# Patient Record
Sex: Female | Born: 1935 | Race: White | Hispanic: No | Smoking: Never smoker
Health system: Southern US, Community
[De-identification: ages and names within clinical notes are randomized; demographics above are authoritative.]

## PROBLEM LIST (undated history)

## (undated) DIAGNOSIS — F419 Anxiety disorder, unspecified: Secondary | ICD-10-CM

## (undated) DIAGNOSIS — S060X9A Concussion with loss of consciousness of unspecified duration, initial encounter: Secondary | ICD-10-CM

## (undated) DIAGNOSIS — I319 Disease of pericardium, unspecified: Secondary | ICD-10-CM

## (undated) DIAGNOSIS — C801 Malignant (primary) neoplasm, unspecified: Secondary | ICD-10-CM

## (undated) DIAGNOSIS — R112 Nausea with vomiting, unspecified: Secondary | ICD-10-CM

## (undated) DIAGNOSIS — E069 Thyroiditis, unspecified: Secondary | ICD-10-CM

## (undated) DIAGNOSIS — Z9889 Other specified postprocedural states: Secondary | ICD-10-CM

## (undated) DIAGNOSIS — M1990 Unspecified osteoarthritis, unspecified site: Secondary | ICD-10-CM

## (undated) DIAGNOSIS — C50919 Malignant neoplasm of unspecified site of unspecified female breast: Secondary | ICD-10-CM

## (undated) DIAGNOSIS — S060XAA Concussion with loss of consciousness status unknown, initial encounter: Secondary | ICD-10-CM

## (undated) DIAGNOSIS — E785 Hyperlipidemia, unspecified: Secondary | ICD-10-CM

## (undated) DIAGNOSIS — M810 Age-related osteoporosis without current pathological fracture: Secondary | ICD-10-CM

## (undated) HISTORY — PX: ABDOMINAL SURGERY: SHX537

## (undated) HISTORY — PX: TUBAL LIGATION: SHX77

## (undated) HISTORY — PX: BREAST LUMPECTOMY: SHX2

## (undated) HISTORY — PX: APPENDECTOMY: SHX54

## (undated) HISTORY — PX: NASAL SINUS SURGERY: SHX719

## (undated) HISTORY — PX: ABDOMINAL HYSTERECTOMY: SHX81

## (undated) HISTORY — PX: FISSURECTOMY: SHX5244

---

## 1959-03-29 DIAGNOSIS — I319 Disease of pericardium, unspecified: Secondary | ICD-10-CM

## 1959-03-29 HISTORY — DX: Disease of pericardium, unspecified: I31.9

## 1969-03-28 DIAGNOSIS — E069 Thyroiditis, unspecified: Secondary | ICD-10-CM

## 1969-03-28 HISTORY — DX: Thyroiditis, unspecified: E06.9

## 1988-03-28 HISTORY — PX: NASAL SEPTUM SURGERY: SHX37

## 2000-11-03 ENCOUNTER — Ambulatory Visit (HOSPITAL_COMMUNITY): Admission: RE | Admit: 2000-11-03 | Discharge: 2000-11-03 | Payer: Self-pay | Admitting: Internal Medicine

## 2001-01-09 ENCOUNTER — Ambulatory Visit (HOSPITAL_COMMUNITY): Admission: RE | Admit: 2001-01-09 | Discharge: 2001-01-09 | Payer: Self-pay | Admitting: Internal Medicine

## 2001-01-09 ENCOUNTER — Encounter: Payer: Self-pay | Admitting: Internal Medicine

## 2002-01-23 ENCOUNTER — Ambulatory Visit (HOSPITAL_COMMUNITY): Admission: RE | Admit: 2002-01-23 | Discharge: 2002-01-23 | Payer: Self-pay | Admitting: Internal Medicine

## 2002-01-23 ENCOUNTER — Encounter: Payer: Self-pay | Admitting: Internal Medicine

## 2002-07-24 ENCOUNTER — Ambulatory Visit (HOSPITAL_COMMUNITY): Admission: RE | Admit: 2002-07-24 | Discharge: 2002-07-24 | Payer: Self-pay | Admitting: Internal Medicine

## 2002-07-24 ENCOUNTER — Encounter: Payer: Self-pay | Admitting: Internal Medicine

## 2002-08-08 ENCOUNTER — Other Ambulatory Visit: Admission: RE | Admit: 2002-08-08 | Discharge: 2002-08-08 | Payer: Self-pay | Admitting: Dermatology

## 2003-03-12 ENCOUNTER — Ambulatory Visit (HOSPITAL_COMMUNITY): Admission: RE | Admit: 2003-03-12 | Discharge: 2003-03-12 | Payer: Self-pay | Admitting: Internal Medicine

## 2003-11-01 ENCOUNTER — Inpatient Hospital Stay (HOSPITAL_COMMUNITY): Admission: EM | Admit: 2003-11-01 | Discharge: 2003-11-04 | Payer: Self-pay | Admitting: Emergency Medicine

## 2003-11-13 ENCOUNTER — Ambulatory Visit (HOSPITAL_COMMUNITY): Admission: RE | Admit: 2003-11-13 | Discharge: 2003-11-13 | Payer: Self-pay | Admitting: *Deleted

## 2004-03-17 ENCOUNTER — Ambulatory Visit (HOSPITAL_COMMUNITY): Admission: RE | Admit: 2004-03-17 | Discharge: 2004-03-17 | Payer: Self-pay | Admitting: Internal Medicine

## 2004-07-29 ENCOUNTER — Ambulatory Visit (HOSPITAL_COMMUNITY): Admission: RE | Admit: 2004-07-29 | Discharge: 2004-07-29 | Payer: Self-pay | Admitting: Internal Medicine

## 2005-03-29 ENCOUNTER — Ambulatory Visit (HOSPITAL_COMMUNITY): Admission: RE | Admit: 2005-03-29 | Discharge: 2005-03-29 | Payer: Self-pay | Admitting: Internal Medicine

## 2006-03-30 ENCOUNTER — Ambulatory Visit (HOSPITAL_COMMUNITY): Admission: RE | Admit: 2006-03-30 | Discharge: 2006-03-30 | Payer: Self-pay | Admitting: Internal Medicine

## 2006-04-07 ENCOUNTER — Encounter: Admission: RE | Admit: 2006-04-07 | Discharge: 2006-04-07 | Payer: Self-pay | Admitting: Internal Medicine

## 2006-04-27 ENCOUNTER — Ambulatory Visit (HOSPITAL_COMMUNITY): Admission: RE | Admit: 2006-04-27 | Discharge: 2006-04-27 | Payer: Self-pay | Admitting: Internal Medicine

## 2007-03-29 HISTORY — PX: LAPAROSCOPIC DISTAL PANCREATECTOMY: SHX1922

## 2007-05-10 ENCOUNTER — Ambulatory Visit (HOSPITAL_COMMUNITY): Admission: RE | Admit: 2007-05-10 | Discharge: 2007-05-10 | Payer: Self-pay | Admitting: Internal Medicine

## 2008-05-14 ENCOUNTER — Ambulatory Visit (HOSPITAL_COMMUNITY): Admission: RE | Admit: 2008-05-14 | Discharge: 2008-05-14 | Payer: Self-pay | Admitting: Internal Medicine

## 2009-05-18 ENCOUNTER — Ambulatory Visit (HOSPITAL_COMMUNITY): Admission: RE | Admit: 2009-05-18 | Discharge: 2009-05-18 | Payer: Self-pay | Admitting: Internal Medicine

## 2009-08-05 ENCOUNTER — Ambulatory Visit (HOSPITAL_COMMUNITY): Admission: RE | Admit: 2009-08-05 | Discharge: 2009-08-05 | Payer: Self-pay | Admitting: Internal Medicine

## 2009-09-14 ENCOUNTER — Encounter: Admission: RE | Admit: 2009-09-14 | Discharge: 2009-09-14 | Payer: Self-pay | Admitting: General Surgery

## 2010-04-18 ENCOUNTER — Encounter: Payer: Self-pay | Admitting: Internal Medicine

## 2010-05-31 ENCOUNTER — Other Ambulatory Visit (HOSPITAL_COMMUNITY): Payer: Self-pay | Admitting: Internal Medicine

## 2010-05-31 DIAGNOSIS — Z139 Encounter for screening, unspecified: Secondary | ICD-10-CM

## 2010-06-03 ENCOUNTER — Ambulatory Visit (HOSPITAL_COMMUNITY)
Admission: RE | Admit: 2010-06-03 | Discharge: 2010-06-03 | Disposition: A | Discharge status: Home / Self Care | Payer: Medicare Other | Source: Ambulatory Visit | Attending: Internal Medicine | Admitting: Internal Medicine

## 2010-06-03 DIAGNOSIS — Z139 Encounter for screening, unspecified: Secondary | ICD-10-CM

## 2010-06-03 DIAGNOSIS — Z1231 Encounter for screening mammogram for malignant neoplasm of breast: Secondary | ICD-10-CM | POA: Insufficient documentation

## 2010-08-13 NOTE — Discharge Summary (Signed)
NAME:  Caitlin Alexander, Caitlin Alexander                          ACCOUNT NO.:  [id number]   MEDICAL RECORD NO.:  [id number]                   PATIENT TYPE:  INP   LOCATION:  A211                                 FACILITY:  [facility]   PHYSICIAN:  Kingsley Callander. Ouida Sills, M.D.                  DATE OF BIRTH:  [birth year]   DATE OF ADMISSION:  10/31/2003  DATE OF DISCHARGE:  11/04/2003                                 DISCHARGE SUMMARY   DISCHARGE DIAGNOSES:  1. Palpitations.  2. Abnormal electrocardiogram.  3. Hyperlipidemia.  4. Anxiety.  5. Gastroesophageal reflux disease.  6. Osteoporosis.   PROCEDURE:  Echocardiogram.   HOSPITAL COURSE:  This patient is a 75 year old white female who presented  to the emergency room with heart palpitations. She was found to have an  abnormal EKG. She was in normal sinus rhythm, but had marked anteroseptal T-  wave inversions. She was hospitalized in a monitored setting. Cardiac  enzymes were negative. She was treated with metoprolol. She was seen in  cardiology consultation by Dr. Wendee Copp.  She had previously had a history  of pericarditis. She underwent an echocardiogram which revealed normal left  ventricular function and no pericardial effusion. She was set up for an  outpatient and was felt to be stable for discharge on November 04, 2003.   It is felt that her EKG changes are her baseline.   She has a significant degree of anxiety. She has been treated with lorazepam  chronically. She was euthyroid with a TSH of 3.3.   DISCHARGE MEDICATIONS:  1. Lorazepam 2 mg t.i.d.  2. Mevacor 20 mg q.h.s.  3. Prevacid and Reglan p.r.n.  4. Viactiv b.i.d.  5. Colace b.i.d.  6. Lopressor 50 mg one-half b.i.d. p.r.n.   FOLLOWUP:  The patient will be seen in my office in two weeks. She will be  set up for an outpatient stress test.     ___________________________________________                                         Kingsley Callander. Ouida Sills, M.D.   ROF/MEDQ  D:  11/06/2003  T:  11/06/2003   Job:  [id number]

## 2010-08-13 NOTE — Procedures (Signed)
NAME:  Caitlin Alexander, Caitlin Alexander                          ACCOUNT NO.:  [id number]   MEDICAL RECORD NO.:  [id number]                   PATIENT TYPE:  REC   LOCATION:  RAD                                  FACILITY:  [facility]   PHYSICIAN:  Dani Gobble, MD                    DATE OF BIRTH:  June 21, 1935   DATE OF PROCEDURE:  11/13/2003  DATE OF DISCHARGE:                                    STRESS TEST   PROCEDURE:  Stress portion of an adenosine Cardiolite.   REFERRING PHYSICIAN:  Kingsley Callander. Ouida Sills, M.D.   SURGEON:  Sherral Hammers, M.D.   INDICATIONS:  Ms. Olinde has had __________ palpitations with some chest  heaviness as well.  She is referred for ischemic workup today.   Baseline 12-lead EKG reveals normal sinus rhythm at approximately 80  beats/minute.  She has subtle mild T wave inversion in the precordial leads  particularly in V4-V6 which are unchanged from prior several years ago.  Baseline blood pressure 130/80 mmHg.   The patient was infused with adenosine per protocol.  She experienced mild  abdominal sensation.  This resolved by testing.  EKG revealed no further  changes during her procedure.   IMPRESSION:  1. Clinically negative for angina.  2. EKG negative for ischemia.  3. Scintigraphic images are pending.      ___________________________________________                                            Dani Gobble, MD   AB/MEDQ  D:  11/13/2003  T:  11/14/2003  Job:  [id number]   cc:   Kingsley Callander. Ouida Sills, M.D.  [address]  [place]  [place] [address]  Fax: [phone]   Dani Gobble, MD  Fax: [phone]

## 2010-08-13 NOTE — Procedures (Signed)
NAME:  Caitlin Alexander, Caitlin Alexander                          ACCOUNT NO.:  [id number]   MEDICAL RECORD NO.:  [id number]                   PATIENT TYPE:  INP   LOCATION:  A211                                 FACILITY:  [facility]   PHYSICIAN:  Dani Gobble, MD                    DATE OF BIRTH:  1936-01-06   DATE OF PROCEDURE:  11/04/2003  DATE OF DISCHARGE:                                  ECHOCARDIOGRAM   INDICATIONS:  Ms. Longmire is a 75 year old female with GERD and abnormal EKG  and a remote history of pericarditis who is admitted with palpitations.   Technical quality of the study is adequate.   The aorta is within normal limits at 2.7 cm.   The left atrium is within normal limits at 3.8 cm.   The intraventricular septum is mildly thickened at 1.3 cm. The posterior  wall is within normal limits at 0.9 cm.   The aortic valve appeared to be structurally normal with normal leaflet  excursion. No significant aortic insufficiency is noted.   The aortic valve leaflet excursion was normal.   The mitral valve also appeared structurally normal with no mitral valve  prolapse noted. No significant mitral regurgitation was noted. Doppler  interrogation of the mitral valve was within normal limits.   Pulmonic valve was incompletely visualized.   Tricuspid valve appeared grossly structurally normal with trivial tricuspid  regurgitation noted.   Left ventricle was normal in size with normal overall left ventricular  systolic function and no regional wall motion abnormalities noted. The right  atrium and right ventricle appeared normal in size with normal right  ventricular systolic function. No pericardial effusion was appreciated on  this study.   IMPRESSION:  1. Mild asymmetric septal hypertrophy.  2. No significant valvular abnormality appreciated.  3. Trivial tricuspid regurgitation.  4. Normal left ventricular size and systolic function without regional wall     motion abnormalities noted.  5.  No pericardial effusion.      ___________________________________________                                            Dani Gobble, MD   AB/MEDQ  D:  11/04/2003  T:  11/04/2003  Job:  [id number]

## 2010-08-13 NOTE — H&P (Signed)
NAME:  Caitlin Alexander, Caitlin Alexander                          ACCOUNT NO.:  [id number]   MEDICAL RECORD NO.:  [id number]                   PATIENT TYPE:  INP   LOCATION:  IC10                                 FACILITY:  [facility]   PHYSICIAN:  Melvyn Novas, MD        DATE OF BIRTH:  04-10-1935   DATE OF ADMISSION:  10/31/2003  DATE OF DISCHARGE:                                HISTORY & PHYSICAL   HISTORY OF PRESENT ILLNESS:  The patient is a 75 year old white female  patient of Dr. Alonza Smoker with severe hyperlipidemia over 300 currently on  Mevacor at 22 with a history of anxiety who apparently had flip-flopping of  her heart for the past 4-5 days, it became a little worse today.  She was  seen in the ER, was found to be in sinus rhythm and however, had profound ST  and T wave abnormalities of the anterior precordial leads, possibly  consistent with either anterior ischemia or posterior wall reciprocal  changes.  She specifically denied anginal chest pain, orthopnea, or PND.  She did notice some increased gas over the last few days and indigestion but  specifically denies angina or angina equivalence.  Her initial cardiac  enzymes were negative and she is admitted for the profound EKG changes in  the face of severe hyperlipidemia over time.   PAST MEDICAL HISTORY:  1. Hyperlipidemia.  2. GERD.  3. Anxiety.  4. Depression.  5. Diverticulosis.  6. Osteoporosis.   PAST SURGICAL HISTORY:  1. Appendectomy.  2. Vaginal hysterectomy.  3. Anal fissure.  4. Nasal septum repair.   ALLERGIES:  She has allergies to PENICILLIN, SULFUR, and MACRODANTIN.   CURRENT MEDICINES:  1. Mevacor 20.  2. Ativan 2 mg t.i.d.   SOCIAL HISTORY:  She is a nonsmoker.   FAMILY HISTORY:  She had six siblings; one brother died of an MI at 50, the  other six siblings are alive.  No early heart attacks.  Her father died at  26 and mother died at 21.   PHYSICAL EXAMINATION:  VITAL SIGNS:  Blood pressure 130/78,  pulse of 61,  respiratory rate is 18, O2 saturation is 99%.  EYES:  PERRLA.  Extraocular movements intact.  Sclerae clear.  Conjunctivae  pink.  NECK:  No jugular venous distention, no carotid bruits, no thyromegaly or  thyroid bruits.  LUNGS:  Clear to A&P.  No rales, wheezes, or rhonchi.  HEART:  Regular rhythm.  No murmurs, gallops, heaves, thrills, or rubs.  ABDOMEN:  Soft, nontender, bowel sounds normoactive.  No guarding or  rebound, no mass, no megaly.  EXTREMITIES:  No clubbing, cyanosis, or edema.  NEUROLOGIC:  Cranial nerves II-XII are grossly intact.  Patient moves all  four extremities.  Plantars are downgoing.   IMPRESSION:  1. Intermittent palpitations, possible supraventricular tachycardia versus     paroxysmal atrial fibrillation.  2. Profound ST and T wave abnormalities which are in the anterior  precordial     leads.  Rule out ischemia.  3. Severe hyperlipidemia.   PLAN:  Plan is to admit to ICU, place on Lovenox 1 mg/kg subcu q.12h.,  aspirin 325 once daily, IV nitroglycerin and Lopressor 25 b.i.d.  We will  obtain lipid profile in the morning and schedule a Cardiolite scan as her  last one was done 13 years ago.     ___________________________________________                                         Melvyn Novas, MD   RMD/MEDQ  D:  11/01/2003  T:  11/01/2003  Job:  [id number]

## 2011-07-18 ENCOUNTER — Other Ambulatory Visit (HOSPITAL_COMMUNITY): Payer: Self-pay | Admitting: Internal Medicine

## 2011-07-18 DIAGNOSIS — Z139 Encounter for screening, unspecified: Secondary | ICD-10-CM

## 2011-07-19 ENCOUNTER — Ambulatory Visit (HOSPITAL_COMMUNITY)
Admission: RE | Admit: 2011-07-19 | Discharge: 2011-07-19 | Disposition: A | Discharge status: Home / Self Care | Payer: Medicare Other | Source: Ambulatory Visit | Attending: Internal Medicine | Admitting: Internal Medicine

## 2011-07-19 DIAGNOSIS — Z139 Encounter for screening, unspecified: Secondary | ICD-10-CM

## 2011-07-19 DIAGNOSIS — Z1231 Encounter for screening mammogram for malignant neoplasm of breast: Secondary | ICD-10-CM | POA: Insufficient documentation

## 2011-10-20 ENCOUNTER — Other Ambulatory Visit (HOSPITAL_COMMUNITY): Payer: Self-pay | Admitting: Internal Medicine

## 2011-10-20 DIAGNOSIS — D490 Neoplasm of unspecified behavior of digestive system: Secondary | ICD-10-CM

## 2011-10-26 ENCOUNTER — Ambulatory Visit (HOSPITAL_COMMUNITY)
Admission: RE | Admit: 2011-10-26 | Discharge: 2011-10-26 | Disposition: A | Discharge status: Home / Self Care | Payer: Medicare Other | Source: Ambulatory Visit | Attending: Internal Medicine | Admitting: Internal Medicine

## 2011-10-26 ENCOUNTER — Encounter (HOSPITAL_COMMUNITY): Payer: Self-pay | Admitting: *Deleted

## 2011-10-26 ENCOUNTER — Inpatient Hospital Stay (HOSPITAL_COMMUNITY)
Admission: EM | Admit: 2011-10-26 | Discharge: 2011-11-09 | DRG: 329 | Disposition: A | Discharge status: Other Acute Inpatient Hospital | Payer: Medicare Other | Attending: General Surgery | Admitting: General Surgery

## 2011-10-26 DIAGNOSIS — E876 Hypokalemia: Secondary | ICD-10-CM | POA: Diagnosis not present

## 2011-10-26 DIAGNOSIS — E871 Hypo-osmolality and hyponatremia: Secondary | ICD-10-CM | POA: Diagnosis not present

## 2011-10-26 DIAGNOSIS — F411 Generalized anxiety disorder: Secondary | ICD-10-CM | POA: Diagnosis present

## 2011-10-26 DIAGNOSIS — Z90410 Acquired total absence of pancreas: Secondary | ICD-10-CM

## 2011-10-26 DIAGNOSIS — D490 Neoplasm of unspecified behavior of digestive system: Secondary | ICD-10-CM | POA: Insufficient documentation

## 2011-10-26 DIAGNOSIS — K5900 Constipation, unspecified: Secondary | ICD-10-CM | POA: Diagnosis present

## 2011-10-26 DIAGNOSIS — Y832 Surgical operation with anastomosis, bypass or graft as the cause of abnormal reaction of the patient, or of later complication, without mention of misadventure at the time of the procedure: Secondary | ICD-10-CM | POA: Diagnosis not present

## 2011-10-26 DIAGNOSIS — E785 Hyperlipidemia, unspecified: Secondary | ICD-10-CM | POA: Diagnosis present

## 2011-10-26 DIAGNOSIS — M810 Age-related osteoporosis without current pathological fracture: Secondary | ICD-10-CM | POA: Diagnosis present

## 2011-10-26 DIAGNOSIS — Z882 Allergy status to sulfonamides status: Secondary | ICD-10-CM

## 2011-10-26 DIAGNOSIS — K56609 Unspecified intestinal obstruction, unspecified as to partial versus complete obstruction: Principal | ICD-10-CM | POA: Diagnosis present

## 2011-10-26 DIAGNOSIS — D72829 Elevated white blood cell count, unspecified: Secondary | ICD-10-CM | POA: Diagnosis not present

## 2011-10-26 DIAGNOSIS — R933 Abnormal findings on diagnostic imaging of other parts of digestive tract: Secondary | ICD-10-CM | POA: Insufficient documentation

## 2011-10-26 DIAGNOSIS — I319 Disease of pericardium, unspecified: Secondary | ICD-10-CM | POA: Diagnosis present

## 2011-10-26 DIAGNOSIS — J189 Pneumonia, unspecified organism: Secondary | ICD-10-CM | POA: Diagnosis not present

## 2011-10-26 DIAGNOSIS — S98119A Complete traumatic amputation of unspecified great toe, initial encounter: Secondary | ICD-10-CM

## 2011-10-26 DIAGNOSIS — Z881 Allergy status to other antibiotic agents status: Secondary | ICD-10-CM

## 2011-10-26 DIAGNOSIS — Y9210 Unspecified residential institution as the place of occurrence of the external cause: Secondary | ICD-10-CM | POA: Diagnosis not present

## 2011-10-26 DIAGNOSIS — Z9089 Acquired absence of other organs: Secondary | ICD-10-CM | POA: Insufficient documentation

## 2011-10-26 DIAGNOSIS — Z888 Allergy status to other drugs, medicaments and biological substances status: Secondary | ICD-10-CM

## 2011-10-26 DIAGNOSIS — Z90411 Acquired partial absence of pancreas: Secondary | ICD-10-CM | POA: Insufficient documentation

## 2011-10-26 DIAGNOSIS — Z09 Encounter for follow-up examination after completed treatment for conditions other than malignant neoplasm: Secondary | ICD-10-CM | POA: Insufficient documentation

## 2011-10-26 DIAGNOSIS — K560 Paralytic ileus: Secondary | ICD-10-CM | POA: Diagnosis not present

## 2011-10-26 DIAGNOSIS — K929 Disease of digestive system, unspecified: Secondary | ICD-10-CM | POA: Diagnosis not present

## 2011-10-26 HISTORY — DX: Anxiety disorder, unspecified: F41.9

## 2011-10-26 HISTORY — DX: Age-related osteoporosis without current pathological fracture: M81.0

## 2011-10-26 HISTORY — DX: Hyperlipidemia, unspecified: E78.5

## 2011-10-26 HISTORY — DX: Unspecified osteoarthritis, unspecified site: M19.90

## 2011-10-26 LAB — COMPREHENSIVE METABOLIC PANEL
ALT: 143 U/L — ABNORMAL HIGH (ref 0–35)
Alkaline Phosphatase: 221 U/L — ABNORMAL HIGH (ref 39–117)
BUN: 10 mg/dL (ref 6–23)
CO2: 25 mEq/L (ref 19–32)
Calcium: 10 mg/dL (ref 8.4–10.5)
Chloride: 91 mEq/L — ABNORMAL LOW (ref 96–112)
Creatinine, Ser: 0.75 mg/dL (ref 0.50–1.10)
GFR calc non Af Amer: 81 mL/min — ABNORMAL LOW (ref >90)
Glucose, Bld: 135 mg/dL — ABNORMAL HIGH (ref 70–99)
Potassium: 3.9 mEq/L (ref 3.5–5.1)

## 2011-10-26 LAB — CBC WITH DIFFERENTIAL/PLATELET
Basophils Relative: 0 % (ref 0–1)
Eosinophils Relative: 0 % (ref 0–5)
Hemoglobin: 13.7 g/dL (ref 12.0–15.0)
Lymphocytes Relative: 13 % (ref 12–46)
Lymphs Abs: 2.2 10*3/uL (ref 0.7–4.0)
MCH: 31.2 pg (ref 26.0–34.0)
MCHC: 34.5 g/dL (ref 30.0–36.0)
Monocytes Relative: 8 % (ref 3–12)
RBC: 4.39 MIL/uL (ref 3.87–5.11)

## 2011-10-26 MED ORDER — SODIUM CHLORIDE 0.9 % IV BOLUS (SEPSIS)
1000.0000 mL | Freq: Once | INTRAVENOUS | Status: AC
  Administered 2011-10-26: 1000 mL via INTRAVENOUS

## 2011-10-26 MED ORDER — PANTOPRAZOLE SODIUM 40 MG IV SOLR
40.0000 mg | Freq: Every day | INTRAVENOUS | Status: DC
  Administered 2011-10-27 – 2011-11-04 (×9): 40 mg via INTRAVENOUS
  Filled 2011-10-26 (×9): qty 40

## 2011-10-26 MED ORDER — PANTOPRAZOLE SODIUM 40 MG IV SOLR
40.0000 mg | Freq: Once | INTRAVENOUS | Status: AC
  Administered 2011-10-26: 40 mg via INTRAVENOUS
  Filled 2011-10-26: qty 40

## 2011-10-26 MED ORDER — LACTATED RINGERS IV SOLN
INTRAVENOUS | Status: DC
  Administered 2011-10-26 – 2011-11-02 (×14): via INTRAVENOUS
  Administered 2011-11-02: 1000 mL via INTRAVENOUS
  Administered 2011-11-03 (×2): via INTRAVENOUS

## 2011-10-26 MED ORDER — ONDANSETRON HCL 4 MG/2ML IJ SOLN
4.0000 mg | Freq: Four times a day (QID) | INTRAMUSCULAR | Status: DC | PRN
  Administered 2011-10-26 – 2011-10-31 (×4): 4 mg via INTRAVENOUS
  Filled 2011-10-26 (×4): qty 2

## 2011-10-26 MED ORDER — ENOXAPARIN SODIUM 40 MG/0.4ML ~~LOC~~ SOLN
40.0000 mg | SUBCUTANEOUS | Status: DC
  Administered 2011-10-26 – 2011-11-07 (×13): 40 mg via SUBCUTANEOUS
  Filled 2011-10-26 (×13): qty 0.4

## 2011-10-26 MED ORDER — IOHEXOL 300 MG/ML  SOLN
100.0000 mL | Freq: Once | INTRAMUSCULAR | Status: AC | PRN
  Administered 2011-10-26: 100 mL via INTRAVENOUS

## 2011-10-26 MED ORDER — ONDANSETRON HCL 4 MG/2ML IJ SOLN
4.0000 mg | Freq: Once | INTRAMUSCULAR | Status: AC
  Administered 2011-10-26: 4 mg via INTRAVENOUS
  Filled 2011-10-26: qty 2

## 2011-10-26 MED ORDER — MORPHINE SULFATE 2 MG/ML IJ SOLN
1.0000 mg | INTRAMUSCULAR | Status: DC | PRN
  Administered 2011-10-26 – 2011-10-27 (×3): 4 mg via INTRAVENOUS
  Administered 2011-10-27: 2 mg via INTRAVENOUS
  Administered 2011-10-27 – 2011-10-28 (×3): 4 mg via INTRAVENOUS
  Administered 2011-10-29 – 2011-10-31 (×2): 2 mg via INTRAVENOUS
  Filled 2011-10-26 (×2): qty 2
  Filled 2011-10-26: qty 1
  Filled 2011-10-26 (×5): qty 2
  Filled 2011-10-26: qty 1
  Filled 2011-10-26: qty 2

## 2011-10-26 MED ORDER — HYDROMORPHONE HCL PF 1 MG/ML IJ SOLN
1.0000 mg | Freq: Once | INTRAMUSCULAR | Status: AC
  Administered 2011-10-26: 1 mg via INTRAVENOUS
  Filled 2011-10-26: qty 1

## 2011-10-26 NOTE — ED Provider Notes (Signed)
History     CSN: [id number]  Arrival date & time 10/26/11  1857   First MD Initiated Contact with Patient 10/26/11 1952      Chief Complaint  Patient presents with  . Abdominal Pain    (Consider location/radiation/quality/duration/timing/severity/associated sxs/prior treatment) Patient is a 76 y.o. female presenting with abdominal pain. The history is provided by the patient (pt complains of abd pain and vomiting). No language interpreter was used.  Abdominal Pain The primary symptoms of the illness include abdominal pain and vomiting. The primary symptoms of the illness do not include fatigue or diarrhea. The current episode started yesterday. The onset of the illness was gradual. The problem has not changed since onset. Associated with: nothing. The patient states that she believes she is currently not pregnant. The patient has not had a change in bowel habit. Additional symptoms associated with the illness include urgency. Symptoms associated with the illness do not include hematuria, frequency or back pain. Significant associated medical issues do not include sickle cell disease.    History reviewed. No pertinent past medical history.  Past Surgical History  Procedure Date  . Abdominal surgery     No family history on file.  History  Substance Use Topics  . Smoking status: Never Smoker   . Smokeless tobacco: Not on file  . Alcohol Use: No    OB History    Grav Para Term Preterm Abortions TAB SAB Ect Mult Living                  Review of Systems  Constitutional: Negative for fatigue.  HENT: Negative for congestion, sinus pressure and ear discharge.   Eyes: Negative for discharge.  Respiratory: Negative for cough.   Cardiovascular: Negative for chest pain.  Gastrointestinal: Positive for vomiting and abdominal pain. Negative for diarrhea.  Genitourinary: Positive for urgency. Negative for frequency and hematuria.  Musculoskeletal: Negative for back pain.  Skin:  Negative for rash.  Neurological: Negative for seizures and headaches.  Hematological: Negative.   Psychiatric/Behavioral: Negative for hallucinations.    Allergies  Macrodantin; Promethazine; Sulfa antibiotics; and Suprax  Home Medications   Current Outpatient Rx  Name Route Sig Dispense Refill  . ACETAMINOPHEN 500 MG PO TABS Oral Take 500 mg by mouth daily as needed. For pain    . VICODIN PO Oral Take 0.5-1 tablets by mouth once as needed. For pain    . LANSOPRAZOLE 30 MG PO CPDR Oral Take 30 mg by mouth once as needed.    Marland Kitchen LORAZEPAM 2 MG PO TABS Oral Take 2 mg by mouth 3 (three) times daily.     Marland Kitchen LOVASTATIN 20 MG PO TABS Oral Take 20 mg by mouth every evening.     Marland Kitchen POLYETHYLENE GLYCOL 3350 PO PACK Oral Take 17 g by mouth every morning.      BP 118/71  Pulse 65  Temp 98.7 F (37.1 C) (Oral)  Resp 18  Ht 5\' 2"  (1.575 m)  Wt 135 lb (61.236 kg)  BMI 24.69 kg/m2  SpO2 95%  Physical Exam  Constitutional: She is oriented to person, place, and time. She appears well-developed.  HENT:  Head: Normocephalic and atraumatic.  Eyes: Conjunctivae and EOM are normal. No scleral icterus.  Neck: Neck supple. No thyromegaly present.  Cardiovascular: Normal rate and regular rhythm.  Exam reveals no gallop and no friction rub.   No murmur heard. Pulmonary/Chest: No stridor. She has no wheezes. She has no rales. She exhibits no tenderness.  Abdominal: She exhibits no distension. There is tenderness. There is no rebound.  Musculoskeletal: Normal range of motion. She exhibits no edema.  Lymphadenopathy:    She has no cervical adenopathy.  Neurological: She is oriented to person, place, and time. Coordination normal.  Skin: No rash noted. No erythema.  Psychiatric: She has a normal mood and affect. Her behavior is normal.    ED Course  Procedures (including critical care time)  Labs Reviewed  CBC WITH DIFFERENTIAL - Abnormal; Notable for the following:    WBC 16.3 (*)      Neutrophils Relative 79 (*)     Neutro Abs 12.9 (*)     Monocytes Absolute 1.2 (*)     All other components within normal limits  COMPREHENSIVE METABOLIC PANEL - Abnormal; Notable for the following:    Sodium 129 (*)     Chloride 91 (*)     Glucose, Bld 135 (*)     AST 50 (*)     ALT 143 (*)     Alkaline Phosphatase 221 (*)     GFR calc non Af Amer 81 (*)     All other components within normal limits  LIPASE, BLOOD   Ct Abdomen W Contrast  10/26/2011  *RADIOLOGY REPORT*  Clinical Data: Follow-up distal pancreatectomy and splenectomy.  CT ABDOMEN WITH CONTRAST  Technique:  Multidetector CT imaging of the abdomen was performed following the standard protocol during bolus administration of intravenous contrast.  Contrast: OMNIPAQUE IOHEXOL 300 MG/ML  SOLN  Comparison: 08/05/2009.  Findings: The lung bases are clear.  No pleural effusion or pulmonary nodule.  There is a stable hepatic cyst.  No worrisome hepatic lesions or intrahepatic biliary dilatation.  The gallbladder is normal.  No common bile duct dilatation.  Stable surgical changes from a partial distal pancreatectomy.  No complicating features are demonstrated.  The spleen has also been surgically removed.  The adrenal glands and kidneys are unremarkable and stable.  The stomach as well distended.  No abnormalities are seen.  The third portion of the duodenum demonstrates moderate wall thickening and mild inflammatory change.  Findings suggest focal duodenitis.  The small bowel demonstrates moderate dilatation with normal/decompressed appearing distal small bowel loops.  Findings could be due to a mid distal small bowel obstruction.  The pelvis is not included.  The colon relatively decompressed.  The aorta is normal in caliber.  No dissection.  The bony structures unremarkable.  IMPRESSION:  1.  Stable surgical changes from a distal pancreatectomy and splenectomy. 2.  Wall thickening and inflammation of the third portion of the duodenum  suggesting duodenitis. 3.  Dilated small bowel loops with normal/decompressed distal small bowel loops suggesting a small bowel obstruction.  Transition point is not identified but the pelvis is not included.  Would recommend clinical correlation and follow-up radiographs is indicated.  Original Report Authenticated By: P. Loralie Champagne, M.D.     1. SBO (small bowel obstruction)       Date: 10/26/2011  Rate:99  Rhythm: normal sinus rhythm  QRS Axis: left  Intervals: normal  ST/T Wave abnormalities: nonspecific ST changes  Conduction Disutrbances:none  Narrative Interpretation:   Old EKG Reviewed: none available   MDM  The chart was scribed for me under my direct supervision.  I personally performed the history, physical, and medical decision making and all procedures in the evaluation of this patient.Benny Lennert, MD 10/26/11 2128

## 2011-10-26 NOTE — ED Notes (Addendum)
C/o diffuse abd pain, describes as pressure being sharp at times x 1 week.  Pt saw her PCP on Thursday for same and had CT abd/pelvis done today.  Reports onset of n/v last night.

## 2011-10-26 NOTE — ED Notes (Signed)
C/o nausea; actively vomiting; medicated per orders.

## 2011-10-26 NOTE — ED Notes (Signed)
States she has abdominal pain and vomiting for a week, states she had a ct scan of abdomen earlier today, states she is vomiting now and unable to keep fluids down

## 2011-10-27 ENCOUNTER — Encounter (HOSPITAL_COMMUNITY): Payer: Self-pay | Admitting: Unknown Physician Specialty

## 2011-10-27 LAB — CBC
MCH: 30.8 pg (ref 26.0–34.0)
MCHC: 33.8 g/dL (ref 30.0–36.0)
RDW: 13.3 % (ref 11.5–15.5)

## 2011-10-27 LAB — BASIC METABOLIC PANEL
BUN: 11 mg/dL (ref 6–23)
Creatinine, Ser: 0.78 mg/dL (ref 0.50–1.10)
GFR calc Af Amer: >90 mL/min (ref >90)
GFR calc non Af Amer: 80 mL/min — ABNORMAL LOW (ref >90)

## 2011-10-27 MED ORDER — METOCLOPRAMIDE HCL 5 MG/ML IJ SOLN
10.0000 mg | Freq: Once | INTRAMUSCULAR | Status: AC
  Administered 2011-10-27: 10 mg via INTRAVENOUS
  Filled 2011-10-27: qty 2

## 2011-10-27 MED ORDER — HYDROMORPHONE HCL PF 1 MG/ML IJ SOLN
1.0000 mg | INTRAMUSCULAR | Status: DC | PRN
  Administered 2011-10-27 – 2011-10-31 (×21): 1 mg via INTRAVENOUS
  Filled 2011-10-27 (×8): qty 1
  Filled 2011-10-27: qty 2
  Filled 2011-10-27 (×4): qty 1
  Filled 2011-10-27: qty 2
  Filled 2011-10-27 (×7): qty 1

## 2011-10-27 NOTE — Progress Notes (Signed)
UR Chart Review Completed  

## 2011-10-27 NOTE — H&P (Signed)
Caitlin Alexander is an 76 y.o. female.   Chief Complaint: Nausea vomiting and abdominal pain HPI: Patient states she started developing symptoms approximately one week ago with increasing nausea and episodes of nonbloody emesis. She has had some diffuse colicky abdominal pain as described. Occasionally this is been sharp stabbing. She has had intermittent bowel function over the last week although her last bowel movement was several days ago. She had been worked up as an outpatient by her PCP. A CT of her upper abdomen was obtained and demonstrated evidence of distended loops of small intestine suspicious for small bowel obstruction. She did presents the emergency department after this finding. Her last bowel movement was approximately 2 days ago. Again it was reported as normal with no melena or hematochezia. No history of diarrhea. She has had some nausea and chills associated with the nausea. Nausea is currently better with bowel rest. She is unsure whether she is passing flatus. No changes with urination. No similar symptomatology in the past. 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 She's also had a abdominal hysterectomy and appendectomy.  History reviewed. No pertinent past medical history.  Past Surgical History  Procedure Date  . Abdominal surgery   . Appendectomy   . Abdominal hysterectomy     History reviewed. No pertinent family history. Social History:  reports that she has never smoked. She does not have any smokeless tobacco history on file. She reports that she does not drink alcohol or use illicit drugs.  Allergies:  Allergies  Allergen Reactions  . Macrodantin (Nitrofurantoin Macrocrystal)   . Promethazine   . Sulfa Antibiotics   . Suprax (Cefixime)     Medications Prior to Admission  Medication Sig Dispense Refill  . acetaminophen (TYLENOL) 500 MG tablet Take 500 mg by mouth daily as  needed. For pain      . Hydrocodone-Acetaminophen (VICODIN PO) Take 0.5-1 tablets by mouth once as needed. For pain      . lansoprazole (PREVACID) 30 MG capsule Take 30 mg by mouth once as needed.      Marland Kitchen LORazepam (ATIVAN) 2 MG tablet Take 2 mg by mouth 3 (three) times daily.       Marland Kitchen lovastatin (MEVACOR) 20 MG tablet Take 20 mg by mouth every evening.       . polyethylene glycol (MIRALAX / GLYCOLAX) packet Take 17 g by mouth every morning.        Results for orders placed during the hospital encounter of 10/26/11 (from the past 48 hour(s))  CBC WITH DIFFERENTIAL     Status: Abnormal   Collection Time   10/26/11  7:58 PM      Component Value Range Comment   WBC 16.3 (*) 4.0 - 10.5 K/uL    RBC 4.39  3.87 - 5.11 MIL/uL    Hemoglobin 13.7  12.0 - 15.0 g/dL    HCT 78.2  95.6 - 21.3 %    MCV 90.4  78.0 - 100.0 fL    MCH 31.2  26.0 - 34.0 pg    MCHC 34.5  30.0 - 36.0 g/dL    RDW 08.6  57.8 - 46.9 %    Platelets 396  150 - 400 K/uL    Neutrophils Relative 79 (*) 43 - 77 %    Neutro Abs 12.9 (*) 1.7 - 7.7 K/uL    Lymphocytes Relative 13  12 - 46 %  Lymphs Abs 2.2  0.7 - 4.0 K/uL    Monocytes Relative 8  3 - 12 %    Monocytes Absolute 1.2 (*) 0.1 - 1.0 K/uL    Eosinophils Relative 0  0 - 5 %    Eosinophils Absolute 0.0  0.0 - 0.7 K/uL    Basophils Relative 0  0 - 1 %    Basophils Absolute 0.0  0.0 - 0.1 K/uL   COMPREHENSIVE METABOLIC PANEL     Status: Abnormal   Collection Time   10/26/11  7:58 PM      Component Value Range Comment   Sodium 129 (*) 135 - 145 mEq/L    Potassium 3.9  3.5 - 5.1 mEq/L    Chloride 91 (*) 96 - 112 mEq/L    CO2 25  19 - 32 mEq/L    Glucose, Bld 135 (*) 70 - 99 mg/dL    BUN 10  6 - 23 mg/dL    Creatinine, Ser 4.09  0.50 - 1.10 mg/dL    Calcium 81.1  8.4 - 10.5 mg/dL    Total Protein 8.0  6.0 - 8.3 g/dL    Albumin 3.7  3.5 - 5.2 g/dL    AST 50 (*) 0 - 37 U/L    ALT 143 (*) 0 - 35 U/L    Alkaline Phosphatase 221 (*) 39 - 117 U/L    Total Bilirubin 1.0   0.3 - 1.2 mg/dL    GFR calc non Af Amer 81 (*) >90 mL/min    GFR calc Af Amer >90  >90 mL/min   LIPASE, BLOOD     Status: Normal   Collection Time   10/26/11  7:58 PM      Component Value Range Comment   Lipase 28  11 - 59 U/L   BASIC METABOLIC PANEL     Status: Abnormal   Collection Time   10/27/11  4:52 AM      Component Value Range Comment   Sodium 131 (*) 135 - 145 mEq/L    Potassium 4.6  3.5 - 5.1 mEq/L    Chloride 92 (*) 96 - 112 mEq/L    CO2 28  19 - 32 mEq/L    Glucose, Bld 125 (*) 70 - 99 mg/dL    BUN 11  6 - 23 mg/dL    Creatinine, Ser 9.14  0.50 - 1.10 mg/dL    Calcium 9.6  8.4 - 78.2 mg/dL    GFR calc non Af Amer 80 (*) >90 mL/min    GFR calc Af Amer >90  >90 mL/min   CBC     Status: Abnormal   Collection Time   10/27/11  4:52 AM      Component Value Range Comment   WBC 18.0 (*) 4.0 - 10.5 K/uL    RBC 4.29  3.87 - 5.11 MIL/uL    Hemoglobin 13.2  12.0 - 15.0 g/dL    HCT 95.6  21.3 - 08.6 %    MCV 90.9  78.0 - 100.0 fL    MCH 30.8  26.0 - 34.0 pg    MCHC 33.8  30.0 - 36.0 g/dL    RDW 57.8  46.9 - 62.9 %    Platelets 388  150 - 400 K/uL    Ct Abdomen W Contrast  10/26/2011  *RADIOLOGY REPORT*  Clinical Data: Follow-up distal pancreatectomy and splenectomy.  CT ABDOMEN WITH CONTRAST  Technique:  Multidetector CT imaging of the abdomen was performed following the standard  protocol during bolus administration of intravenous contrast.  Contrast: OMNIPAQUE IOHEXOL 300 MG/ML  SOLN  Comparison: 08/05/2009.  Findings: The lung bases are clear.  No pleural effusion or pulmonary nodule.  There is a stable hepatic cyst.  No worrisome hepatic lesions or intrahepatic biliary dilatation.  The gallbladder is normal.  No common bile duct dilatation.  Stable surgical changes from a partial distal pancreatectomy.  No complicating features are demonstrated.  The spleen has also been surgically removed.  The adrenal glands and kidneys are unremarkable and stable.  The stomach as well  distended.  No abnormalities are seen.  The third portion of the duodenum demonstrates moderate wall thickening and mild inflammatory change.  Findings suggest focal duodenitis.  The small bowel demonstrates moderate dilatation with normal/decompressed appearing distal small bowel loops.  Findings could be due to a mid distal small bowel obstruction.  The pelvis is not included.  The colon relatively decompressed.  The aorta is normal in caliber.  No dissection.  The bony structures unremarkable.  IMPRESSION:  1.  Stable surgical changes from a distal pancreatectomy and splenectomy. 2.  Wall thickening and inflammation of the third portion of the duodenum suggesting duodenitis. 3.  Dilated small bowel loops with normal/decompressed distal small bowel loops suggesting a small bowel obstruction.  Transition point is not identified but the pelvis is not included.  Would recommend clinical correlation and follow-up radiographs is indicated.  Original Report Authenticated By: P. Loralie Champagne, M.D.    Review of Systems  Constitutional: Positive for chills.  HENT: Negative.   Eyes: Negative.   Respiratory: Negative.   Cardiovascular: Negative.   Gastrointestinal: Positive for heartburn, nausea, vomiting, abdominal pain (Diffuse) and constipation. Negative for diarrhea, blood in stool and melena.  Genitourinary: Negative.   Musculoskeletal: Negative.   Skin: Negative.   Neurological: Negative.   Endo/Heme/Allergies: Negative.   Psychiatric/Behavioral: Negative.     Blood pressure 138/69, pulse 65, temperature 98.2 F (36.8 C), temperature source Oral, resp. rate 20, height 5\' 2"  (1.575 m), weight 61.236 kg (135 lb), SpO2 95.00%. Physical Exam  Constitutional: She is oriented to person, place, and time. She appears well-developed and well-nourished. No distress.  HENT:  Head: Normocephalic and atraumatic.  Eyes: Conjunctivae and EOM are normal. Pupils are equal, round, and reactive to light. No  scleral icterus.  Neck: Normal range of motion. Neck supple. No tracheal deviation present. No thyromegaly present.  Cardiovascular: Normal rate and regular rhythm.   Respiratory: Effort normal and breath sounds normal. No respiratory distress. She has no wheezes.  GI: Soft. She exhibits distension (mild). She exhibits no mass. There is tenderness (mild diffuse. No peritoneal signs.). There is no rebound and no guarding.  Lymphadenopathy:    She has no cervical adenopathy.  Neurological: She is alert and oriented to person, place, and time.  Skin: Skin is warm and dry.     Assessment/Plan Small bowel obstruction. Unfortunately patient only had a abdominal CT with no inclusion of the pelvis. My suspicion is to transition point is within the pelvis and given her surgical history this is likely resultant from scar tissue and adhesions from her hysterectomy. I had an extremely long conversation with the patient regarding the pathophysiology of her small bowel obstruction. Patient had difficulty understanding why her MiraLAX would not help her symptomatology. I did discuss again at length indications for urgent surgical intervention. At this time patient does not have any indications to proceed to the operating room we will continue  conservative management with IV fluid hydration, bowel rest, possible nasogastric tube placement, symptomatic control of her nausea, and close observation. Indications to proceed to the operating room urgently were discussed as well as possible need to proceed to the operating room for nonresolution of her symptomatology. At this point I do not feel that a pelvic CT is warranted given her clinical presentation of a bowel obstruction. Should she continue to have nonresolution but no exacerbation of her symptoms or findings I do feel at that time either he small bowel follow-through or a completion of her CT to further evaluate the pelvis would be warranted. At this time again we'll  continue conservative management.  Orabelle Rylee C 10/27/2011, 2:06 PM

## 2011-10-27 NOTE — Progress Notes (Signed)
Call received from RN that patient had arrived to floor. This patient is unknown to the hospitalist service and has not been discussed with EDP. Per EDP notes/record accepting attending is Dr. Leticia Penna. Dr. Ouida Sills is her IM PCP, patient requesting he be notified of admission per RN. I gave a 1 time order for Reglan for nausea. I returned call to floor RN who will call Dr. Leticia Penna for additional admission orders.

## 2011-10-27 NOTE — Care Management Note (Unsigned)
    Page 1 of 1   10/27/2011     1:54:23 PM   CARE MANAGEMENT NOTE 10/27/2011  Patient:  Caitlin Alexander, Caitlin Alexander   Account Number:  [id number]  Date Initiated:  10/27/2011  Documentation initiated by:  Sharrie Rothman  Subjective/Objective Assessment:   Pt admitted from home with SBO. Pt lives alone and is independent with ADL's.     Action/Plan:   No CM/HH needs noted.   Anticipated DC Date:  10/29/2011   Anticipated DC Plan:  HOME/SELF CARE      DC Planning Services  CM consult      Choice offered to / List presented to:             Status of service:  In process, will continue to follow Medicare Important Message given?   (If response is "NO", the following Medicare IM given date fields will be blank) Date Medicare IM given:   Date Additional Medicare IM given:    Discharge Disposition:    Per UR Regulation:    If discussed at Long Length of Stay Meetings, dates discussed:    Comments:  10/27/11 1353 Arlyss Queen, RN BSN CM

## 2011-10-28 LAB — BASIC METABOLIC PANEL
BUN: 12 mg/dL (ref 6–23)
CO2: 30 mEq/L (ref 19–32)
Calcium: 9 mg/dL (ref 8.4–10.5)
Chloride: 96 mEq/L (ref 96–112)
Creatinine, Ser: 0.74 mg/dL (ref 0.50–1.10)
Glucose, Bld: 90 mg/dL (ref 70–99)

## 2011-10-28 LAB — CBC
HCT: 36.9 % (ref 36.0–46.0)
MCH: 31.2 pg (ref 26.0–34.0)
MCV: 91.3 fL (ref 78.0–100.0)
Platelets: 394 10*3/uL (ref 150–400)
RBC: 4.04 MIL/uL (ref 3.87–5.11)
WBC: 14.1 10*3/uL — ABNORMAL HIGH (ref 4.0–10.5)

## 2011-10-28 NOTE — Progress Notes (Addendum)
Subjective: No acute change. No flatus. No bowel movement. Nausea somewhat better but still persistent. No emesis. Abdominal pain is much improved. Denies any colicky abdominal pain today. No fevers or chills  Objective: Vital signs in last 24 hours: Temp:  [97.9 F (36.6 Alexander)-98.3 F (36.8 Alexander)] 97.9 F (36.6 Alexander) (08/02 0600) Pulse Rate:  [65-71] 71  (08/02 0600) Resp:  [20-21] 20  (08/02 0600) BP: (110-134)/(65-77) 134/77 mmHg (08/02 0600) SpO2:  [93 %-95 %] 95 % (08/01 2200) Last BM Date: 10/25/11  Intake/Output from previous day: 08/01 0701 - 08/02 0700 In: 1682 [I.V.:1682] Out: -  Intake/Output this shift:    General appearance: alert and no distress GI: Quiet, soft, mild distention, mild diffuse tenderness. No peritoneal signs. No hernias or masses.  Lab Results:   Basename 10/28/11 0516 10/27/11 0452  WBC 14.1* 18.0*  HGB 12.6 13.2  HCT 36.9 39.0  PLT 394 388   BMET  Basename 10/28/11 0516 10/27/11 0452  NA 133* 131*  K 4.7 4.6  CL 96 92*  CO2 30 28  GLUCOSE 90 125*  BUN 12 11  CREATININE 0.74 0.78  CALCIUM 9.0 9.6   PT/INR No results found for this basename: LABPROT:2,INR:2 in the last 72 hours ABG No results found for this basename: PHART:2,PCO2:2,PO2:2,HCO3:2 in the last 72 hours  Studies/Results: Ct Abdomen W Contrast  10/26/2011  *RADIOLOGY REPORT*  Clinical Data: Follow-up distal pancreatectomy and splenectomy.  CT ABDOMEN WITH CONTRAST  Technique:  Multidetector CT imaging of the abdomen was performed following the standard protocol during bolus administration of intravenous contrast.  Contrast: OMNIPAQUE IOHEXOL 300 MG/ML  SOLN  Comparison: 08/05/2009.  Findings: The lung bases are clear.  No pleural effusion or pulmonary nodule.  There is a stable hepatic cyst.  No worrisome hepatic lesions or intrahepatic biliary dilatation.  The gallbladder is normal.  No common bile duct dilatation.  Stable surgical changes from a partial distal  pancreatectomy.  No complicating features are demonstrated.  The spleen has also been surgically removed.  The adrenal glands and kidneys are unremarkable and stable.  The stomach as well distended.  No abnormalities are seen.  The third portion of the duodenum demonstrates moderate wall thickening and mild inflammatory change.  Findings suggest focal duodenitis.  The small bowel demonstrates moderate dilatation with normal/decompressed appearing distal small bowel loops.  Findings could be due to a mid distal small bowel obstruction.  The pelvis is not included.  The colon relatively decompressed.  The aorta is normal in caliber.  No dissection.  The bony structures unremarkable.  IMPRESSION:  1.  Stable surgical changes from a distal pancreatectomy and splenectomy. 2.  Wall thickening and inflammation of the third portion of the duodenum suggesting duodenitis. 3.  Dilated small bowel loops with normal/decompressed distal small bowel loops suggesting a small bowel obstruction.  Transition point is not identified but the pelvis is not included.  Would recommend clinical correlation and follow-up radiographs is indicated.  Original Report Authenticated By: P. Loralie Champagne, M.D.    Anti-infectives: Anti-infectives    None      Assessment/Plan: s/p * No surgery found * Small bowel obstruction. Continue conservative management at this time. Continue IV fluid hydration. Continue bowel rest. Again discussed with the patient placement of a nasogastric tube and we'll the decision up to the patient should she wish to proceed with placement of a nasogastric tube. Encouraged ambulation. Again discussed with patient surgical indications including both urgent and long-term indications. Her  questions and concerns were again discussed and addressed. Patient also expressed concerns regarding notification of Dr. Ouida Sills. I apologized to the patient as I had initially been informed by the emergency room that Dr. Ouida Sills was  the one who initiated the patient going to the emergency department. Apparently this was not expressed well enough to the patient yesterday and she was unaware of my understanding of the scenario. I explained to the patient that I was the impression that Dr. Ouida Sills was re: wear and apologize for not further contacting him. I reassured the patient that I have absolutely no problems with Dr. Ouida Sills also following the patient's course. I expressed as her primary care physician he has every right to continue to follow her course.  Again regarding her surgical issue I do feel at this time we will see how the weekend proceeds. Should she have limited progression to the weekend I do feel that she is likely to need surgical exploration early next week should her bowel structure not resolve. I did explain to the patient as we taken on the day by day basis and judged accordingly.  LOS: 2 days    Caitlin Alexander 10/28/2011  To note: Additionally patient's initial hyponatremia on presentation to the emergency department and likely due to her dehydration and small bowel obstruction does demonstrate improvement in response to IV fluid hydration.

## 2011-10-28 NOTE — Clinical Documentation Improvement (Signed)
Abnormal Labs Clarification  THIS DOCUMENT IS NOT A PERMANENT PART OF THE MEDICAL RECORD  TO RESPOND TO THE THIS QUERY, FOLLOW THE INSTRUCTIONS BELOW:  1. If needed, update documentation for the patient's encounter via the notes activity.  2. Access this query again and click edit on the [employer].  3. After updating, or not, click F2 to complete all highlighted (required) fields concerning your review. Select "additional documentation in the medical record" OR "no additional documentation provided".  4. Click Sign note button.  5. The deficiency will fall out of your InBasket *Please let us know if you are not able to complete this workflow by phone or e-mail (listed below).  Please update your documentation within the medical record to reflect your response to this query.                                                                                   10/28/11  Dear Dr. Leticia Penna Marton [place]  In a better effort to capture your patient's severity of illness, reflect appropriate length of stay and utilization of resources, a review of the medical record has revealed the following indicators. Based on your clinical judgment, please clarify and document in a progress note and/or discharge summary the clinical condition associated with the following supporting information.  In responding to this query please exercise your independent judgment.  The fact that a query is asked, does not imply that any particular answer is desired or expected.  Abnormal findings (laboratory, x-ray, pathologic, and other diagnostic results) are not coded and reported unless the physician indicates their clinical significance.   The medical record reflects the following clinical findings, please clarify the diagnostic and/or clinical significance:      Possible Clinical Conditions?                                 Supporting Information:  Hyponatremia                                    Lab Test:  Sodium  Hypernatremia               Risk Factors: Nausea/Vomiting/ Abdominal pain/NPO/NG tube Other Condition___________________                                                                                 Patient Values:  Cannot Clinically Determine_________  7/31=129                   8/1=131        8/2=133               Normal Values 135-145  Treatment: IV LR@110ml /h; Daily BMP    Reviewed: additional documentation in the medical record provided by Dr. Ouida Sills   Thank You,  Harless Litten  Clinical Documentation Specialist:  Pager  Health Information Management [place]

## 2011-10-29 LAB — CBC
HCT: 35.4 % — ABNORMAL LOW (ref 36.0–46.0)
MCH: 31.1 pg (ref 26.0–34.0)
MCHC: 33.6 g/dL (ref 30.0–36.0)
MCV: 92.4 fL (ref 78.0–100.0)
Platelets: 417 10*3/uL — ABNORMAL HIGH (ref 150–400)
RDW: 13.5 % (ref 11.5–15.5)
WBC: 13.3 10*3/uL — ABNORMAL HIGH (ref 4.0–10.5)

## 2011-10-29 LAB — BASIC METABOLIC PANEL
BUN: 12 mg/dL (ref 6–23)
Calcium: 9 mg/dL (ref 8.4–10.5)
Creatinine, Ser: 0.79 mg/dL (ref 0.50–1.10)
GFR calc Af Amer: >90 mL/min (ref >90)
GFR calc non Af Amer: 80 mL/min — ABNORMAL LOW (ref >90)

## 2011-10-29 NOTE — Progress Notes (Signed)
  Subjective: No nausea. Appetite is improving. Did have a sizable bowel movement yesterday. No complaints of abdominal pain today. No fevers or chills.  Objective: Vital signs in last 24 hours: Temp:  [98 F (36.7 C)-98.4 F (36.9 C)] 98 F (36.7 C) (08/03 0508) Pulse Rate:  [48-63] 63  (08/03 0508) Resp:  [20] 20  (08/03 0508) BP: (104-140)/(61-82) 104/61 mmHg (08/03 0508) SpO2:  [92 %-100 %] 95 % (08/03 0508) Last BM Date: 10/28/11  Intake/Output from previous day: 08/02 0701 - 08/03 0700 In: -  Out: 400 [Urine:400] Intake/Output this shift:    General appearance: alert and no distress GI: Positive bowel sounds, soft, no significant abdominal pain. No masses.  Lab Results:   [facility] 10/29/11 0605 10/28/11 0516  WBC 13.3* 14.1*  HGB 11.9* 12.6  HCT 35.4* 36.9  PLT 417* 394   BMET  Basename 10/29/11 0605 10/28/11 0516  NA 135 133*  K 4.7 4.7  CL 98 96  CO2 30 30  GLUCOSE 82 90  BUN 12 12  CREATININE 0.79 0.74  CALCIUM 9.0 9.0   PT/INR No results found for this basename: LABPROT:2,INR:2 in the last 72 hours ABG No results found for this basename: PHART:2,PCO2:2,PO2:2,HCO3:2 in the last 72 hours  Studies/Results: No results found.  Anti-infectives: Anti-infectives    None      Assessment/Plan: s/p * No surgery found * Small bowel obstruction. At this time it appears that the small bowel structure is resolving. I still feel this is likely a pelvic etiology. At this time we will start to advance the patient on her diet slowly as tolerated. If patient tolerates transferred to a regular diet this is Casimiro Needle for discharge. Continue to watch for continued bowel function. If patient tolerates clear liquids I will resume her home regiment later this afternoon if she tolerates. No acute surgical findings at this time.  LOS: 3 days    Dow Blahnik C 10/29/2011

## 2011-10-30 LAB — CBC
HCT: 35.1 % — ABNORMAL LOW (ref 36.0–46.0)
MCHC: 33.9 g/dL (ref 30.0–36.0)
Platelets: 470 10*3/uL — ABNORMAL HIGH (ref 150–400)
RDW: 13.3 % (ref 11.5–15.5)
WBC: 15.3 10*3/uL — ABNORMAL HIGH (ref 4.0–10.5)

## 2011-10-30 LAB — BASIC METABOLIC PANEL
BUN: 9 mg/dL (ref 6–23)
Chloride: 92 mEq/L — ABNORMAL LOW (ref 96–112)
GFR calc Af Amer: >90 mL/min (ref >90)
GFR calc non Af Amer: 87 mL/min — ABNORMAL LOW (ref >90)
Potassium: 3.7 mEq/L (ref 3.5–5.1)

## 2011-10-30 MED ORDER — CIPROFLOXACIN IN D5W 400 MG/200ML IV SOLN: 400.0000 mg | INTRAVENOUS | Status: DC

## 2011-10-30 MED ORDER — METOCLOPRAMIDE HCL 5 MG/ML IJ SOLN
10.0000 mg | Freq: Four times a day (QID) | INTRAMUSCULAR | Status: DC | PRN
  Administered 2011-10-30 (×2): 10 mg via INTRAVENOUS
  Filled 2011-10-30 (×2): qty 2

## 2011-10-30 MED ORDER — METRONIDAZOLE IN NACL 5-0.79 MG/ML-% IV SOLN: 500.0000 mg | Freq: Once | INTRAVENOUS | Status: DC

## 2011-10-30 NOTE — Progress Notes (Signed)
Patient had Jello and tea yesterday. Did not seem to tolerate as she had increased pain and nausea during the night. Unsuccessful attempt placing NG tube. Patient requested to rest before second attempt. Continues to have increased pain and nausea.

## 2011-10-30 NOTE — Progress Notes (Signed)
  Subjective: No significant change. Still with significant nausea. No emesis. No report of flatus in the last 24 hours.  Objective: Vital signs in last 24 hours: Temp:  [98.3 F (36.8 C)-98.8 F (37.1 C)] 98.8 F (37.1 C) (08/04 0541) Pulse Rate:  [60-68] 68  (08/04 0541) Resp:  [19-20] 20  (08/04 0541) BP: (129-151)/(73-77) 137/73 mmHg (08/04 0541) SpO2:  [90 %-94 %] 90 % (08/04 0541) Last BM Date: 10/28/11  Intake/Output from previous day: 08/03 0701 - 08/04 0700 In: 1460 [P.O.:240; I.V.:1220] Out: 400 [Urine:400] Intake/Output this shift:    General appearance: alert and no distress GI: Sparse but intermittent bowel sounds, soft, distended, mild diffuse tenderness. No peritoneal signs the  Lab Results:   [facility] 10/30/11 0553 10/29/11 0605  WBC 15.3* 13.3*  HGB 11.9* 11.9*  HCT 35.1* 35.4*  PLT 470* 417*   BMET  Basename 10/30/11 0553 10/29/11 0605  NA 131* 135  K 3.7 4.7  CL 92* 98  CO2 27 30  GLUCOSE 106* 82  BUN 9 12  CREATININE 0.62 0.79  CALCIUM 8.7 9.0   PT/INR No results found for this basename: LABPROT:2,INR:2 in the last 72 hours ABG No results found for this basename: PHART:2,PCO2:2,PO2:2,HCO3:2 in the last 72 hours  Studies/Results: No results found.  Anti-infectives: Anti-infectives    None      Assessment/Plan: s/p * No surgery found * Persistent small bowel obstruction. I did discuss with the patient continue conservative management however at this time I do feel surgical intervention is likely a unavoidable. She will tentatively be scheduled for the operating room tomorrow for exploratory laparotomy possible bowel resection and less significant change in her bowel function occurs. Risks benefits alternatives were discussed at length patient patient is agreeable to proceeding to the operating room should she continue on his current course  LOS: 4 days    Koltyn Kelsay C 10/30/2011

## 2011-10-30 NOTE — Progress Notes (Signed)
Pt had small bowel movement this afternoon. No flatus at this time. Will continue to monitor.

## 2011-10-31 ENCOUNTER — Encounter (HOSPITAL_COMMUNITY): Payer: Self-pay | Admitting: *Deleted

## 2011-10-31 ENCOUNTER — Encounter (HOSPITAL_COMMUNITY): Payer: Self-pay | Admitting: Anesthesiology

## 2011-10-31 ENCOUNTER — Inpatient Hospital Stay (HOSPITAL_COMMUNITY): Payer: Medicare Other | Admitting: Anesthesiology

## 2011-10-31 ENCOUNTER — Encounter (HOSPITAL_COMMUNITY)
Admission: EM | Disposition: A | Discharge status: Other Acute Inpatient Hospital | Payer: Self-pay | Source: Home / Self Care | Attending: General Surgery

## 2011-10-31 HISTORY — PX: BOWEL RESECTION: SHX1257

## 2011-10-31 HISTORY — PX: LAPAROTOMY: SHX154

## 2011-10-31 LAB — BASIC METABOLIC PANEL
Chloride: 93 mEq/L — ABNORMAL LOW (ref 96–112)
GFR calc Af Amer: >90 mL/min (ref >90)
GFR calc non Af Amer: 85 mL/min — ABNORMAL LOW (ref >90)
Glucose, Bld: 98 mg/dL (ref 70–99)
Potassium: 3.6 mEq/L (ref 3.5–5.1)
Sodium: 132 mEq/L — ABNORMAL LOW (ref 135–145)

## 2011-10-31 LAB — CBC
HCT: 36.6 % (ref 36.0–46.0)
Hemoglobin: 12.5 g/dL (ref 12.0–15.0)
MCHC: 34.2 g/dL (ref 30.0–36.0)
RDW: 13.3 % (ref 11.5–15.5)
WBC: 14.5 10*3/uL — ABNORMAL HIGH (ref 4.0–10.5)

## 2011-10-31 SURGERY — LAPAROTOMY, EXPLORATORY
Anesthesia: General | Site: Abdomen | Wound class: Contaminated

## 2011-10-31 MED ORDER — FENTANYL CITRATE 0.05 MG/ML IJ SOLN
INTRAMUSCULAR | Status: AC
  Filled 2011-10-31: qty 2

## 2011-10-31 MED ORDER — SODIUM CHLORIDE 0.9 % IJ SOLN
9.0000 mL | INTRAMUSCULAR | Status: DC | PRN
  Administered 2011-11-01: 9 mL via INTRAVENOUS

## 2011-10-31 MED ORDER — FENTANYL CITRATE 0.05 MG/ML IJ SOLN
INTRAMUSCULAR | Status: DC | PRN
  Administered 2011-10-31 (×7): 50 ug via INTRAVENOUS

## 2011-10-31 MED ORDER — SODIUM CHLORIDE 0.9 % IR SOLN
Status: DC | PRN
  Administered 2011-10-31: 1000 mL
  Administered 2011-10-31: 2000 mL

## 2011-10-31 MED ORDER — GLYCOPYRROLATE 0.2 MG/ML IJ SOLN
INTRAMUSCULAR | Status: AC
  Filled 2011-10-31: qty 3

## 2011-10-31 MED ORDER — HYDROMORPHONE 0.3 MG/ML IV SOLN
INTRAVENOUS | Status: DC
  Administered 2011-10-31: 0.6 mg via INTRAVENOUS
  Administered 2011-10-31: 0.5 mg via INTRAVENOUS
  Administered 2011-10-31 (×2): via INTRAVENOUS
  Filled 2011-10-31 (×2): qty 25

## 2011-10-31 MED ORDER — MIDAZOLAM HCL 2 MG/2ML IJ SOLN
1.0000 mg | INTRAMUSCULAR | Status: DC | PRN
  Administered 2011-10-31: 2 mg via INTRAVENOUS

## 2011-10-31 MED ORDER — MIDAZOLAM HCL 2 MG/2ML IJ SOLN
INTRAMUSCULAR | Status: AC
  Filled 2011-10-31: qty 2

## 2011-10-31 MED ORDER — EPHEDRINE SULFATE 50 MG/ML IJ SOLN
INTRAMUSCULAR | Status: AC
  Filled 2011-10-31: qty 1

## 2011-10-31 MED ORDER — PROPOFOL 10 MG/ML IV EMUL
INTRAVENOUS | Status: AC
  Filled 2011-10-31: qty 20

## 2011-10-31 MED ORDER — SUCCINYLCHOLINE CHLORIDE 20 MG/ML IJ SOLN
INTRAMUSCULAR | Status: DC | PRN
  Administered 2011-10-31: 120 mg via INTRAVENOUS

## 2011-10-31 MED ORDER — METRONIDAZOLE IN NACL 5-0.79 MG/ML-% IV SOLN
INTRAVENOUS | Status: DC | PRN
  Administered 2011-10-31: 500 mg via INTRAVENOUS

## 2011-10-31 MED ORDER — ONDANSETRON HCL 4 MG/2ML IJ SOLN: 4.0000 mg | Freq: Once | INTRAMUSCULAR | Status: DC | PRN

## 2011-10-31 MED ORDER — METRONIDAZOLE IN NACL 5-0.79 MG/ML-% IV SOLN: 500.0000 mg | Freq: Once | INTRAVENOUS | Status: DC

## 2011-10-31 MED ORDER — EPHEDRINE SULFATE 50 MG/ML IJ SOLN
INTRAMUSCULAR | Status: DC | PRN
  Administered 2011-10-31 (×2): 10 mg via INTRAVENOUS

## 2011-10-31 MED ORDER — SUCCINYLCHOLINE CHLORIDE 20 MG/ML IJ SOLN
INTRAMUSCULAR | Status: AC
  Filled 2011-10-31: qty 1

## 2011-10-31 MED ORDER — LIDOCAINE HCL (CARDIAC) 10 MG/ML IV SOLN
INTRAVENOUS | Status: DC | PRN
  Administered 2011-10-31: 50 mg via INTRAVENOUS

## 2011-10-31 MED ORDER — PROPOFOL 10 MG/ML IV EMUL
INTRAVENOUS | Status: DC | PRN
  Administered 2011-10-31: 150 mg via INTRAVENOUS

## 2011-10-31 MED ORDER — NALOXONE HCL 0.4 MG/ML IJ SOLN: 0.4000 mg | INTRAMUSCULAR | Status: DC | PRN

## 2011-10-31 MED ORDER — DIPHENHYDRAMINE HCL 12.5 MG/5ML PO ELIX: 12.5000 mg | ORAL_SOLUTION | Freq: Four times a day (QID) | ORAL | Status: DC | PRN

## 2011-10-31 MED ORDER — GLYCOPYRROLATE 0.2 MG/ML IJ SOLN
0.2000 mg | Freq: Once | INTRAMUSCULAR | Status: AC
  Administered 2011-10-31: 0.2 mg via INTRAVENOUS

## 2011-10-31 MED ORDER — GLYCOPYRROLATE 0.2 MG/ML IJ SOLN
INTRAMUSCULAR | Status: AC
  Filled 2011-10-31: qty 1

## 2011-10-31 MED ORDER — SODIUM CHLORIDE 0.9 % IJ SOLN
INTRAMUSCULAR | Status: AC
  Administered 2011-10-31: 10 mL
  Filled 2011-10-31: qty 3

## 2011-10-31 MED ORDER — CIPROFLOXACIN IN D5W 400 MG/200ML IV SOLN
400.0000 mg | INTRAVENOUS | Status: AC
  Administered 2011-10-31: 400 mg via INTRAVENOUS

## 2011-10-31 MED ORDER — ROCURONIUM BROMIDE 50 MG/5ML IV SOLN
INTRAVENOUS | Status: AC
  Filled 2011-10-31: qty 1

## 2011-10-31 MED ORDER — GLYCOPYRROLATE 0.2 MG/ML IJ SOLN
INTRAMUSCULAR | Status: DC | PRN
  Administered 2011-10-31: 0.6 mg via INTRAVENOUS

## 2011-10-31 MED ORDER — ONDANSETRON HCL 4 MG/2ML IJ SOLN
4.0000 mg | Freq: Once | INTRAMUSCULAR | Status: AC
  Administered 2011-10-31: 4 mg via INTRAVENOUS

## 2011-10-31 MED ORDER — FENTANYL CITRATE 0.05 MG/ML IJ SOLN
25.0000 ug | INTRAMUSCULAR | Status: DC | PRN
  Administered 2011-10-31 (×2): 50 ug via INTRAVENOUS
  Administered 2011-10-31 (×4): 25 ug via INTRAVENOUS

## 2011-10-31 MED ORDER — ONDANSETRON HCL 4 MG/2ML IJ SOLN
INTRAMUSCULAR | Status: AC
  Filled 2011-10-31: qty 2

## 2011-10-31 MED ORDER — LACTATED RINGERS IV SOLN
INTRAVENOUS | Status: DC | PRN
  Administered 2011-10-31 (×2): via INTRAVENOUS

## 2011-10-31 MED ORDER — METRONIDAZOLE IN NACL 5-0.79 MG/ML-% IV SOLN
INTRAVENOUS | Status: AC
  Filled 2011-10-31: qty 100

## 2011-10-31 MED ORDER — FENTANYL CITRATE 0.05 MG/ML IJ SOLN
INTRAMUSCULAR | Status: AC
  Filled 2011-10-31: qty 5

## 2011-10-31 MED ORDER — NEOSTIGMINE METHYLSULFATE 1 MG/ML IJ SOLN
INTRAMUSCULAR | Status: AC
  Filled 2011-10-31: qty 10

## 2011-10-31 MED ORDER — ROCURONIUM BROMIDE 100 MG/10ML IV SOLN
INTRAVENOUS | Status: DC | PRN
  Administered 2011-10-31: 20 mg via INTRAVENOUS
  Administered 2011-10-31: 10 mg via INTRAVENOUS

## 2011-10-31 MED ORDER — NEOSTIGMINE METHYLSULFATE 1 MG/ML IJ SOLN
INTRAMUSCULAR | Status: DC | PRN
  Administered 2011-10-31: 4 mg via INTRAVENOUS

## 2011-10-31 MED ORDER — CIPROFLOXACIN IN D5W 400 MG/200ML IV SOLN
INTRAVENOUS | Status: AC
  Filled 2011-10-31: qty 200

## 2011-10-31 MED ORDER — LIDOCAINE HCL (PF) 1 % IJ SOLN
INTRAMUSCULAR | Status: AC
  Filled 2011-10-31: qty 5

## 2011-10-31 MED ORDER — LACTATED RINGERS IV SOLN: INTRAVENOUS | Status: DC

## 2011-10-31 MED ORDER — DIPHENHYDRAMINE HCL 50 MG/ML IJ SOLN: 12.5000 mg | Freq: Four times a day (QID) | INTRAMUSCULAR | Status: DC | PRN

## 2011-10-31 MED ORDER — ONDANSETRON HCL 4 MG/2ML IJ SOLN: 4.0000 mg | Freq: Four times a day (QID) | INTRAMUSCULAR | Status: DC | PRN

## 2011-10-31 SURGICAL SUPPLY — 44 items
BAG HAMPER (MISCELLANEOUS) ×3 IMPLANT
CLOTH BEACON ORANGE TIMEOUT ST (SAFETY) ×3 IMPLANT
COVER LIGHT HANDLE STERIS (MISCELLANEOUS) ×6 IMPLANT
DRAPE WARM FLUID 44X44 (DRAPE) ×3 IMPLANT
DURAPREP 26ML APPLICATOR (WOUND CARE) ×3 IMPLANT
ELECT REM PT RETURN 9FT ADLT (ELECTROSURGICAL) ×3
ELECTRODE REM PT RTRN 9FT ADLT (ELECTROSURGICAL) ×2 IMPLANT
GLOVE BIO SURGEON STRL SZ7.5 (GLOVE) ×1 IMPLANT
GLOVE BIOGEL PI IND STRL 7.0 (GLOVE) IMPLANT
GLOVE BIOGEL PI IND STRL 7.5 (GLOVE) ×2 IMPLANT
GLOVE BIOGEL PI INDICATOR 7.0 (GLOVE) ×2
GLOVE BIOGEL PI INDICATOR 7.5 (GLOVE) ×2
GLOVE ECLIPSE 6.5 STRL STRAW (GLOVE) ×1 IMPLANT
GLOVE ECLIPSE 7.0 STRL STRAW (GLOVE) ×4 IMPLANT
GLOVE SS BIOGEL STRL SZ 6.5 (GLOVE) IMPLANT
GLOVE SUPERSENSE BIOGEL SZ 6.5 (GLOVE) ×1
GOWN STRL REIN XL XLG (GOWN DISPOSABLE) ×9 IMPLANT
INST SET MAJOR GENERAL (KITS) ×3 IMPLANT
KIT ROOM TURNOVER APOR (KITS) ×3 IMPLANT
MANIFOLD NEPTUNE II (INSTRUMENTS) ×3 IMPLANT
NS IRRIG 1000ML POUR BTL (IV SOLUTION) ×5 IMPLANT
PACK ABDOMINAL MAJOR (CUSTOM PROCEDURE TRAY) ×3 IMPLANT
PAD ARMBOARD 7.5X6 YLW CONV (MISCELLANEOUS) ×3 IMPLANT
RELOAD LINEAR CUT PROX 55 BLUE (ENDOMECHANICALS) ×12 IMPLANT
RELOAD STAPLE 55 3.8 BLU REG (ENDOMECHANICALS) IMPLANT
RETRACTOR WND ALEXIS 25 LRG (MISCELLANEOUS) IMPLANT
RTRCTR WOUND ALEXIS 25CM LRG (MISCELLANEOUS) ×3
SEALER TISSUE G2 CVD JAW 35 (ENDOMECHANICALS) IMPLANT
SEALER TISSUE G2 CVD JAW 45CM (ENDOMECHANICALS) ×1
SET BASIN LINEN APH (SET/KITS/TRAYS/PACK) ×3 IMPLANT
SPONGE GAUZE 4X4 12PLY (GAUZE/BANDAGES/DRESSINGS) ×3 IMPLANT
SPONGE LAP 18X18 X RAY DECT (DISPOSABLE) ×1 IMPLANT
STAPLER PROXIMATE 55 BLUE (STAPLE) ×1 IMPLANT
STAPLER VISISTAT 35W (STAPLE) ×3 IMPLANT
SUCTION POOLE TIP (SUCTIONS) ×4 IMPLANT
SUT CHROMIC 0 SH (SUTURE) IMPLANT
SUT CHROMIC 2 0 SH (SUTURE) IMPLANT
SUT NOVA NAB GS-26 0 60 (SUTURE) ×6 IMPLANT
SUT PDS AB 0 CTX 60 (SUTURE) ×2 IMPLANT
SUT SILK 2 0 (SUTURE)
SUT SILK 2-0 18XBRD TIE 12 (SUTURE) ×2 IMPLANT
SUT SILK 3 0 SH CR/8 (SUTURE) ×4 IMPLANT
TAPE CLOTH SURG 4X10 WHT LF (GAUZE/BANDAGES/DRESSINGS) ×1 IMPLANT
TRAY FOLEY CATH 14FR (SET/KITS/TRAYS/PACK) ×3 IMPLANT

## 2011-10-31 NOTE — Progress Notes (Signed)
  Subjective: No acute change. New bowel function. No flatus. Still some nausea.  Objective: Vital signs in last 24 hours: Temp:  [98.1 F (36.7 C)-98.2 F (36.8 C)] 98.1 F (36.7 C) (08/05 0524) Pulse Rate:  [62-71] 65  (08/05 0524) Resp:  [18-20] 20  (08/05 0524) BP: (115-150)/(67-81) 127/74 mmHg (08/05 0524) SpO2:  [91 %-95 %] 95 % (08/05 0524) Last BM Date: 10/30/11  Intake/Output from previous day: 08/04 0701 - 08/05 0700 In: 1562 [P.O.:240; I.V.:1320; IV Piggyback:2] Out: 1300 [Urine:1300] Intake/Output this shift:    General appearance: alert, cooperative and no distress GI: Quiet, soft, mild to moderate distention. Mild diffuse tenderness. No peritoneal signs.  Lab Results:   Basename 10/31/11 0520 10/30/11 0553  WBC 14.5* 15.3*  HGB 12.5 11.9*  HCT 36.6 35.1*  PLT 502* 470*   BMET  Basename 10/31/11 0520 10/30/11 0553  NA 132* 131*  K 3.6 3.7  CL 93* 92*  CO2 23 27  GLUCOSE 98 106*  BUN 10 9  CREATININE 0.66 0.62  CALCIUM 8.7 8.7   PT/INR No results found for this basename: LABPROT:2,INR:2 in the last 72 hours ABG No results found for this basename: PHART:2,PCO2:2,PO2:2,HCO3:2 in the last 72 hours  Studies/Results: No results found.  Anti-infectives: Anti-infectives     Start     Dose/Rate Route Frequency Ordered Stop   10/31/11 0000   metroNIDAZOLE (FLAGYL) IVPB 500 mg  Status:  Discontinued        500 mg 100 mL/hr over 60 Minutes Intravenous  Once 10/30/11 1419 10/30/11 1423   10/30/11 1419   ciprofloxacin (CIPRO) IVPB 400 mg  Status:  Discontinued        400 mg 200 mL/hr over 60 Minutes Intravenous 120 min pre-op 10/30/11 1419 10/30/11 1422          Assessment/Plan: s/p Procedure(s) (LRB): EXPLORATORY LAPAROTOMY (N/A) Persistent small bowel obstruction. Surgical options again discussed with patient. Will plan to proceed with an exploratory laparotomy possible bowel resection possible ostomy today in the operating room. Her  questions and concerns are addressed patient will be consented for the planned procedure.  LOS: 5 days    Elveria Lauderbaugh C 10/31/2011

## 2011-10-31 NOTE — Preoperative (Signed)
Beta Blockers   Reason not to administer Beta Blockers:Not Applicable 

## 2011-10-31 NOTE — Progress Notes (Signed)
Pt c/o pain 10/10 at surgical site after pain medication given in recovery room and dilaudid given once in pt's room. Dr. Leticia Penna notified. New orders for PCA pump

## 2011-10-31 NOTE — Addendum Note (Signed)
Addendum  created 10/31/11 1308 by Jaymen Fetch, MD   Modules edited:Anesthesia Attestations    

## 2011-10-31 NOTE — Anesthesia Postprocedure Evaluation (Signed)
  Anesthesia Post-op Note  Patient: Caitlin Alexander  Procedure(s) Performed: Procedure(s) (LRB): EXPLORATORY LAPAROTOMY (N/A) SMALL BOWEL RESECTION ()  Patient Location: PACU  Anesthesia Type: General  Level of Consciousness: awake, alert , oriented and patient cooperative  Airway and Oxygen Therapy: Patient Spontanous Breathing and Patient connected to face mask oxygen  Post-op Pain: mild  Post-op Assessment: Post-op Vital signs reviewed, Patient's Cardiovascular Status Stable, Respiratory Function Stable, Patent Airway and No signs of Nausea or vomiting  Post-op Vital Signs: Reviewed and stable  Complications: No apparent anesthesia complications

## 2011-10-31 NOTE — Op Note (Signed)
Patient:  Caitlin Alexander  DOB:  09-28-1935  MRN:  [id number]   Preop Diagnosis:  Small bowel obstruction  Postop Diagnosis:  The same  Procedure:  Exploratory laparotomy, small bowel resection with primary side-to-side anastomoses.  Surgeon:  Dr. Tilford Pillar  Anes:  General endotracheal  Indications:  Patient is a 76 year old female presented to [facility] with nausea vomiting and diffuse abdominal pain. Workup and evaluation was consistent for a small bowel obstruction. Conservative management was initially entertained however as patient continued to fail conservative management with persistent nausea surgical options were again discussed. Risks benefits alternatives of exploratory laparotomy possible bowel resection possible ostomy were discussed at length the patient including but not limited to risk of bleeding, infection, anastomotic leak, intraoperative cardiac and pulmonary events. Her questions and concerns were addressed the patient was consented for the planned procedure.  Procedure note:  Patient is taken to the operating room placed in supine position on the operating table time the general anesthetic is a Optician, dispensing. Once patient was asleep symmetrically intubated by the nurse anesthetist. At this point a Foley catheter is placed in standard sterile fashion by the operative staff. A nasogastric tube is also position by the nurse anesthetist at this time with a large amount of foul-smelling thick gastric secretions. Her abdomen is prepped with DuraPrep solution and draped in standard fashion. A infraumbilical midline incision was created with a slight heel defect around the left lateral aspect of the umbilicus. His initial incision was created with a 10 blade scalpel with additional dissection down to subcuticular tissue carried out using electrocautery. The anterior fascia is scored. Is grasped Coker clamps and lifted anteriorly. The peritoneum was grasped with a hemostat and  entered into sharply with Metzenbaum scissors. A large amount of ascitic fluid was encountered. This is clear with no odor. There is no evidence of any purulence. I was able to free some omental adhesions off the anterior abdominal wall allowing placement of a large protractor retractor. At this point I began inspecting the bowel and following this proximally. During this mobilization the bowel was inspected in a gentle fashion. I did note however there was a small perforation within the small intestine. This did not appear to be associated with any tear the serosa 42 obvious effusion. Palpation there is not demonstrate any evidence of a tumor mass. This did have a sharp angulation in this area suspicious for the point of obstruction as the bowel distal to this was decompressed and proximal illicit noted to be more distended. I quickly controlled drainage with 2 Kelley clamps. I inspected the bowel both proximally and distally. There is no other C. the adhesions other than at the terminal ileum. The terminal ileum did appear patent but these adhesions were freed. The cecum was noted to be a normal caliber. The colon was inspected and palpated there were no other masses or abnormalities noted. At this time I turned my attention back to the perforation and small bowel resection.  A window was created between the small bowel and the mesentery both proximal and distal to the perforation. This was created with electrocautery and hemostat. The small bowel was divided and both proximal and distally using 2 separate reload so a GIA 55 standard stapler. The mesentery of the specimen was divided using the Enseal ocular device. There was one vessel that was not controlled with the Enseal device and I opted to place a 2-0 silk suture ligature to obtain hemostasis. At this point the  small bowel was free it was placed in the back table sent as a permanent specimen to pathology. Again palpation this area did not initiate any  nodularity, lymphadenopathy, or suspicious masses. At this time I did termite attention to finishing the anastomosis. A 2-0 silk was utilized to pexed the small bowel into the side-to-side fashion. 8 enterotomy was created and both ends of the small intestine with electrocautery. I did milk the small bowel contents back to this area and used a. Should device to aspirate the luminal contents. With the small bowel decompressed on both sides a and a GIA 55 stapler reload was again utilized to create the stapled anastomosis in a side-to-side fashion. The remaining enterotomy was closed with 2-0 silk in simple or fashion. The apex of the anastomosis was also reinforced with a 2-0 silk. The small bowel mesentery defect was reapproximated using 2-0 silk.   At this time I did run the small intestine again from the ligament of Treitz to the terminal ileum. I did not identify any other abnormalities or area suspicious. At this time palpation of the small intestine anastomosis demonstrated a widely patent anastomosis. The liver was palpated this appeared normal on palpation with no nodularity. I was unable to palpate the left upper quadrant due to the presence of adhesions from the patient's prior distal pancreatectomy and splenectomy in the stomach was not easily palpated either. The colon was normal with no palpable abnormalities down to the rectum. The pelvis was noted to be normal. At this time I did copiously irrigate the surgical field with warm saline. The returning aspirate was clear. The small intestine was returned back into the abdominal cavity. The remnants of the omentum were pulled back over the small bowel and at this time I turned my attention to closure.  Using a oh looped PDS x2 the fascia was reapproximated starting the initial suture inferiorly to bring this to the midportion incision starting the second suture superiorly. The sutures were secured to the catheter at the midportion. At this point the  subcuticular tissue was irrigated. The skin edges reapproximated using skin staples. The skin was washed dried moist dry towel. Sterile 4 x 4 dressings were placed, the drapes removed, and the dressings were secured. At this point the patient was allowed to come out of general anesthetic. She stretched the postanesthetic area in stable condition. At the conclusion of procedure all instrument, sponge, needle counts are correct. Patient tolerated procedure she well.  Complications:  None apparent  EBL:  Less than 150 ML's  Specimen:  Small bowel

## 2011-10-31 NOTE — Anesthesia Procedure Notes (Signed)
Procedure Name: Intubation Date/Time: 10/31/2011 11:19 AM Performed by: Carolyne Littles, Dayanis Bergquist L Pre-anesthesia Checklist: Patient identified, Patient being monitored, Timeout performed, Emergency Drugs available and Suction available Patient Re-evaluated:Patient Re-evaluated prior to inductionOxygen Delivery Method: Circle System Utilized Preoxygenation: Pre-oxygenation with 100% oxygen Intubation Type: IV induction, Rapid sequence and Cricoid Pressure applied Ventilation: Mask ventilation without difficulty Grade View: Grade I Tube type: Oral Tube size: 7.0 mm Number of attempts: 1 Airway Equipment and Method: stylet and Video-laryngoscopy Placement Confirmation: ETT inserted through vocal cords under direct vision,  positive ETCO2 and breath sounds checked- equal and bilateral Secured at: 21 cm Tube secured with: Tape Dental Injury: Teeth and Oropharynx as per pre-operative assessment  Difficulty Due To: Difficulty was anticipated

## 2011-10-31 NOTE — Progress Notes (Signed)
NAMEWELMA, MCCOMBS                ACCOUNT NO.:  [id number]  MEDICAL RECORD NO.:  [id number]  LOCATION:                                 FACILITY:  PHYSICIAN:  Kingsley Callander. Ouida Sills, MD       DATE OF BIRTH:  Aug 16, 1935  DATE OF PROCEDURE:  10/30/2011 DATE OF DISCHARGE:                                PROGRESS NOTE   SUBJECTIVE:  Ms. Demos tried clear liquids yesterday, but developed significant abdominal pain, nausea, and heaving after trying Jell-O and tea.  She has not had another bowel movement.  She denies passing any gas.  She has been treated with Dilaudid.  White count remains mildly elevated.  OBJECTIVE:  VITAL SIGNS:  She is afebrile.  Vital signs are normal. LUNGS:  Clear. HEART:  Regular with no murmurs. ABDOMEN:  Mildly tender diffusely and mildly distended.  Bowel sounds are present.  IMPRESSION/PLAN:  Small bowel obstruction.  She has unfortunately failed a trial of clear liquids.  She is comfortable appearing at present, but I am afraid she is looking like she will require a laparotomy with possible lysis of adhesions.  She will be seeing my Surgery, who will continue IV fluids with p.r.n. antiemetics and analgesics.     Kingsley Callander. Ouida Sills, MD     ROF/MEDQ  D:  10/30/2011  T:  10/31/2011  Job:  [id number]

## 2011-10-31 NOTE — Progress Notes (Signed)
Caitlin Alexander, Caitlin Alexander                ACCOUNT NO.:  [id number]  MEDICAL RECORD NO.:  [id number]  LOCATION:                                 FACILITY:  PHYSICIAN:  Kingsley Callander. Ouida Sills, MD       DATE OF BIRTH:  Jul 30, 1935  DATE OF PROCEDURE:  10/31/2011 DATE OF DISCHARGE:                                PROGRESS NOTE   SUBJECTIVE:  Caitlin Alexander continues to feel abdominal discomfort with belching and nausea.  She passed 2 use very small pieces of stool last night she states.  OBJECTIVE:  VITAL SIGNS:  She has had no fever temperature is 98.1 with a pulse of 65 and blood pressure 127/74. LUNGS:  Clear. HEART:  Regular with no murmurs. ABDOMEN.  Still somewhat distended and is mildly diffusely tender.  IMPRESSION/PLAN:  Small bowel obstruction.  Symptoms persist despite conservative management.  White count today is 14.5 with a hemoglobin of 12.5 and platelet count 502,000.  Sodium is 132 and potassium is 3.6. Renal function is normal.  PLAN:  To proceed with exploratory laparotomy today.  She has clearly failed conservative management.  There are no medical contraindications to surgery at this point.     Kingsley Callander. Ouida Sills, MD     ROF/MEDQ  D:  10/31/2011  T:  10/31/2011  Job:  [id number]

## 2011-10-31 NOTE — Progress Notes (Signed)
Patient had another nurse pull up MED rec to see what type of pain medication was given last, and then proceeded to tell the nurse that the other staff was trying to get over on her by giving her a different pain medication.  Went in to talk to the patient about her pain medication and explain that no one was trying to get over on her that it wasn't time for the medication that she preferred and that she was given the alternate medication to give her relief until the other medication was due and  that this was explained to the patient prior to administration of the medication.  Patient stated that she must have been too sick to understand when this was being explained.  Patient was given her preferred pain medication and is currently resting comfortably in her room.

## 2011-10-31 NOTE — Transfer of Care (Signed)
Immediate Anesthesia Transfer of Care Note  Patient: Caitlin Alexander  Procedure(s) Performed: Procedure(s) (LRB): EXPLORATORY LAPAROTOMY (N/A) SMALL BOWEL RESECTION ()  Patient Location: PACU  Anesthesia Type: General  Level of Consciousness: awake, alert , oriented and patient cooperative  Airway & Oxygen Therapy: Patient Spontanous Breathing and Patient connected to face mask oxygen  Post-op Assessment: Report given to PACU RN and Post -op Vital signs reviewed and stable  Post vital signs: Reviewed and stable  Complications: No apparent anesthesia complications

## 2011-10-31 NOTE — Progress Notes (Signed)
NAMEFRANCIE, KEELING                ACCOUNT NO.:  [id number]  MEDICAL RECORD NO.:  [id number]  LOCATION:                                 FACILITY:  PHYSICIAN:  Kingsley Callander. Ouida Sills, MD       DATE OF BIRTH:  1936-03-20  DATE OF PROCEDURE:  10/29/2011 DATE OF DISCHARGE:                                PROGRESS NOTE   SUBJECTIVE:  Ms. Waguespack had some heaving earlier this morning.  She had a bowel movement yesterday.  She denies any pain or nausea now.  She has been up and walking about.  OBJECTIVE:  GENERAL:  She has had no fever. VITAL SIGNS:  Her temperature is 98 with a pulse of 63 and blood pressure of 104/61.  She is in no distress. LUNGS:  Clear. HEART:  Regular with no murmurs. ABDOMEN:  Minimally distended and minimally tender with no hepatosplenomegaly.  Bowel sounds are present.  IMPRESSION/PLAN:  Small bowel obstruction.  It is reassuring that she has been able to have a stool.  She is comfortable now.  She is not really passing any gas.  We will leave the decision to Dr. Leticia Penna regarding consideration of advancing her diet.  Continue IV fluids.  Her white count is down to 13.3 with a hemoglobin of 11.9.  Her renal function and electrolytes are normal.     Kingsley Callander. Ouida Sills, MD     ROF/MEDQ  D:  10/29/2011  T:  10/29/2011  Job:  [id number]

## 2011-10-31 NOTE — Addendum Note (Signed)
Addendum  created 10/31/11 1308 by Roselie Awkward, MD   Modules edited:Anesthesia Attestations

## 2011-10-31 NOTE — Consult Note (Signed)
Caitlin Alexander, Caitlin Alexander                ACCOUNT NO.:  [id number]  MEDICAL RECORD NO.:  [id number]  LOCATION:                                 FACILITY:  PHYSICIAN:  Kingsley Callander. Ouida Sills, MD       DATE OF BIRTH:  1935-05-11  DATE OF CONSULTATION:  10/28/2011 DATE OF DISCHARGE:                                CONSULTATION   CHIEF COMPLAINT:  Abdominal pain.  HISTORY OF PRESENT ILLNESS:  This patient is a 76 year old white female, who was admitted by Dr. Leticia Penna after she presented to the emergency room with abdominal pain and vomiting.  She was hospitalized and treated for a small bowel obstruction.  The patient incidentally had undergone a CT scan of the abdomen on the day of admission.  The CT scan had been performed for followup evaluation of a pancreatic mass.  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  This CT was for followup of that. She unfortunately had no evidence of recurrent mass, but did have evidence of dilated loops of small bowel consistent with small bowel obstruction.  She was hospitalized and has been on bowel rest, IV fluids, and p.r.n. analgesics.  She has not required nasogastric suction.  She has not vomited since admission.  Her symptoms had begun on Thursday prior to admission, when she began feeling upper abdominal discomfort.  PAST MEDICAL HISTORY: 1. 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 2. Hyperlipidemia. 3. Anxiety. 4. Constipation. 5. Hysterectomy. 6. Appendectomy. 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 10.Osteoporosis.  MEDICATIONS: 1. Lovastatin 20 mg daily. 2. Ativan 2 mg t.i.d. 3. MiraLAX 17 g daily.  ALLERGIES:  MACRODANTIN, PROMETHAZINE, SULFA, and SUPRAX.  FAMILY HISTORY:  Her father had a stroke.  Her mother had dementia.  A brother had diabetes  and an MI.  Another brother has had hypertension.  SOCIAL HISTORY:  She does not smoke, drink, or use recreational drugs. She lives at home alone.  Her husband is deceased.  REVIEW OF SYSTEMS:  Noncontributory.  PHYSICAL EXAMINATION:  VITAL SIGNS:  Temperature 98.4, pulse 63, respirations 20, blood pressure 140/82. GENERAL:  Alert and in no distress. HEENT:  No scleral icterus.  Nose and oropharynx are unremarkable. NECK:  Supple with no JVD, thyromegaly, or carotid bruit. LUNGS:  Clear. HEART:  Regular with no murmurs. ABDOMEN:  Minimally tender and minimally distended.  Bowel sounds are active.  No mass or hepatosplenomegaly. GU:  No CVA tenderness. EXTREMITIES:  No cyanosis, clubbing, or edema. NEURO:  Grossly normal. LYMPH NODES:  No cervical or supraclavicular enlargement.  LABORATORY DATA:  White count 14.1, down from 18.0, hemoglobin 12.6, platelets 394,000.  Sodium 133, potassium 4.7, bicarb 30, BUN 12, creatinine 0.74, calcium 9.0, glucose 90.  IMPRESSION/PLAN: 1. Small bowel obstruction.  She appears to be improving with     conservative management at this point.  We will hopefully be able     to avoid a laparotomy.  If she has recurrent vomiting, a  nasogastric tube will be placed.  She is receiving IV pantoprazole.     She has an order for morphine as needed and Zofran as needed.  She     is receiving DVT prophylaxis with enoxaparin.  Her case has been     discussed with Dr. Leticia Penna. 2. Status post distal pancreatectomy and splenectomy. 3. History of anxiety. 4. Hyperlipidemia. 5. History of constipation.  Treated with MiraLAX.     Kingsley Callander. Ouida Sills, MD     ROF/MEDQ  D:  10/29/2011  T:  10/29/2011  Job:  [id number]

## 2011-10-31 NOTE — Anesthesia Preprocedure Evaluation (Signed)
Anesthesia Evaluation  Patient identified by MRN, date of birth, ID band Patient awake    Reviewed: Allergy & Precautions, H&P , NPO status , Patient's Chart, lab work & pertinent test results  Airway Mallampati: III TM Distance: >3 FB Neck ROM: Full  Mouth opening: Limited Mouth Opening  Dental  (+) Caps   Pulmonary neg pulmonary ROS,    Pulmonary exam normal       Cardiovascular negative cardio ROS  Rhythm:Regular Rate:Normal     Neuro/Psych negative neurological ROS  negative psych ROS   GI/Hepatic negative GI ROS, Neg liver ROS, GERD-  ,  Endo/Other  negative endocrine ROS  Renal/GU negative Renal ROS     Musculoskeletal  (+) Arthritis -, Osteoarthritis,    Abdominal (+)  Abdomen: tender.    Peds  Hematology negative hematology ROS (+)   Anesthesia Other Findings   Reproductive/Obstetrics                           Anesthesia Physical Anesthesia Plan  ASA: II  Anesthesia Plan: General   Post-op Pain Management:    Induction: Intravenous, Rapid sequence and Cricoid pressure planned  Airway Management Planned: Oral ETT and Video Laryngoscope Planned  Additional Equipment:   Intra-op Plan:   Post-operative Plan: Extubation in OR  Informed Consent: I have reviewed the patients History and Physical, chart, labs and discussed the procedure including the risks, benefits and alternatives for the proposed anesthesia with the patient or authorized representative who has indicated his/her understanding and acceptance.   Dental advisory given  Plan Discussed with: CRNA  Anesthesia Plan Comments: (Use smaller endotracheal tube 7.0 mm or less. Will need Glidescope.)        Anesthesia Quick Evaluation

## 2011-11-01 LAB — CBC
Hemoglobin: 13.5 g/dL (ref 12.0–15.0)
MCH: 31.1 pg (ref 26.0–34.0)
MCHC: 34.3 g/dL (ref 30.0–36.0)
MCV: 90.8 fL (ref 78.0–100.0)
RBC: 4.34 MIL/uL (ref 3.87–5.11)

## 2011-11-01 LAB — BASIC METABOLIC PANEL
CO2: 28 mEq/L (ref 19–32)
Calcium: 8.4 mg/dL (ref 8.4–10.5)
Creatinine, Ser: 1.2 mg/dL — ABNORMAL HIGH (ref 0.50–1.10)
Glucose, Bld: 97 mg/dL (ref 70–99)

## 2011-11-01 MED ORDER — HYDROMORPHONE HCL PF 1 MG/ML IJ SOLN
1.0000 mg | INTRAMUSCULAR | Status: DC | PRN
  Administered 2011-11-01: 2 mg via INTRAVENOUS
  Administered 2011-11-01: 1 mg via INTRAVENOUS
  Administered 2011-11-01 – 2011-11-03 (×2): 2 mg via INTRAVENOUS
  Administered 2011-11-03: 1 mg via INTRAVENOUS
  Administered 2011-11-03 (×2): 2 mg via INTRAVENOUS
  Administered 2011-11-03 – 2011-11-04 (×4): 1 mg via INTRAVENOUS
  Administered 2011-11-05: 2 mg via INTRAVENOUS
  Administered 2011-11-05 (×2): 1 mg via INTRAVENOUS
  Administered 2011-11-05 (×2): 2 mg via INTRAVENOUS
  Administered 2011-11-06 – 2011-11-09 (×14): 1 mg via INTRAVENOUS
  Filled 2011-11-01: qty 2
  Filled 2011-11-01: qty 1
  Filled 2011-11-01: qty 3
  Filled 2011-11-01: qty 1
  Filled 2011-11-01: qty 2
  Filled 2011-11-01 (×2): qty 1
  Filled 2011-11-01 (×2): qty 2
  Filled 2011-11-01 (×9): qty 1
  Filled 2011-11-01: qty 2
  Filled 2011-11-01 (×3): qty 1
  Filled 2011-11-01: qty 2
  Filled 2011-11-01 (×3): qty 1
  Filled 2011-11-01 (×2): qty 2
  Filled 2011-11-01: qty 1

## 2011-11-01 MED ORDER — SODIUM CHLORIDE 0.9 % IJ SOLN
INTRAMUSCULAR | Status: AC
  Administered 2011-11-01: 9 mL via INTRAVENOUS
  Filled 2011-11-01: qty 3

## 2011-11-01 MED ORDER — SODIUM CHLORIDE 0.9 % IJ SOLN
INTRAMUSCULAR | Status: AC
  Filled 2011-11-01: qty 3

## 2011-11-01 MED ORDER — PHENOL 1.4 % MT LIQD
1.0000 | OROMUCOSAL | Status: DC | PRN
  Filled 2011-11-01: qty 177

## 2011-11-01 NOTE — Progress Notes (Signed)
1 Day Post-Op  Subjective: No flatus. No nausea.  Throat sore.  Pain controlled.  Objective: Vital signs in last 24 hours: Temp:  [97.4 F (36.3 C)-98.8 F (37.1 C)] 98.8 F (37.1 C) (08/06 0930) Pulse Rate:  [67-99] 99  (08/06 0930) Resp:  [13-24] 18  (08/06 0930) BP: (76-133)/(53-76) 96/62 mmHg (08/06 0930) SpO2:  [89 %-100 %] 95 % (08/06 0930) FiO2 (%):  [92 %] 92 % (08/05 1542) Last BM Date: 10/30/11  Intake/Output from previous day: 08/05 0701 - 08/06 0700 In: 4663.8 [I.V.:4663.8] Out: 2150 [Urine:200; Emesis/NG output:900; Blood:150] Intake/Output this shift: Total I/O In: 440 [I.V.:440] Out: -   General appearance: alert and no distress GI: quiet.  Expected tenderness.  Incision dressed.  Clean and intact.  No peritoneal signs.    Lab Results:   Basename 11/01/11 0504 10/31/11 0520  WBC 27.3* 14.5*  HGB 13.5 12.5  HCT 39.4 36.6  PLT 439* 502*   BMET  Basename 11/01/11 0504 10/31/11 0520  NA 136 132*  K 4.8 3.6  CL 94* 93*  CO2 28 23  GLUCOSE 97 98  BUN 14 10  CREATININE 1.20* 0.66  CALCIUM 8.4 8.7   PT/INR No results found for this basename: LABPROT:2,INR:2 in the last 72 hours ABG No results found for this basename: PHART:2,PCO2:2,PO2:2,HCO3:2 in the last 72 hours  Studies/Results: No results found.  Anti-infectives: Anti-infectives     Start     Dose/Rate Route Frequency Ordered Stop   10/31/11 1030   metroNIDAZOLE (FLAGYL) IVPB 500 mg  Status:  Discontinued        500 mg 100 mL/hr over 60 Minutes Intravenous  Once 10/31/11 1018 10/31/11 1419   10/31/11 1018   ciprofloxacin (CIPRO) IVPB 400 mg        400 mg 200 mL/hr over 60 Minutes Intravenous 120 min pre-op 10/31/11 1018 10/31/11 1138   10/31/11 0000   metroNIDAZOLE (FLAGYL) IVPB 500 mg  Status:  Discontinued        500 mg 100 mL/hr over 60 Minutes Intravenous  Once 10/30/11 1419 10/30/11 1423   10/30/11 1419   ciprofloxacin (CIPRO) IVPB 400 mg  Status:  Discontinued        400  mg 200 mL/hr over 60 Minutes Intravenous 120 min pre-op 10/30/11 1419 10/30/11 1422          Assessment/Plan: s/p Procedure(s) (LRB): EXPLORATORY LAPAROTOMY (N/A) SMALL BOWEL RESECTION () Await for return of bowel function.  Increase activity.  D/c Foley.  Continue NG.  LOS: 6 days    Caitlin Alexander C 11/01/2011

## 2011-11-01 NOTE — Addendum Note (Signed)
Addendum  created 11/01/11 0746 by Despina Hidden, CRNA   Modules edited:Notes Section

## 2011-11-01 NOTE — Progress Notes (Signed)
INITIAL ADULT NUTRITION ASSESSMENT Date: 11/01/2011   Time: 9:50 AM Reason for Assessment: LOS  ASSESSMENT: Female 76 y.o.  Dx: <principal problem not specified>  Hx:  Past Medical History  Diagnosis Date  . GERD (gastroesophageal reflux disease)   . Hyperlipidemia   . Anxiety   . Arthritis   . Osteoporosis    Past Surgical History  Procedure Date  . Abdominal surgery   . Appendectomy   . Abdominal hysterectomy   . Tubal ligation   . Fissurectomy   . Nasal sinus surgery    Related Meds:  Scheduled Meds:   . ciprofloxacin  400 mg Intravenous 120 min pre-op  . enoxaparin  40 mg Subcutaneous Q24H  . glycopyrrolate  0.2 mg Intravenous Once  . HYDROmorphone PCA 0.3 mg/mL   Intravenous Q4H  . ondansetron (ZOFRAN) IV  4 mg Intravenous Once  . pantoprazole (PROTONIX) IV  40 mg Intravenous QHS  . sodium chloride      . DISCONTD: metronidazole  500 mg Intravenous Once   Continuous Infusions:   . lactated ringers 110 mL/hr at 10/31/11 1434  . DISCONTD: lactated ringers     PRN Meds:.diphenhydrAMINE, diphenhydrAMINE, HYDROmorphone (DILAUDID) injection, naloxone, ondansetron, ondansetron (ZOFRAN) IV, sodium chloride, DISCONTD: fentaNYL, DISCONTD: metoCLOPramide (REGLAN) injection, DISCONTD: midazolam, DISCONTD:  morphine injection, DISCONTD: ondansetron (ZOFRAN) IV, DISCONTD: sodium chloride irrigation  Ht: 5\' 2"  (157.5 cm)  Wt: 135 lb (61.236 kg)  Ideal Wt: 50.1 kg  % Ideal Wt: 132%  Usual Wt: 135# % Usual Wt: 100%  Body mass index is 24.69 kg/(m^2). Classified as normal weight.   Food/Nutrition Related Hx: Pt admitted for SBO. Pt s/p ex lap, small bowel resection on 10/31/11. Pt with NGT for decompression. Pt has been on NPO/clears x 6 days. Noted pt was upgraded to clear liquids on 10/30/11, but unable to tolerate due to significant abdominal pain and was downgraded back to NPO.   Labs:  CMP     Component Value Date/Time   NA 136 11/01/2011 0504   K 4.8 11/01/2011 0504     CL 94* 11/01/2011 0504   CO2 28 11/01/2011 0504   GLUCOSE 97 11/01/2011 0504   BUN 14 11/01/2011 0504   CREATININE 1.20* 11/01/2011 0504   CALCIUM 8.4 11/01/2011 0504   PROT 8.0 10/26/2011 1958   ALBUMIN 3.7 10/26/2011 1958   AST 50* 10/26/2011 1958   ALT 143* 10/26/2011 1958   ALKPHOS 221* 10/26/2011 1958   BILITOT 1.0 10/26/2011 1958   GFRNONAA 43* 11/01/2011 0504   GFRAA 50* 11/01/2011 0504     Intake:   Intake/Output Summary (Last 24 hours) at 11/01/11 1008 Last data filed at 10/31/11 1847  Gross per 24 hour  Intake   3320 ml  Output   2150 ml  Net   1170 ml    Diet Order:  NPO  Supplements/Tube Feeding: none at this time  IVF:    lactated ringers Last Rate: 110 mL/hr at 10/31/11 1434  DISCONTD: lactated ringers     Estimated Nutritional Needs:   Kcal:1304-1423 kcals daily Protein:49-61 grams protein daily Fluid:1.3-1.4 L fluid daily  NUTRITION DIAGNOSIS: -Inadequate oral intake (NI-2.1).  Status: Ongoing  RELATED TO: altered GI function  AS EVIDENCE BY: SBO, NPO.   MONITORING/EVALUATION(Goals): 1) Pt will maintain current weight of 135# 2) Pt will meet >75% of estimated energy needs 3) Pt will be upgraded to PO diet, as medically appropriate  EDUCATION NEEDS: -Education not appropriate at this time  INTERVENTION: Follow for  diet advancement and diet tolerance. If PO diet continues to be contraindicated, may need to consider initiation of TNA.   Dietitian #: [phone]  DOCUMENTATION CODES Per approved criteria  -Not Applicable    Melody Haver, RD, LDN 11/01/2011, 9:50 AM

## 2011-11-01 NOTE — Progress Notes (Signed)
UR chart review completed.  

## 2011-11-01 NOTE — Progress Notes (Signed)
NAME:  ROCHELLE, LARUE                      ACCOUNT NO.:  MEDICAL RECORD NO.:  [id number]  LOCATION:                                 FACILITY:  PHYSICIAN:  Kingsley Callander. Ouida Sills, MD            DATE OF BIRTH:  DATE OF PROCEDURE:  11/01/2011 DATE OF DISCHARGE:                                PROGRESS NOTE   HISTORY OF PRESENT ILLNESS:  [name] has multiple complaints about her oxygen and nasogastric tube in her nose this morning.  She seems to have good control of her abdominal pain.  She was reasonably comfortable yesterday afternoon after her surgery.  She has had no fever.  PHYSICAL EXAMINATION:  VITAL SIGNS:  Temperature is 98.2 this morning, with a pulse of 94, and blood pressure of 121/74. LUNGS:  Clear. HEART:  Regular with no murmurs. ABDOMEN:  Nondistended.  Her dressing is dry and in place.  Bowel sounds are hypoactive.  IMPRESSION/PLAN:  Postop day #1, status post small bowel resection with primary side-to-side anastomosis.  She has no fever but has an elevated white count this morning of 27.3, hemoglobin is 13.5.  BUN and creatinine are 14 and 1.20 with a potassium of 4.8, and sodium of 136. She is on a Dilaudid PCA, which is working well.  She has had the NG output of approximately 900 mL.  I's and O's were positive 1170.  She is cooperating with incentive spirometry.  Her general condition appears stable this morning.     Kingsley Callander. Ouida Sills, MD     ROF/MEDQ  D:  11/01/2011  T:  11/01/2011  Job:  [id number]

## 2011-11-01 NOTE — Anesthesia Postprocedure Evaluation (Signed)
  Anesthesia Post-op Note  Patient: Caitlin Alexander  Procedure(s) Performed: Procedure(s) (LRB): EXPLORATORY LAPAROTOMY (N/A) SMALL BOWEL RESECTION ()  Patient Location: room 323  Anesthesia Type: General  Level of Consciousness: awake, alert , oriented and patient cooperative  Airway and Oxygen Therapy: Patient Spontanous Breathing and Patient connected to nasal cannula oxygen  Post-op Pain: 3 /10, mild  Post-op Assessment: Post-op Vital signs reviewed, Patient's Cardiovascular Status Stable, Respiratory Function Stable, Patent Airway, No signs of Nausea or vomiting and Pain level controlled  Post-op Vital Signs: Reviewed and stable  Complications: No apparent anesthesia complications

## 2011-11-02 LAB — BASIC METABOLIC PANEL
Calcium: 8.4 mg/dL (ref 8.4–10.5)
Creatinine, Ser: 0.74 mg/dL (ref 0.50–1.10)
GFR calc Af Amer: >90 mL/min (ref >90)
GFR calc non Af Amer: 82 mL/min — ABNORMAL LOW (ref >90)

## 2011-11-02 LAB — CBC
MCH: 30.9 pg (ref 26.0–34.0)
MCHC: 34.7 g/dL (ref 30.0–36.0)
MCV: 89.1 fL (ref 78.0–100.0)
Platelets: 457 10*3/uL — ABNORMAL HIGH (ref 150–400)
RDW: 13.5 % (ref 11.5–15.5)

## 2011-11-02 MED ORDER — LEVOFLOXACIN IN D5W 500 MG/100ML IV SOLN
500.0000 mg | INTRAVENOUS | Status: DC
  Administered 2011-11-02 – 2011-11-09 (×8): 500 mg via INTRAVENOUS
  Filled 2011-11-02 (×9): qty 100

## 2011-11-02 MED ORDER — POTASSIUM CHLORIDE 10 MEQ/100ML IV SOLN
10.0000 meq | INTRAVENOUS | Status: DC
  Administered 2011-11-02 (×5): 10 meq via INTRAVENOUS
  Filled 2011-11-02: qty 600

## 2011-11-02 MED ORDER — METRONIDAZOLE IN NACL 5-0.79 MG/ML-% IV SOLN
500.0000 mg | Freq: Four times a day (QID) | INTRAVENOUS | Status: DC
  Administered 2011-11-02 – 2011-11-09 (×29): 500 mg via INTRAVENOUS
  Filled 2011-11-02 (×33): qty 100

## 2011-11-02 NOTE — Progress Notes (Signed)
2 Days Post-Op  Subjective: No complaints of any nausea. Pain is controlled. No fevers or chills. No flatus. No abdominal" rumbling".  Objective: Vital signs in last 24 hours: Temp:  [97.5 F (36.4 C)-98.6 F (37 C)] 97.9 F (36.6 C) (08/07 0425) Pulse Rate:  [87-101] 101  (08/07 0425) Resp:  [18-24] 20  (08/07 0425) BP: (98-123)/(63-82) 123/82 mmHg (08/07 0425) SpO2:  [90 %-98 %] 94 % (08/07 0743) Last BM Date: 10/30/11  Intake/Output from previous day: 08/06 0701 - 08/07 0700 In: 1410 [P.O.:200; I.V.:1210] Out: 1000 [Urine:500; Emesis/NG output:500] Intake/Output this shift:    General appearance: alert and no distress GI: Quiet, flat, moderate expected postoperative tenderness. Incision is clean dry and intact. Staples are in place. No peritoneal signs.  Lab Results:   Basename 11/02/11 0504 11/01/11 0504  WBC 26.6* 27.3*  HGB 11.9* 13.5  HCT 34.3* 39.4  PLT 457* 439*   BMET  Basename 11/02/11 0504 11/01/11 0504  NA 133* 136  K 3.3* 4.8  CL 95* 94*  CO2 26 28  GLUCOSE 93 97  BUN 13 14  CREATININE 0.74 1.20*  CALCIUM 8.4 8.4   PT/INR No results found for this basename: LABPROT:2,INR:2 in the last 72 hours ABG No results found for this basename: PHART:2,PCO2:2,PO2:2,HCO3:2 in the last 72 hours  Studies/Results: No results found.  Anti-infectives: Anti-infectives     Start     Dose/Rate Route Frequency Ordered Stop   11/02/11 0900   levofloxacin (LEVAQUIN) IVPB 500 mg        500 mg 100 mL/hr over 60 Minutes Intravenous Every 24 hours 11/02/11 0750     11/02/11 0900   metroNIDAZOLE (FLAGYL) IVPB 500 mg        500 mg 100 mL/hr over 60 Minutes Intravenous Every 6 hours 11/02/11 0750     10/31/11 1030   metroNIDAZOLE (FLAGYL) IVPB 500 mg  Status:  Discontinued        500 mg 100 mL/hr over 60 Minutes Intravenous  Once 10/31/11 1018 10/31/11 1419   10/31/11 1018   ciprofloxacin (CIPRO) IVPB 400 mg        400 mg 200 mL/hr over 60 Minutes Intravenous  120 min pre-op 10/31/11 1018 10/31/11 1138   10/31/11 0000   metroNIDAZOLE (FLAGYL) IVPB 500 mg  Status:  Discontinued        500 mg 100 mL/hr over 60 Minutes Intravenous  Once 10/30/11 1419 10/30/11 1423   10/30/11 1419   ciprofloxacin (CIPRO) IVPB 400 mg  Status:  Discontinued        400 mg 200 mL/hr over 60 Minutes Intravenous 120 min pre-op 10/30/11 1419 10/30/11 1422          Assessment/Plan: s/p Procedure(s) (LRB): EXPLORATORY LAPAROTOMY (N/A) SMALL BOWEL RESECTION () Overall doing well. Suspect component of a postoperative ileus. Continue nasogastric tube decompression. At this point there is a significant amount of nasogastric return and I do suspect continued adynamic ileus contributing to her symptoms and progress. At this point we'll continue to await for bowel function improvement. Should she continue to have limited to improvement by Friday we'll plan to start TPN at that time however I am optimistic that she will turn the corner well before then. Continue ambulation. Overall patient is doing well. Empiric antibiotics were initiated G-tube persistent leukocytosis however this may also represent postoperative changes.  LOS: 7 days    Caitlin Alexander C 11/02/2011

## 2011-11-02 NOTE — Progress Notes (Signed)
[name], [name]                ACCOUNT NO.:  [id number]  MEDICAL RECORD NO.:  [id number]  LOCATION:  RAD                           FACILITY:  [facility]  PHYSICIAN:  Kingsley Callander. Ouida Sills, MD       DATE OF BIRTH:  06/12/1935  DATE OF PROCEDURE: DATE OF DISCHARGE:  10/26/2011                                PROGRESS NOTE   [name] biggest concern is that she was not allowed to get up and walk around last night.  She strongly wants to move around more.  Her pain has been well controlled.  She denies passage of any gas or stool. She does not have any nausea.  She has had no fever, temperature was 97.9 this morning, pulse of 101, blood pressure of 123/82.  Lungs clear.  Heart regular with no murmurs. Abdomen is soft and nondistended.  Her dressing is dry and intact.  No bowel sounds are audible.  IMPRESSIONS/PLAN: 1. Postop day 2 status post small bowel resection.  She appears     comfortable.  We will have her ambulate with assistance.  She has     persistent leukocytosis now at 26.6, differential is pending.  She     remains on nasogastric suction. 2. Hypokalemia.  Potassium is dropped to 3.3, we will supplement it     intravenously. 3. Creatinine has dropped back from 1.2 to 0.74, BUN of 13, sodium is     133.     Kingsley Callander. Ouida Sills, MD     ROF/MEDQ  D:  11/02/2011  T:  11/02/2011  Job:  [id number]

## 2011-11-02 NOTE — Progress Notes (Signed)
Pt ambulated in hallway from room to nurses station and back with nurse. Pt did not complain of any dizziness or feeling lightheaded. Pt slightly unstable on feet. Encouraged pt to ambulate again several more times in hallway with RN or NT. Pt stated she understood and agreed to walk again around noon.

## 2011-11-03 ENCOUNTER — Inpatient Hospital Stay (HOSPITAL_COMMUNITY): Payer: Medicare Other

## 2011-11-03 LAB — URINE MICROSCOPIC-ADD ON

## 2011-11-03 LAB — BASIC METABOLIC PANEL
GFR calc Af Amer: >90 mL/min (ref >90)
GFR calc non Af Amer: 87 mL/min — ABNORMAL LOW (ref >90)
Potassium: 3.5 mEq/L (ref 3.5–5.1)
Sodium: 134 mEq/L — ABNORMAL LOW (ref 135–145)

## 2011-11-03 LAB — CBC
Hemoglobin: 10.5 g/dL — ABNORMAL LOW (ref 12.0–15.0)
MCHC: 35.1 g/dL (ref 30.0–36.0)
RDW: 13.6 % (ref 11.5–15.5)
WBC: 28.7 10*3/uL — ABNORMAL HIGH (ref 4.0–10.5)

## 2011-11-03 LAB — URINALYSIS, ROUTINE W REFLEX MICROSCOPIC
Glucose, UA: NEGATIVE mg/dL
Specific Gravity, Urine: 1.015 (ref 1.005–1.030)
Urobilinogen, UA: 0.2 mg/dL (ref 0.0–1.0)
pH: 6 (ref 5.0–8.0)

## 2011-11-03 MED ORDER — POTASSIUM CHLORIDE 10 MEQ/100ML IV SOLN
10.0000 meq | INTRAVENOUS | Status: AC
  Administered 2011-11-03 (×4): 10 meq via INTRAVENOUS
  Filled 2011-11-03 (×2): qty 100
  Filled 2011-11-03: qty 200

## 2011-11-03 NOTE — Progress Notes (Signed)
Caitlin Alexander, Caitlin Alexander                ACCOUNT NO.:  [id number]  MEDICAL RECORD NO.:  [id number]  LOCATION:                                 FACILITY:  PHYSICIAN:  Kingsley Callander. Ouida Sills, MD       DATE OF BIRTH:  1935/04/18  DATE OF PROCEDURE:  11/03/2011 DATE OF DISCHARGE:                                PROGRESS NOTE   SUBJECTIVE:  Ms. Tooley is feeling stronger.  She required pain medication once last night.  She denies any pain now.  She has been able to get up and walk about without difficulty.  She states she felt some rumbling in her abdomen this morning, but has not yet passed any gas. She is not experiencing nausea.  She has not had any fever.  PHYSICAL EXAMINATION:  VITAL SIGNS:  Temperature is 98 with a pulse of 75 and blood pressure of 122/73. GENERAL:  She looks very comfortable. LUNGS:  Clear. HEART:  Regular with no murmurs. ABDOMEN:  Soft and nondistended.  Her wound is healing well without signs of infection.  IMPRESSION/PLAN:  Postoperative day 3, no appreciable bowel sounds are noted yet today.  She has a persistent leukocytosis now at 28.7.  She is on antibiotic therapy with Levaquin and metronidazole.  Hemoglobin is 10.5.  Hypokalemia has normalized to 3.5 with IV potassium supplementation.  Her renal function is normal with a BUN and creatinine of 9 and 0.62.  She remains on nasogastric suction and had an output yesterday of 1600.  We will continue additional potassium supplementation today.     Kingsley Callander. Ouida Sills, MD     ROF/MEDQ  D:  11/03/2011  T:  11/03/2011  Job:  [id number]

## 2011-11-03 NOTE — Progress Notes (Signed)
Dr. Mariel Craft with pt at bedside. Order received to clamp NGT at this time and discontinue low-intermittent suction.  Will continue to monitor.

## 2011-11-04 ENCOUNTER — Encounter (HOSPITAL_COMMUNITY): Payer: Medicare Other

## 2011-11-04 ENCOUNTER — Inpatient Hospital Stay (HOSPITAL_COMMUNITY): Payer: Medicare Other

## 2011-11-04 LAB — CBC
Hemoglobin: 10.9 g/dL — ABNORMAL LOW (ref 12.0–15.0)
MCHC: 35.3 g/dL (ref 30.0–36.0)
RBC: 3.52 MIL/uL — ABNORMAL LOW (ref 3.87–5.11)

## 2011-11-04 LAB — BASIC METABOLIC PANEL
GFR calc non Af Amer: 90 mL/min — ABNORMAL LOW (ref >90)
Glucose, Bld: 81 mg/dL (ref 70–99)
Potassium: 3.2 mEq/L — ABNORMAL LOW (ref 3.5–5.1)
Sodium: 134 mEq/L — ABNORMAL LOW (ref 135–145)

## 2011-11-04 LAB — GLUCOSE, CAPILLARY

## 2011-11-04 MED ORDER — POTASSIUM CHLORIDE IN NACL 40-0.9 MEQ/L-% IV SOLN
INTRAVENOUS | Status: DC
  Administered 2011-11-04 – 2011-11-05 (×2): via INTRAVENOUS
  Administered 2011-11-07: 20 mL/h via INTRAVENOUS
  Administered 2011-11-09: 11:00:00 via INTRAVENOUS
  Filled 2011-11-04 (×14): qty 1000

## 2011-11-04 MED ORDER — POTASSIUM CHLORIDE 10 MEQ/100ML IV SOLN
10.0000 meq | INTRAVENOUS | Status: AC
  Administered 2011-11-04 (×3): 10 meq via INTRAVENOUS
  Filled 2011-11-04: qty 100

## 2011-11-04 MED ORDER — POTASSIUM CHLORIDE 10 MEQ/100ML IV SOLN
10.0000 meq | Freq: Once | INTRAVENOUS | Status: AC
  Administered 2011-11-04: 10 meq via INTRAVENOUS
  Filled 2011-11-04: qty 100

## 2011-11-04 MED ORDER — FAT EMULSION 20 % IV EMUL
250.0000 mL | INTRAVENOUS | Status: AC
  Administered 2011-11-04: 250 mL via INTRAVENOUS
  Filled 2011-11-04: qty 250

## 2011-11-04 MED ORDER — INSULIN ASPART 100 UNIT/ML ~~LOC~~ SOLN
0.0000 [IU] | Freq: Four times a day (QID) | SUBCUTANEOUS | Status: DC
Start: 2011-11-05 — End: 2011-11-09
  Administered 2011-11-05 – 2011-11-07 (×7): 1 [IU] via SUBCUTANEOUS
  Administered 2011-11-08: 2 [IU] via SUBCUTANEOUS
  Administered 2011-11-08 (×2): 1 [IU] via SUBCUTANEOUS
  Administered 2011-11-08 – 2011-11-09 (×2): 2 [IU] via SUBCUTANEOUS

## 2011-11-04 MED ORDER — POTASSIUM CHLORIDE 10 MEQ/100ML IV SOLN
INTRAVENOUS | Status: AC
  Filled 2011-11-04: qty 100

## 2011-11-04 MED ORDER — M.V.I. ADULT IV INJ
INTRAVENOUS | Status: AC
  Administered 2011-11-04: 17:00:00 via INTRAVENOUS
  Filled 2011-11-04: qty 2000

## 2011-11-04 MED ORDER — SODIUM CHLORIDE 0.9 % IJ SOLN
10.0000 mL | INTRAMUSCULAR | Status: DC | PRN
  Filled 2011-11-04 (×2): qty 3
  Filled 2011-11-04: qty 6

## 2011-11-04 MED ORDER — SODIUM CHLORIDE 0.9 % IJ SOLN
10.0000 mL | Freq: Two times a day (BID) | INTRAMUSCULAR | Status: DC
  Administered 2011-11-04 – 2011-11-06 (×3): 10 mL
  Administered 2011-11-07: 20 mL
  Administered 2011-11-07 – 2011-11-09 (×4): 10 mL
  Filled 2011-11-04 (×3): qty 6
  Filled 2011-11-04: qty 3

## 2011-11-04 NOTE — Progress Notes (Signed)
4 Days Post-Op  Subjective: Still with nausea.  Failed clamping as I expected.  No flatus.  No pain.  No fever or chills.  Patient still adamant about starting MiraLax.    Objective: Vital signs in last 24 hours: Temp:  [97.8 F (36.6 Alexander)-98.1 F (36.7 Alexander)] 98 F (36.7 Alexander) (08/09 0618) Pulse Rate:  [75-82] 75  (08/09 0618) Resp:  [18-20] 20  (08/09 0618) BP: (119-138)/(71-79) 138/79 mmHg (08/09 0618) SpO2:  [90 %-95 %] 95 % (08/09 0618) Last BM Date: 10/30/11  Intake/Output from previous day: 08/08 0701 - 08/09 0700 In: 1906.8 [P.O.:120; I.V.:1486.8; IV Piggyback:300] Out: 3825 [Urine:3275; Emesis/NG output:550] Intake/Output this shift: Total I/O In: -  Out: 200 [Urine:200]  General appearance: alert and no distress Resp: full.  Bilateral basilar crackles. GI: Quiet.  Flat.  Moderated expected post-operative tenderness.  No peritoneal signs.  incision is clean dry and intact.  Staples in place.  Lab Results:   Basename 11/04/11 0513 11/03/11 0453  WBC 24.1* 28.7*  HGB 10.9* 10.5*  HCT 30.9* 29.9*  PLT 493* 459*   BMET  Basename 11/04/11 0513 11/03/11 0453  NA 134* 134*  K 3.2* 3.5  CL 97 96  CO2 26 26  GLUCOSE 81 74  BUN 7 9  CREATININE 0.56 0.62  CALCIUM 8.1* 8.3*   PT/INR No results found for this basename: LABPROT:2,INR:2 in the last 72 hours ABG No results found for this basename: PHART:2,PCO2:2,PO2:2,HCO3:2 in the last 72 hours  Studies/Results: Dg Chest 2 View  11/03/2011  *RADIOLOGY REPORT*  Clinical Data: Leukocytosis postoperatively  CHEST - 2 VIEW  Comparison: 11/08/2007  Findings: The cardiac shadow is stable.  Bibasilar atelectasis/infiltrate is identified. Mild pleural effusion is noted bilaterally.  A nasogastric catheter seen with proximal side port in the distal esophagus.  The tip lies within the stomach.  IMPRESSION: Bibasilar changes.  Given the patient's leukocytosis these are likely related to acute pneumonic infiltrate.  Original Report  Authenticated By: Phillips Odor, M.D.    Anti-infectives: Anti-infectives     Start     Dose/Rate Route Frequency Ordered Stop   11/02/11 0900   levofloxacin (LEVAQUIN) IVPB 500 mg        500 mg 100 mL/hr over 60 Minutes Intravenous Every 24 hours 11/02/11 0750     11/02/11 0900   metroNIDAZOLE (FLAGYL) IVPB 500 mg        500 mg 100 mL/hr over 60 Minutes Intravenous Every 6 hours 11/02/11 0750     10/31/11 1030   metroNIDAZOLE (FLAGYL) IVPB 500 mg  Status:  Discontinued        500 mg 100 mL/hr over 60 Minutes Intravenous  Once 10/31/11 1018 10/31/11 1419   10/31/11 1018   ciprofloxacin (CIPRO) IVPB 400 mg        400 mg 200 mL/hr over 60 Minutes Intravenous 120 min pre-op 10/31/11 1018 10/31/11 1138   10/31/11 0000   metroNIDAZOLE (FLAGYL) IVPB 500 mg  Status:  Discontinued        500 mg 100 mL/hr over 60 Minutes Intravenous  Once 10/30/11 1419 10/30/11 1423   10/30/11 1419   ciprofloxacin (CIPRO) IVPB 400 mg  Status:  Discontinued        400 mg 200 mL/hr over 60 Minutes Intravenous 120 min pre-op 10/30/11 1419 10/30/11 1422          Assessment/Plan: s/p Procedure(s) (LRB): EXPLORATORY LAPAROTOMY (N/A) SMALL BOWEL RESECTION () Post-operative ileus.  Hypokalemia.  Replace.  Continue NG  for now.  Will place PICC for initiation of TPN.  Continue ambulation.  Long discussion with patient why she would not have improvements at this time with Miralax.  Continue antibiotic for pneumonia coverage.  Some response with her WBC count.  Blood cultures are still pending.  LOS: 9 days    Caitlin Alexander 11/04/2011

## 2011-11-04 NOTE — Progress Notes (Signed)
Nutrition Follow-up  Intervention:    Agree with TPN  Obtain current wt   Assessment:   Pt has NGT ~546ml output per RN. Unable to advance diet at this time. Diet Order: NPO pending return of bowel function. TPN initiated. Will request current wt be obtained in order to assess pt for significant wt change and suspected  malnutrition given her being medically unable to meet nutritional needs orally. NPO/Clear Liquids>8d.  Meds: Scheduled Meds:   . enoxaparin  40 mg Subcutaneous Q24H  . levofloxacin (LEVAQUIN) IV  500 mg Intravenous Q24H  . metronidazole  500 mg Intravenous Q6H  . pantoprazole (PROTONIX) IV  40 mg Intravenous QHS  . potassium chloride  10 mEq Intravenous Q1 Hr x 4  . potassium chloride  10 mEq Intravenous Q1 Hr x 4   Continuous Infusions:   . 0.9 % NaCl with KCl 40 mEq / L    . DISCONTD: lactated ringers 110 mL/hr at 11/03/11 1649   PRN Meds:.diphenhydrAMINE, diphenhydrAMINE, HYDROmorphone (DILAUDID) injection, naloxone, ondansetron, ondansetron (ZOFRAN) IV, phenol, sodium chloride  Labs:  CMP     Component Value Date/Time   NA 134* 11/04/2011 0513   K 3.2* 11/04/2011 0513   CL 97 11/04/2011 0513   CO2 26 11/04/2011 0513   GLUCOSE 81 11/04/2011 0513   BUN 7 11/04/2011 0513   CREATININE 0.56 11/04/2011 0513   CALCIUM 8.1* 11/04/2011 0513   PROT 8.0 10/26/2011 1958   ALBUMIN 3.7 10/26/2011 1958   AST 50* 10/26/2011 1958   ALT 143* 10/26/2011 1958   ALKPHOS 221* 10/26/2011 1958   BILITOT 1.0 10/26/2011 1958   GFRNONAA 90* 11/04/2011 0513   GFRAA >90 11/04/2011 0513     Intake/Output Summary (Last 24 hours) at 11/04/11 1046 Last data filed at 11/04/11 0900  Gross per 24 hour  Intake 1686.83 ml  Output   3225 ml  Net -1538.17 ml    Weight Status: 135# (61 kg) 10/26/11  Re-estimated needs:   1500-1800 kcal 80-90 gr/day 1 ml/kcal  Nutrition Dx: Inadequate oral intake continues r/t altered GI function AEB NPO status (awaiting return of bowel function)  Goal: Pt to meet  >/= 90% of their estimated nutrition needs; not met  Monitor: pt progression/transition to oral nutrition and assess adequacy    Royann Shivers MS,RD,LDN,CSG Office: [phone] Pager: [phone]

## 2011-11-04 NOTE — Progress Notes (Addendum)
PARENTERAL NUTRITION CONSULT NOTE - INITIAL  Pharmacy Consult for TPN Indication: Prolonged Ileus - post operative Ileus  Allergies  Allergen Reactions  . Macrodantin (Nitrofurantoin Macrocrystal)   . Promethazine   . Sulfa Antibiotics   . Suprax (Cefixime)     Patient Measurements: Height: 5\' 2"  (157.5 cm) Weight: 135 lb (61.236 kg) IBW/kg (Calculated) : 50.1    Vital Signs: Temp: 98 F (36.7 C) (08/09 0618) Temp src: Oral (08/09 0618) BP: 138/79 mmHg (08/09 0618) Pulse Rate: 75  (08/09 0618) Intake/Output from previous day: 08/08 0701 - 08/09 0700 In: 1906.8 [P.O.:120; I.V.:1486.8; IV Piggyback:300] Out: 3825 [Urine:3275; Emesis/NG output:550] Intake/Output from this shift: Total I/O In: -  Out: 200 [Urine:200]  Labs:  [facility] 11/04/11 0513 11/03/11 0453 11/02/11 0504  WBC 24.1* 28.7* 26.6*  HGB 10.9* 10.5* 11.9*  HCT 30.9* 29.9* 34.3*  PLT 493* 459* 457*  APTT -- -- --  INR -- -- --     Basename 11/04/11 0513 11/03/11 0453 11/02/11 0504  NA 134* 134* 133*  K 3.2* 3.5 3.3*  CL 97 96 95*  CO2 26 26 26   GLUCOSE 81 74 93  BUN 7 9 13   CREATININE 0.56 0.62 0.74  LABCREA -- -- --  CREAT24HRUR -- -- --  CALCIUM 8.1* 8.3* 8.4  MG -- -- --  PHOS -- -- --  PROT -- -- --  ALBUMIN -- -- --  AST -- -- --  ALT -- -- --  ALKPHOS -- -- --  BILITOT -- -- --  BILIDIR -- -- --  IBILI -- -- --  PREALBUMIN -- -- --  TRIG -- -- --  CHOLHDL -- -- --  CHOL -- -- --   Estimated Creatinine Clearance: 53.1 ml/min (by C-G formula based on Cr of 0.56).   No results found for this basename: GLUCAP:3 in the last 72 hours  Medical History: Past Medical History  Diagnosis Date  . GERD (gastroesophageal reflux disease)   . Hyperlipidemia   . Anxiety   . Arthritis   . Osteoporosis     Medications:  Scheduled:    . enoxaparin  40 mg Subcutaneous Q24H  . levofloxacin (LEVAQUIN) IV  500 mg Intravenous Q24H  . metronidazole  500 mg Intravenous Q6H  .  pantoprazole (PROTONIX) IV  40 mg Intravenous QHS  . potassium chloride  10 mEq Intravenous Q1 Hr x 4  . potassium chloride  10 mEq Intravenous Q1 Hr x 4    Insulin Requirements in the past 24 hours:  n/a  Current Nutrition:  NPO  Assessment: Potassium 3.2, MD has ordered KCL IV runs  Nutritional Goals:  1500 - 1800 kCal per day  80 - 90 grams of protein per day 1 ml/kcal  Plan:  Start Clinimix 5/15 E at 30 ml/hr (goal rate 60 ml/hr) Lipids 20% 250 ml on Monday - Wednesday - Friday MVI IV today , change to po when possible due to national shortage Sliding scale insulin Labs per protocol  Raquel James, Rasheedah Reis Bennett 11/04/2011,11:18 AM

## 2011-11-04 NOTE — Progress Notes (Signed)
NAMEALIZZON, DIOGUARDI                ACCOUNT NO.:  [id number]  MEDICAL RECORD NO.:  [id number]  LOCATION:                                 FACILITY:  PHYSICIAN:  Kingsley Callander. Ouida Sills, MD       DATE OF BIRTH:  Nov 14, 1935  DATE OF PROCEDURE:  11/04/2011 DATE OF DISCHARGE:                                PROGRESS NOTE   Ms. Enwright had her nasogastric tube clamped yesterday evening, but had it reconnected about 10 last night after feeling some belching.  She required pain medication twice overnight. She has not vomited.  She has had an NG output of 950.  She denies passing any gas or stool.  She is hungry.  VITAL SIGNS:  She has had no fever.  His temperature is 98 with a pulse of 75, and blood pressure of 138/79. LUNGS:  Clear. HEART:  Regular with no murmurs. ABDOMEN:  Nontender.  Her wound appears to be healing well.  Minimal appropriate tenderness is present.  No appreciable bowel sounds.  IMPRESSION/PLAN:  Postop day 4, she again appears stable and comfortable.  Bowel function has not yet returned.  She has a persistent leukocytosis with a white count of 24.1, down from 28.7, hemoglobin is stable at 10.9.  She is mildly hypokalemic at 3.2, and will receive additional potassium supplementation today.  We will modify her IV fluids to normal saline with 40 mEq of potassium.  She had a urinalysis yesterday, which was negative.  Blood cultures were drawn.  Chest x-ray reveals lower lobe atelectasis versus infiltrates.  She is not coughing. She is walking well.     Kingsley Callander. Ouida Sills, MD     ROF/MEDQ  D:  11/04/2011  T:  11/04/2011  Job:  [id number]

## 2011-11-04 NOTE — Progress Notes (Signed)
UR chart review completed.  

## 2011-11-05 LAB — CBC
Hemoglobin: 10.7 g/dL — ABNORMAL LOW (ref 12.0–15.0)
Platelets: 494 10*3/uL — ABNORMAL HIGH (ref 150–400)
RBC: 3.43 MIL/uL — ABNORMAL LOW (ref 3.87–5.11)
WBC: 15.1 10*3/uL — ABNORMAL HIGH (ref 4.0–10.5)

## 2011-11-05 LAB — COMPREHENSIVE METABOLIC PANEL
ALT: 18 U/L (ref 0–35)
AST: 18 U/L (ref 0–37)
Alkaline Phosphatase: 78 U/L (ref 39–117)
CO2: 27 mEq/L (ref 19–32)
Chloride: 98 mEq/L (ref 96–112)
GFR calc non Af Amer: >90 mL/min (ref >90)
Potassium: 3.4 mEq/L — ABNORMAL LOW (ref 3.5–5.1)
Sodium: 134 mEq/L — ABNORMAL LOW (ref 135–145)
Total Bilirubin: 0.4 mg/dL (ref 0.3–1.2)

## 2011-11-05 LAB — DIFFERENTIAL
Basophils Relative: 0 % (ref 0–1)
Lymphocytes Relative: 12 % (ref 12–46)
Lymphs Abs: 1.8 10*3/uL (ref 0.7–4.0)
Monocytes Relative: 15 % — ABNORMAL HIGH (ref 3–12)
Neutro Abs: 10.8 10*3/uL — ABNORMAL HIGH (ref 1.7–7.7)
Neutrophils Relative %: 71 % (ref 43–77)

## 2011-11-05 LAB — GLUCOSE, CAPILLARY
Glucose-Capillary: 123 mg/dL — ABNORMAL HIGH (ref 70–99)
Glucose-Capillary: 132 mg/dL — ABNORMAL HIGH (ref 70–99)
Glucose-Capillary: 108 mg/dL — ABNORMAL HIGH (ref 70–99)

## 2011-11-05 LAB — PHOSPHORUS: Phosphorus: 2.2 mg/dL — ABNORMAL LOW (ref 2.3–4.6)

## 2011-11-05 MED ORDER — PANTOPRAZOLE SODIUM 40 MG PO TBEC
40.0000 mg | DELAYED_RELEASE_TABLET | Freq: Every day | ORAL | Status: DC
  Administered 2011-11-05 – 2011-11-08 (×4): 40 mg via ORAL
  Filled 2011-11-05 (×4): qty 1

## 2011-11-05 MED ORDER — POTASSIUM PHOSPHATE DIBASIC 3 MMOLE/ML IV SOLN
10.0000 mmol | Freq: Once | INTRAVENOUS | Status: AC
  Administered 2011-11-05: 10 mmol via INTRAVENOUS
  Filled 2011-11-05: qty 3.33

## 2011-11-05 MED ORDER — CLINIMIX E/DEXTROSE (5/15) 5 % IV SOLN
INTRAVENOUS | Status: AC
  Administered 2011-11-05: 18:00:00 via INTRAVENOUS
  Filled 2011-11-05: qty 2000

## 2011-11-05 MED ORDER — POTASSIUM CHLORIDE 10 MEQ/100ML IV SOLN
10.0000 meq | INTRAVENOUS | Status: AC
  Administered 2011-11-05 (×2): 10 meq via INTRAVENOUS
  Filled 2011-11-05 (×2): qty 100

## 2011-11-05 MED ORDER — MAGNESIUM HYDROXIDE 400 MG/5ML PO SUSP
15.0000 mL | Freq: Two times a day (BID) | ORAL | Status: DC
  Administered 2011-11-05 – 2011-11-06 (×3): 15 mL via ORAL
  Filled 2011-11-05 (×3): qty 30

## 2011-11-05 NOTE — Progress Notes (Signed)
5 Days Post-Op  Subjective: Has had multiple bowel movements this morning. Passing flatus. No complaints.  Objective: Vital signs in last 24 hours: Temp:  [98.5 F (36.9 C)-98.9 F (37.2 C)] 98.6 F (37 C) (08/10 0537) Pulse Rate:  [75-85] 75  (08/10 0826) Resp:  [18-21] 18  (08/10 0537) BP: (135-146)/(75-85) 142/85 mmHg (08/10 0537) SpO2:  [92 %-94 %] 93 % (08/10 0826) Last BM Date: 10/30/11  Intake/Output from previous day: 08/09 0701 - 08/10 0700 In: 2708 [I.V.:1611.7; IV Piggyback:610; TPN:486.3] Out: 3200 [Urine:2300; Emesis/NG output:900] Intake/Output this shift:    General appearance: alert, cooperative and no distress Resp: clear to auscultation bilaterally Cardio: regular rate and rhythm, S1, S2 normal, no murmur, click, rub or gallop GI: Soft. Incision is healing well. Good bowel sounds appreciated  Lab Results:   Basename 11/05/11 0459 11/04/11 0513  WBC 15.1* 24.1*  HGB 10.7* 10.9*  HCT 29.9* 30.9*  PLT 494* 493*   BMET  Basename 11/05/11 0459 11/04/11 0513  NA 134* 134*  K 3.4* 3.2*  CL 98 97  CO2 27 26  GLUCOSE 138* 81  BUN 7 7  CREATININE 0.48* 0.56  CALCIUM 8.3* 8.1*   PT/INR No results found for this basename: LABPROT:2,INR:2 in the last 72 hours  Studies/Results: Dg Chest 2 View  11/03/2011  *RADIOLOGY REPORT*  Clinical Data: Leukocytosis postoperatively  CHEST - 2 VIEW  Comparison: 11/08/2007  Findings: The cardiac shadow is stable.  Bibasilar atelectasis/infiltrate is identified. Mild pleural effusion is noted bilaterally.  A nasogastric catheter seen with proximal side port in the distal esophagus.  The tip lies within the stomach.  IMPRESSION: Bibasilar changes.  Given the patient's leukocytosis these are likely related to acute pneumonic infiltrate.  Original Report Authenticated By: Phillips Odor, M.D.   Dg Chest Port 1 View  11/04/2011  *RADIOLOGY REPORT*  Clinical Data: PICC line placement  PORTABLE CHEST - 1 VIEW  Comparison:  11/03/2011  Findings: Mild cardiomegaly.  Right PICC placed with its tip in the lower SVC.  NG tube unchanged.  Bibasilar atelectasis verses airspace disease worse.  Bilateral pleural effusions stable.  No pneumothorax.  Vascular congestion.  IMPRESSION: PICC placed with its tip in the lower SVC.  Original Report Authenticated By: Donavan Burnet, M.D.    Anti-infectives: Anti-infectives     Start     Dose/Rate Route Frequency Ordered Stop   11/02/11 0900   levofloxacin (LEVAQUIN) IVPB 500 mg        500 mg 100 mL/hr over 60 Minutes Intravenous Every 24 hours 11/02/11 0750     11/02/11 0900   metroNIDAZOLE (FLAGYL) IVPB 500 mg        500 mg 100 mL/hr over 60 Minutes Intravenous Every 6 hours 11/02/11 0750     10/31/11 1030   metroNIDAZOLE (FLAGYL) IVPB 500 mg  Status:  Discontinued        500 mg 100 mL/hr over 60 Minutes Intravenous  Once 10/31/11 1018 10/31/11 1419   10/31/11 1018   ciprofloxacin (CIPRO) IVPB 400 mg        400 mg 200 mL/hr over 60 Minutes Intravenous 120 min pre-op 10/31/11 1018 10/31/11 1138   10/31/11 0000   metroNIDAZOLE (FLAGYL) IVPB 500 mg  Status:  Discontinued        500 mg 100 mL/hr over 60 Minutes Intravenous  Once 10/30/11 1419 10/30/11 1423   10/30/11 1419   ciprofloxacin (CIPRO) IVPB 400 mg  Status:  Discontinued  400 mg 200 mL/hr over 60 Minutes Intravenous 120 min pre-op 10/30/11 1419 10/30/11 1422          Assessment/Plan: s/p Procedure(s): EXPLORATORY LAPAROTOMY SMALL BOWEL RESECTION Impression: Bowel function has returned, status post partial small bowel resection for bowel obstruction. Her leukocytosis has dropped significantly. Will DC NG tube and advance to full liquid diet. She is encouraged to continue ambulating in the hallway. Will decrease her IV fluids. Hypophosphatemia and hypokalemia are being addressed.  LOS: 10 days    Caidynce Muzyka A 11/05/2011

## 2011-11-05 NOTE — Progress Notes (Signed)
PARENTERAL NUTRITION CONSULT NOTE - INITIAL  Pharmacy Consult for TPN Indication: Prolonged Ileus - post operative Ileus  Allergies  Allergen Reactions  . Macrodantin (Nitrofurantoin Macrocrystal)   . Promethazine   . Sulfa Antibiotics   . Suprax (Cefixime)     Patient Measurements: Height: 5\' 2"  (157.5 cm) Weight: 135 lb (61.236 kg) IBW/kg (Calculated) : 50.1    Vital Signs: Temp: 98.6 F (37 C) (08/10 0537) Temp src: Oral (08/10 0537) BP: 142/85 mmHg (08/10 0537) Pulse Rate: 75  (08/10 0826) Intake/Output from previous day: 08/09 0701 - 08/10 0700 In: 2708 [I.V.:1611.7; IV Piggyback:610; TPN:486.3] Out: 3200 [Urine:2300; Emesis/NG output:900] Intake/Output from this shift:    Labs:  [facility] 11/05/11 0459 11/04/11 0513 11/03/11 0453  WBC 15.1* 24.1* 28.7*  HGB 10.7* 10.9* 10.5*  HCT 29.9* 30.9* 29.9*  PLT 494* 493* 459*  APTT -- -- --  INR -- -- --     Basename 11/05/11 0459 11/04/11 0513 11/03/11 0453  NA 134* 134* 134*  K 3.4* 3.2* 3.5  CL 98 97 96  CO2 27 26 26   GLUCOSE 138* 81 74  BUN 7 7 9   CREATININE 0.48* 0.56 0.62  LABCREA -- -- --  CREAT24HRUR -- -- --  CALCIUM 8.3* 8.1* 8.3*  MG 1.7 -- --  PHOS 2.2* -- --  PROT 5.2* -- --  ALBUMIN 2.0* -- --  AST 18 -- --  ALT 18 -- --  ALKPHOS 78 -- --  BILITOT 0.4 -- --  BILIDIR -- -- --  IBILI -- -- --  PREALBUMIN -- -- --  TRIG 139 -- --  CHOLHDL -- -- --  CHOL 114 -- --   Estimated Creatinine Clearance: 53.1 ml/min (by C-G formula based on Cr of 0.48).    Basename 11/04/11 2052  GLUCAP 96    Medical History: Past Medical History  Diagnosis Date  . GERD (gastroesophageal reflux disease)   . Hyperlipidemia   . Anxiety   . Arthritis   . Osteoporosis     Medications:  Scheduled:     . enoxaparin  40 mg Subcutaneous Q24H  . insulin aspart  0-9 Units Subcutaneous Q6H  . levofloxacin (LEVAQUIN) IV  500 mg Intravenous Q24H  . metronidazole  500 mg Intravenous Q6H  . pantoprazole  (PROTONIX) IV  40 mg Intravenous QHS  . potassium chloride  10 mEq Intravenous Q1 Hr x 4  . potassium chloride  10 mEq Intravenous Once  . potassium chloride      . potassium phosphate IVPB (mmol)  10 mmol Intravenous Once  . sodium chloride  10-40 mL Intracatheter Q12H    Insulin Requirements in the past 24 hours:  1 unit  Current Nutrition:  NPO  Assessment: Potassium 3.4 Phosphorus 2.2 Glucose 138 Sodium 134  Nutritional Goals:  1500 - 1800 kCal per day  80 - 90 grams of protein per day 1 ml/kcal  Plan:  Potassium Phosphate 10 mM today for low phos. (includes K 14.5 mEq) Potassium Chloride 10 mEq x 2 runs for addition K 20 mEq Increase Clinimix 5/15 E to 45 ml/hr (goal rate 60 ml/hr) Lipids 20% 250 ml on Monday - Wednesday - Friday MVI / Trace Elements to Monday TPN Sliding scale insulin, continue to monitor insulin usage Labs per protocol  Raquel James, Aydeen Blume Bennett 11/05/2011,8:34 AM

## 2011-11-06 LAB — CBC
Hemoglobin: 10.6 g/dL — ABNORMAL LOW (ref 12.0–15.0)
MCHC: 35 g/dL (ref 30.0–36.0)

## 2011-11-06 LAB — BASIC METABOLIC PANEL
GFR calc Af Amer: >90 mL/min (ref >90)
GFR calc non Af Amer: >90 mL/min (ref >90)
Glucose, Bld: 143 mg/dL — ABNORMAL HIGH (ref 70–99)
Potassium: 3.4 mEq/L — ABNORMAL LOW (ref 3.5–5.1)
Sodium: 133 mEq/L — ABNORMAL LOW (ref 135–145)

## 2011-11-06 LAB — GLUCOSE, CAPILLARY
Glucose-Capillary: 149 mg/dL — ABNORMAL HIGH (ref 70–99)
Glucose-Capillary: 117 mg/dL — ABNORMAL HIGH (ref 70–99)

## 2011-11-06 MED ORDER — CLINIMIX E/DEXTROSE (5/15) 5 % IV SOLN
INTRAVENOUS | Status: AC
  Administered 2011-11-06 – 2011-11-07 (×2): via INTRAVENOUS
  Filled 2011-11-06: qty 2000

## 2011-11-06 MED ORDER — POTASSIUM CHLORIDE 10 MEQ/100ML IV SOLN
10.0000 meq | INTRAVENOUS | Status: AC
  Administered 2011-11-06 (×3): 10 meq via INTRAVENOUS
  Filled 2011-11-06 (×3): qty 100

## 2011-11-06 MED ORDER — MAGNESIUM HYDROXIDE 400 MG/5ML PO SUSP
15.0000 mL | Freq: Two times a day (BID) | ORAL | Status: DC
  Administered 2011-11-06 – 2011-11-08 (×5): 15 mL via ORAL
  Filled 2011-11-06 (×5): qty 30

## 2011-11-06 MED ORDER — ENSURE PUDDING PO PUDG
1.0000 | Freq: Three times a day (TID) | ORAL | Status: DC
  Administered 2011-11-06 – 2011-11-07 (×3): 1 via ORAL

## 2011-11-06 MED ORDER — MAGNESIUM HYDROXIDE 400 MG/5ML PO SUSP: 30.0000 mL | Freq: Two times a day (BID) | ORAL | Status: DC

## 2011-11-06 NOTE — Progress Notes (Signed)
She is comfortable this morning. She has had another bowel movement. She denies any nausea or vomiting or abdominal pain now. She has been tolerating liquids well. She has been walking in the hall.  Vital signs are normal. Lungs are clear. Heart is regular with no murmurs. Abdomen is soft and appropriately tender. Her wound is healing well. No signs of infection are present.  Impression/plan 1. Small bowel obstruction. Postoperative day #6. She is improving nicely at this point since her bowel function is improving. Her case has been discussed with Dr. Lovell Sheehan. Her diet will be advanced to a soft diet today. Her nasogastric tube was removed yesterday. Her TPN will be tapered over time. She remains mildly hypokalemic at 3.4 and will receive additional potassium supplementation today.

## 2011-11-06 NOTE — Progress Notes (Signed)
6 Days Post-Op  Subjective: Is tolerating full liquid diet well. Has had bowel movements. His voiding without difficulty.  Objective: Vital signs in last 24 hours: Temp:  [98.2 F (36.8 C)-98.5 F (36.9 C)] 98.2 F (36.8 C) (08/11 0615) Pulse Rate:  [70-79] 75  (08/11 0615) Resp:  [16-18] 18  (08/11 0615) BP: (116-125)/(74) 116/74 mmHg (08/11 0615) SpO2:  [91 %-96 %] 96 % (08/11 0748) Last BM Date: 11/06/11  Intake/Output from previous day: 08/10 0701 - 08/11 0700 In: 2424.7 [I.V.:757.2; IV Piggyback:600; TPN:1067.5] Out: -  Intake/Output this shift:    General appearance: alert, cooperative and no distress GI: Soft, nontender, nondistended. Incision healing well. Bowel sounds appreciated.  Lab Results:   Basename 11/06/11 0459 11/05/11 0459  WBC 15.6* 15.1*  HGB 10.6* 10.7*  HCT 30.3* 29.9*  PLT 442* 494*   BMET  Basename 11/06/11 0459 11/05/11 0459  NA 133* 134*  K 3.4* 3.4*  CL 100 98  CO2 29 27  GLUCOSE 143* 138*  BUN 8 7  CREATININE 0.50 0.48*  CALCIUM 8.1* 8.3*   PT/INR No results found for this basename: LABPROT:2,INR:2 in the last 72 hours  Studies/Results: Dg Chest Port 1 View  11/04/2011  *RADIOLOGY REPORT*  Clinical Data: PICC line placement  PORTABLE CHEST - 1 VIEW  Comparison: 11/03/2011  Findings: Mild cardiomegaly.  Right PICC placed with its tip in the lower SVC.  NG tube unchanged.  Bibasilar atelectasis verses airspace disease worse.  Bilateral pleural effusions stable.  No pneumothorax.  Vascular congestion.  IMPRESSION: PICC placed with its tip in the lower SVC.  Original Report Authenticated By: Donavan Burnet, M.D.    Anti-infectives: Anti-infectives     Start     Dose/Rate Route Frequency Ordered Stop   11/02/11 0900   levofloxacin (LEVAQUIN) IVPB 500 mg        500 mg 100 mL/hr over 60 Minutes Intravenous Every 24 hours 11/02/11 0750     11/02/11 0900   metroNIDAZOLE (FLAGYL) IVPB 500 mg        500 mg 100 mL/hr over 60 Minutes  Intravenous Every 6 hours 11/02/11 0750     10/31/11 1030   metroNIDAZOLE (FLAGYL) IVPB 500 mg  Status:  Discontinued        500 mg 100 mL/hr over 60 Minutes Intravenous  Once 10/31/11 1018 10/31/11 1419   10/31/11 1018   ciprofloxacin (CIPRO) IVPB 400 mg        400 mg 200 mL/hr over 60 Minutes Intravenous 120 min pre-op 10/31/11 1018 10/31/11 1138   10/31/11 0000   metroNIDAZOLE (FLAGYL) IVPB 500 mg  Status:  Discontinued        500 mg 100 mL/hr over 60 Minutes Intravenous  Once 10/30/11 1419 10/30/11 1423   10/30/11 1419   ciprofloxacin (CIPRO) IVPB 400 mg  Status:  Discontinued        400 mg 200 mL/hr over 60 Minutes Intravenous 120 min pre-op 10/30/11 1419 10/30/11 1422          Assessment/Plan: s/p Procedure(s): EXPLORATORY LAPAROTOMY SMALL BOWEL RESECTION Impression: Status post partial small bowel resection, bowel function returning. Patient progressing well. We'll advance to soft diet. Will increase milk of magnesia per patient request as she has been on this chronically. Will supplement potassium.  LOS: 11 days    Vale Mousseau A 11/06/2011

## 2011-11-06 NOTE — Progress Notes (Signed)
PARENTERAL NUTRITION CONSULT NOTE   Pharmacy Consult for TPN Indication: Prolonged Ileus - post operative Ileus  Allergies  Allergen Reactions  . Macrodantin (Nitrofurantoin Macrocrystal)   . Promethazine   . Sulfa Antibiotics   . Suprax (Cefixime)     Patient Measurements: Height: 5\' 2"  (157.5 cm) Weight: 135 lb (61.236 kg) IBW/kg (Calculated) : 50.1    Vital Signs: Temp: 98.2 F (36.8 C) (08/11 0615) Temp src: Oral (08/11 0615) BP: 116/74 mmHg (08/11 0615) Pulse Rate: 75  (08/11 0615) Intake/Output from previous day: 08/10 0701 - 08/11 0700 In: 2424.7 [I.V.:757.2; IV Piggyback:600; TPN:1067.5] Out: -  Intake/Output from this shift:    Labs:  [facility] 11/06/11 0459 11/05/11 0459 11/04/11 0513  WBC 15.6* 15.1* 24.1*  HGB 10.6* 10.7* 10.9*  HCT 30.3* 29.9* 30.9*  PLT 442* 494* 493*  APTT -- -- --  INR -- -- --     Basename 11/06/11 0459 11/05/11 0459 11/04/11 0513  NA 133* 134* 134*  K 3.4* 3.4* 3.2*  CL 100 98 97  CO2 29 27 26   GLUCOSE 143* 138* 81  BUN 8 7 7   CREATININE 0.50 0.48* 0.56  LABCREA -- -- --  CREAT24HRUR -- -- --  CALCIUM 8.1* 8.3* 8.1*  MG -- 1.7 --  PHOS 2.9 2.2* --  PROT -- 5.2* --  ALBUMIN -- 2.0* --  AST -- 18 --  ALT -- 18 --  ALKPHOS -- 78 --  BILITOT -- 0.4 --  BILIDIR -- -- --  IBILI -- -- --  PREALBUMIN -- 7.0* --  TRIG -- 139 --  CHOLHDL -- -- --  CHOL -- 114 --   Estimated Creatinine Clearance: 53.1 ml/min (by C-G formula based on Cr of 0.5).    Basename 11/06/11 0614 11/05/11 2345 11/05/11 1701  GLUCAP 121* 149* 108*    Medical History: Past Medical History  Diagnosis Date  . GERD (gastroesophageal reflux disease)   . Hyperlipidemia   . Anxiety   . Arthritis   . Osteoporosis     Medications:  Scheduled:     . enoxaparin  40 mg Subcutaneous Q24H  . feeding supplement  1 Container Oral TID BM  . insulin aspart  0-9 Units Subcutaneous Q6H  . levofloxacin (LEVAQUIN) IV  500 mg Intravenous Q24H  .  magnesium hydroxide  30 mL Oral BID  . metronidazole  500 mg Intravenous Q6H  . pantoprazole  40 mg Oral Q1200  . potassium chloride  10 mEq Intravenous Q1 Hr x 2  . potassium chloride  10 mEq Intravenous Q1 Hr x 3  . potassium phosphate IVPB (mmol)  10 mmol Intravenous Once  . sodium chloride  10-40 mL Intracatheter Q12H  . DISCONTD: magnesium hydroxide  15 mL Oral BID    Insulin Requirements in the past 24 hours:  2 unit  Current Nutrition:  Advanced to soft diet  Assessment: Tolerated full liquid diet, advanced to soft diet Expect TPN discontinuation as diet tolerated Potassium 3.4 Low Glucose 143 slightly higher Sodium 133 slightly low  Nutritional Goals:  1500 - 1800 kCal per day  80 - 90 grams of protein per day 1 ml/kcal  Plan:  Increase Clinimix 5/15 E to 60 ml/hr (goal rate 60 ml/hr) Possible discontinuation as diet advances and tolerated Lipids 20% 250 ml on Monday - Wednesday - Friday MD ordered replacement K runs today MVI / Trace Elements to Monday TPN Sliding scale insulin, continue to monitor insulin usage, none added to TPN presently  Labs per protocol  Raquel James, Greg Eckrich Bennett 11/06/2011,10:18 AM

## 2011-11-06 NOTE — Progress Notes (Signed)
Patient had a large liquid BM around lunch time.  Patient requested MD be notified and asked if MOM could be decreased to 15cc BID.  MD notified.  New orders received and carried out.  Schonewitz, Candelaria Stagers 11/06/2011

## 2011-11-07 ENCOUNTER — Encounter (HOSPITAL_COMMUNITY): Payer: Self-pay | Admitting: General Surgery

## 2011-11-07 LAB — COMPREHENSIVE METABOLIC PANEL
Albumin: 2.2 g/dL — ABNORMAL LOW (ref 3.5–5.2)
Alkaline Phosphatase: 78 U/L (ref 39–117)
BUN: 9 mg/dL (ref 6–23)
Creatinine, Ser: 0.51 mg/dL (ref 0.50–1.10)
GFR calc Af Amer: >90 mL/min (ref >90)
Glucose, Bld: 154 mg/dL — ABNORMAL HIGH (ref 70–99)
Potassium: 4.3 mEq/L (ref 3.5–5.1)
Total Bilirubin: 0.4 mg/dL (ref 0.3–1.2)
Total Protein: 5.2 g/dL — ABNORMAL LOW (ref 6.0–8.3)

## 2011-11-07 LAB — GLUCOSE, CAPILLARY
Glucose-Capillary: 121 mg/dL — ABNORMAL HIGH (ref 70–99)
Glucose-Capillary: 136 mg/dL — ABNORMAL HIGH (ref 70–99)

## 2011-11-07 LAB — DIFFERENTIAL
Basophils Absolute: 0.1 10*3/uL (ref 0.0–0.1)
Basophils Relative: 0 % (ref 0–1)
Eosinophils Relative: 1 % (ref 0–5)
Lymphocytes Relative: 14 % (ref 12–46)
Monocytes Absolute: 2.1 10*3/uL — ABNORMAL HIGH (ref 0.1–1.0)
Neutro Abs: 12.5 10*3/uL — ABNORMAL HIGH (ref 1.7–7.7)

## 2011-11-07 LAB — CBC
HCT: 30.6 % — ABNORMAL LOW (ref 36.0–46.0)
Hemoglobin: 10.7 g/dL — ABNORMAL LOW (ref 12.0–15.0)
MCHC: 35 g/dL (ref 30.0–36.0)
MCV: 89 fL (ref 78.0–100.0)
RDW: 14 % (ref 11.5–15.5)

## 2011-11-07 LAB — MAGNESIUM: Magnesium: 2 mg/dL (ref 1.5–2.5)

## 2011-11-07 LAB — PHOSPHORUS: Phosphorus: 3 mg/dL (ref 2.3–4.6)

## 2011-11-07 LAB — CHOLESTEROL, TOTAL: Cholesterol: 77 mg/dL (ref 0–200)

## 2011-11-07 MED ORDER — ENSURE COMPLETE PO LIQD
237.0000 mL | Freq: Two times a day (BID) | ORAL | Status: DC
  Administered 2011-11-08: 237 mL via ORAL

## 2011-11-07 MED ORDER — CLINIMIX E/DEXTROSE (5/15) 5 % IV SOLN
INTRAVENOUS | Status: DC
  Filled 2011-11-07: qty 2000

## 2011-11-07 NOTE — Progress Notes (Signed)
Nutrition Follow-up  Intervention:  Recommend d/c Ensure Pudding, change to Ensure Complete BID provides 350 kcal, 13 gr protein each 237 ml.  Assessment:   Pt tolerating advancement of diet with po intake slowly increasing. TPN being weaned (currently Clinimix 5/15 @30ml /hr) with plans to d/c tomorrow. She complains today that she doesn't like the taste of Ensure Pudding.  Will monitor oral intake meals and supplement. May need to also add protein modular.  Diet Order: Dysphagia 3 with thin liquids po intakes 0-60%    Meds: Scheduled Meds:   . enoxaparin  40 mg Subcutaneous Q24H  . feeding supplement  237 mL Oral BID BM  . feeding supplement  1 Container Oral TID BM  . insulin aspart  0-9 Units Subcutaneous Q6H  . levofloxacin (LEVAQUIN) IV  500 mg Intravenous Q24H  . magnesium hydroxide  15 mL Oral BID  . metronidazole  500 mg Intravenous Q6H  . pantoprazole  40 mg Oral Q1200  . sodium chloride  10-40 mL Intracatheter Q12H   Continuous Infusions:   . 0.9 % NaCl with KCl 40 mEq / L 20 mL/hr (11/07/11 0240)  . TPN (CLINIMIX) +/- additives 45 mL/hr at 11/05/11 1758  . TPN (CLINIMIX) +/- additives 30 mL/hr at 11/07/11 1414  . TPN (CLINIMIX) +/- additives     PRN Meds:.diphenhydrAMINE, diphenhydrAMINE, HYDROmorphone (DILAUDID) injection, naloxone, ondansetron, ondansetron (ZOFRAN) IV, phenol, sodium chloride, sodium chloride  Labs:  CMP     Component Value Date/Time   NA 131* 11/07/2011 0454   K 4.3 11/07/2011 0454   CL 97 11/07/2011 0454   CO2 28 11/07/2011 0454   GLUCOSE 154* 11/07/2011 0454   BUN 9 11/07/2011 0454   CREATININE 0.51 11/07/2011 0454   CALCIUM 8.2* 11/07/2011 0454   PROT 5.2* 11/07/2011 0454   ALBUMIN 2.2* 11/07/2011 0454   AST 26 11/07/2011 0454   ALT 17 11/07/2011 0454   ALKPHOS 78 11/07/2011 0454   BILITOT 0.4 11/07/2011 0454   GFRNONAA >90 11/07/2011 0454   GFRAA >90 11/07/2011 0454     Intake/Output Summary (Last 24 hours) at 11/07/11 1625 Last data filed at  11/07/11 0800  Gross per 24 hour  Intake 2555.08 ml  Output   2450 ml  Net 105.08 ml    Weight Status: 135# 10/26/11  Re-estimated needs:  1500-1800 kcal  80-90 gr/day  1 ml/kcal  Nutrition Dx: Inadequate oral intake continues r/t altered GI function AEB  S/p small bowel resection  Goal: Pt to meet >/= 90% of their estimated nutrition needs; not met   Monitor: pt progression/transition from TPN to oral nutrition and assess adequacy   Royann Shivers MS,RD,LDN,CSG Office: #[phone] Pager: [phone]

## 2011-11-07 NOTE — Progress Notes (Signed)
7 Days Post-Op  Subjective: Tolerating soft diet.  + BM.  No pain reported.  No nausea.  Objective: Vital signs in last 24 hours: Temp:  [98.5 F (36.9 Alexander)-100 F (37.8 Alexander)] 98.6 F (37 Alexander) (08/12 0646) Pulse Rate:  [81-85] 81  (08/12 0646) Resp:  [17-18] 17  (08/12 0646) BP: (111-126)/(68-78) 126/68 mmHg (08/12 0646) SpO2:  [94 %-96 %] 96 % (08/12 0646) Last BM Date: 11/06/11  Intake/Output from previous day: 08/11 0701 - 08/12 0700 In: 2315.1 [P.O.:360; I.V.:280.3; IV Piggyback:400; TPN:1274.8] Out: 2550 [Urine:2550] Intake/Output this shift: Total I/O In: 360 [P.O.:360] Out: -   General appearance: alert and no distress GI: Positive bowel sounds, soft, mild expected postoperative tenderness. Incision is clean dry and intact. Staples present. No erythema. No peritoneal signs.  Lab Results:   Basename 11/07/11 0454 11/06/11 0459  WBC 17.2* 15.6*  HGB 10.7* 10.6*  HCT 30.6* 30.3*  PLT 451* 442*   BMET  Basename 11/07/11 0454 11/06/11 0459  NA 131* 133*  K 4.3 3.4*  CL 97 100  CO2 28 29  GLUCOSE 154* 143*  BUN 9 8  CREATININE 0.51 0.50  CALCIUM 8.2* 8.1*   PT/INR No results found for this basename: LABPROT:2,INR:2 in the last 72 hours ABG No results found for this basename: PHART:2,PCO2:2,PO2:2,HCO3:2 in the last 72 hours  Studies/Results: No results found.  Anti-infectives: Anti-infectives     Start     Dose/Rate Route Frequency Ordered Stop   11/02/11 0900   levofloxacin (LEVAQUIN) IVPB 500 mg        500 mg 100 mL/hr over 60 Minutes Intravenous Every 24 hours 11/02/11 0750     11/02/11 0900   metroNIDAZOLE (FLAGYL) IVPB 500 mg        500 mg 100 mL/hr over 60 Minutes Intravenous Every 6 hours 11/02/11 0750     10/31/11 1030   metroNIDAZOLE (FLAGYL) IVPB 500 mg  Status:  Discontinued        500 mg 100 mL/hr over 60 Minutes Intravenous  Once 10/31/11 1018 10/31/11 1419   10/31/11 1018   ciprofloxacin (CIPRO) IVPB 400 mg        400 mg 200 mL/hr over  60 Minutes Intravenous 120 min pre-op 10/31/11 1018 10/31/11 1138   10/31/11 0000   metroNIDAZOLE (FLAGYL) IVPB 500 mg  Status:  Discontinued        500 mg 100 mL/hr over 60 Minutes Intravenous  Once 10/30/11 1419 10/30/11 1423   10/30/11 1419   ciprofloxacin (CIPRO) IVPB 400 mg  Status:  Discontinued        400 mg 200 mL/hr over 60 Minutes Intravenous 120 min pre-op 10/30/11 1419 10/30/11 1422          Assessment/Plan: s/p Procedure(s) (LRB): EXPLORATORY LAPAROTOMY (N/A) SMALL BOWEL RESECTION () Continue to advance diet as tolerated. Continue to increase activity. Continues to have a low leukocytosis likely still related to her pneumonia. Continue antibiotics for coverage of her suspected pneumonia. At this point we'll begin to decrease TPN. We'll cut TPN rate to half with plans to likely discontinue TPN tomorrow. If continues to tolerated diet and continues to demonstrate response to antibiotics will likely switch patient to an oral antibiotic tomorrow for continued outpatient treatment for pneumonia with hopeful plans for discharge in the next 24-48 hours.  LOS: 12 days    Caitlin Alexander 11/07/2011

## 2011-11-07 NOTE — Progress Notes (Signed)
Caitlin Alexander, Caitlin Alexander                ACCOUNT NO.:  [id number]  MEDICAL RECORD NO.:  [id number]  LOCATION:                                 FACILITY:  PHYSICIAN:  Kingsley Callander. Ouida Sills, MD       DATE OF BIRTH:  31-Jan-1936  DATE OF PROCEDURE:  11/05/2011 DATE OF DISCHARGE:                                PROGRESS NOTE   Ms. Stowell has had a bowel movement.  She is hungry.  She denies any nausea or vomiting.  She has remained on nasogastric suction.  She has not experienced abdominal pain, now.  VITAL SIGNS:  She has had no fever.  Temperature is 98.6 with a pulse of 75, blood pressure of 142/85. LUNGS:  Clear. HEART:  Regular with no murmurs. ABDOMEN:  Appropriately tender with no distention or signs of wound infection.  Bowel sounds are present. LOWER EXTREMITIES:  Trace edema.  IMPRESSION/PLAN: 1. Postop day 5, it appears her ileus is improving.  Hopefully, her     nasogastric tube can be clamped and removed in the near future.     The decision regarding these modifications will be left to surgery.     Her white count has improved to 15.1 with a hemoglobin of 10.7,     which is stable. 2. Hypokalemia has modestly improved from 3.2 to 3.4.  We will     continue potassium supplementation. 3. Nutrition.  A PICC line has been placed in the right upper     extremity.  TPN has been started.  Her albumin is 2.0, phosphorus     is 2.2, magnesium is 1.7, and calcium is 8.3.  She remains on IV     antibiotics for possible pneumonia even though her symptoms of     pneumonia are really not prominent. Blood cultures are thus far     negative.     Kingsley Callander. Ouida Sills, MD     ROF/MEDQ  D:  11/05/2011  T:  11/05/2011  Job:  [id number]

## 2011-11-08 ENCOUNTER — Inpatient Hospital Stay (HOSPITAL_COMMUNITY): Payer: Medicare Other

## 2011-11-08 LAB — GLUCOSE, CAPILLARY
Glucose-Capillary: 136 mg/dL — ABNORMAL HIGH (ref 70–99)
Glucose-Capillary: 151 mg/dL — ABNORMAL HIGH (ref 70–99)
Glucose-Capillary: 157 mg/dL — ABNORMAL HIGH (ref 70–99)

## 2011-11-08 LAB — BASIC METABOLIC PANEL
BUN: 9 mg/dL (ref 6–23)
CO2: 24 mEq/L (ref 19–32)
Chloride: 91 mEq/L — ABNORMAL LOW (ref 96–112)
GFR calc non Af Amer: 90 mL/min — ABNORMAL LOW (ref >90)
Glucose, Bld: 501 mg/dL — ABNORMAL HIGH (ref 70–99)
Potassium: 5.1 mEq/L (ref 3.5–5.1)
Sodium: 123 mEq/L — ABNORMAL LOW (ref 135–145)

## 2011-11-08 LAB — CBC
HCT: 31.6 % — ABNORMAL LOW (ref 36.0–46.0)
Hemoglobin: 10.4 g/dL — ABNORMAL LOW (ref 12.0–15.0)
RBC: 3.35 MIL/uL — ABNORMAL LOW (ref 3.87–5.11)
WBC: 26.6 10*3/uL — ABNORMAL HIGH (ref 4.0–10.5)

## 2011-11-08 LAB — CULTURE, BLOOD (ROUTINE X 2): Culture: NO GROWTH

## 2011-11-08 MED ORDER — SODIUM CHLORIDE 0.9 % IJ SOLN
INTRAMUSCULAR | Status: AC
  Administered 2011-11-09: 10 mL
  Filled 2011-11-08: qty 3

## 2011-11-08 MED ORDER — IOHEXOL 300 MG/ML  SOLN
100.0000 mL | Freq: Once | INTRAMUSCULAR | Status: AC | PRN
  Administered 2011-11-08: 100 mL via INTRAVENOUS

## 2011-11-08 NOTE — Progress Notes (Signed)
NAMEARTISHA, Caitlin Alexander                ACCOUNT NO.:  [id number]  MEDICAL RECORD NO.:  [id number]  LOCATION:                                 FACILITY:  PHYSICIAN:  Kingsley Callander. Ouida Sills, MD       DATE OF BIRTH:  [birth year]  DATE OF PROCEDURE:  11/07/2011 DATE OF DISCHARGE:                                PROGRESS NOTE   GENERAL:  Ms. Sandell was alert comfortable and oriented.  Bowels have been moving. VITAL SIGNS:  She has had a slight temperature elevation to 100.  Vital signs have otherwise been normal.  She denies nausea. LUNGS:  Clear. HEART:  Regular with no murmurs. Abdomen:  Soft and nondistended.  Her wound is healing well.  IMPRESSION/PLAN:  Status post laparotomy for small bowel obstruction. White count is slightly up to 17.2 from 15.6, hemoglobin is stable at 10.7.  Sodium is slightly low at 131, but potassium has normalized from 3.4 to 4.3, calcium is 8.2.  She remains on TPN but after discussion with Dr. Lovell Sheehan, plan is to taper her TPN. She remains on Levaquin and Flagyl.     Kingsley Callander. Ouida Sills, MD     ROF/MEDQ  D:  11/08/2011  T:  11/08/2011  Job:  [id number]

## 2011-11-08 NOTE — Progress Notes (Signed)
NAME:  Caitlin Alexander, Caitlin Alexander                     ACCOUNT NO.:  MEDICAL RECORD NO.:  LOCATION:                                 FACILITY:  PHYSICIAN:  Kingsley Callander. Ouida Sills, MD            DATE OF BIRTH:  DATE OF PROCEDURE:  11/08/2011 DATE OF DISCHARGE:                                PROGRESS NOTE   [name] states that she feels lack of appetite now and feels some increased abdominal discomfort.  She had another bowel movement last night.  She denies any vomiting.  VITAL SIGNS:  She has been afebrile with a maximum temperature of 99.3 over the past 24 hours.  Temperature is 98.2 with a pulse of 85, and blood pressure of 102/65. LUNGS:  Clear. HEART:  Regular with no murmurs. ABDOMEN:  Soft with no significant tenderness.  Her wound appears to be healing well with no signs of infection.  IMPRESSION/PLAN:  Small bowel obstruction, status post laparotomy. White count remains elevated and is now up from 17.2 to 26.6.  She remains on Levaquin and Flagyl.  She again has no fever.  She has no symptoms of pneumonia.  TPN is being tapered.  Her electrolytes are significantly altered today, possibly related to her blood draw from her PICC line.  Sodium is dropped from 131 to 123 and her glucose was measured at 501, although her Accu-Chek is 151.  We will discuss further with Dr. Leticia Penna.  She remains anxious to go home.     Kingsley Callander. Ouida Sills, MD     ROF/MEDQ  D:  11/08/2011  T:  11/08/2011  Job:  [id number]

## 2011-11-09 LAB — BASIC METABOLIC PANEL
Chloride: 95 mEq/L — ABNORMAL LOW (ref 96–112)
Creatinine, Ser: 0.54 mg/dL (ref 0.50–1.10)
GFR calc Af Amer: >90 mL/min (ref >90)
GFR calc non Af Amer: >90 mL/min (ref >90)
Potassium: 4.1 mEq/L (ref 3.5–5.1)

## 2011-11-09 LAB — CBC
MCHC: 35 g/dL (ref 30.0–36.0)
MCV: 88.8 fL (ref 78.0–100.0)
Platelets: 472 10*3/uL — ABNORMAL HIGH (ref 150–400)
RDW: 14.2 % (ref 11.5–15.5)
WBC: 29.4 10*3/uL — ABNORMAL HIGH (ref 4.0–10.5)

## 2011-11-09 LAB — GLUCOSE, CAPILLARY: Glucose-Capillary: 153 mg/dL — ABNORMAL HIGH (ref 70–99)

## 2011-11-09 MED ORDER — INSULIN ASPART 100 UNIT/ML ~~LOC~~ SOLN: 0.0000 [IU] | Freq: Three times a day (TID) | SUBCUTANEOUS | Status: DC

## 2011-11-09 NOTE — Progress Notes (Signed)
Pt. Requesting to be transferred to [facility], Dr. Leticia Penna notified.

## 2011-11-09 NOTE — Progress Notes (Signed)
Pt. Transferred to [facility] via [facility].Picc Line intact, no problems voiced per pt.

## 2011-11-09 NOTE — Progress Notes (Signed)
Report called to Noreene Larsson RN at [facility] called to come and transfer pt.

## 2011-11-09 NOTE — Progress Notes (Signed)
POD 8  Subjective: Patient denies any nausea. Tolerating soft diet. Denies any fever or chills. She is having bowel function. She is passing flatus. She has mild to moderate abdominal tenderness which is well-controlled with pain medication at this time.  Objective: Vital signs in last 24 hours: Temp:  [98.6 F (37 C)-100.7 F (38.2 C)] 100.3 F (37.9 C) (08/14 0508) Pulse Rate:  [89-104] 104  (08/14 0810) Resp:  [16-18] 16  (08/14 0451) BP: (109-124)/(69-79) 120/69 mmHg (08/14 0451) SpO2:  [92 %-96 %] 93 % (08/14 0810) Last BM Date: 11/08/11  Intake/Output from previous day: 08/13 0701 - 08/14 0700 In: 870 [P.O.:60; I.V.:240; IV Piggyback:300; TPN:270] Out: -  Intake/Output this shift:    General appearance: alert and no distress Resp: clear to auscultation bilaterally Cardio: regular rate and rhythm GI: Positive bowel sounds, soft, mild to moderate midline tenderness. Incision is clean dry and intact. Staples are present. No diffuse peritoneal signs.  Lab Results:   [facility] 11/09/11 0452 11/08/11 0436  WBC 29.4* 26.6*  HGB 10.5* 10.4*  HCT 30.0* 31.6*  PLT 472* 452*   BMET  Basename 11/09/11 0452 11/08/11 0436  NA 128* 123*  K 4.1 5.1  CL 95* 91*  CO2 26 24  GLUCOSE 106* 501*  BUN 6 9  CREATININE 0.54 0.56  CALCIUM 8.2* 7.9*   PT/INR No results found for this basename: LABPROT:2,INR:2 in the last 72 hours ABG No results found for this basename: PHART:2,PCO2:2,PO2:2,HCO3:2 in the last 72 hours  Studies/Results: Ct Abdomen Pelvis W Contrast  11/08/2011  **ADDENDUM** CREATED: 11/08/2011 16:38:37  These results were called by telephone on 11/08/2011 at 1635 hours to Dr Leticia Penna, who verbally acknowledged these results.  **END ADDENDUM** SIGNED BY: Charline Bills, M.D.   11/08/2011  *RADIOLOGY REPORT*  Clinical Data: Persistent postoperative leukocytosis, status post bowel resection, history of appendectomy and hysterectomy  CT ABDOMEN AND PELVIS WITH CONTRAST   Technique:  Multidetector CT imaging of the abdomen and pelvis was performed following the standard protocol during bolus administration of intravenous contrast.  Contrast: OMNIPAQUE IOHEXOL 300 MG/ML  SOLN  Comparison: CT abdomen dated 10/26/2011  Findings: Patchy right lower lobe opacity, atelectasis versus pneumonia.  Suspected left basilar atelectasis.  10 mm probable cyst in the right hepatic dome (series 2/image 8).  Status post splenectomy and distal pancreatectomy.  Adrenal glands within normal limits.  Gallbladder is underdistended.  No intrahepatic or extrahepatic ductal dilatation.  Kidneys are within normal limits.  No hydronephrosis.  Status post small bowel resection with surgical anastomosis in the left mid abdomen (series 2/image 48).  Suspected anastomotic breakdown (series 2/image 43) with associated gas and fluid collection in the anterior abdomen measuring at least 3.3 x 11.7 cm. Additional 2.2 x 5.1 cm gas and fluid collection just posterior to the anastomoses (series 2/image 52).  Associated mesenteric fluid/stranding.  Multiple mildly prominent loops of small and large bowel, likely reflecting secondary adynamic ileus.  Atherosclerotic calcifications of the abdominal aorta and branch vessels.  No suspicious abdominopelvic lymphadenopathy.  Status post hysterectomy.  No adnexal masses.  Bladder is within normal limits.  Postsurgical changes in the anterior abdominal wall.  Body wall edema.  Mild degenerative changes at L4-5.  IMPRESSION:  Small bowel surgical anastomosis in the left mid abdomen.  Suspected anastomotic breakdown with 3.3 x 11.7 cm gas and fluid collection in the anterior abdomen.  Additional 2.2 x 5.1 cm gas and fluid collection in the left mid abdomen.  Associated mesenteric  fluid/stranding. Original Report Authenticated By: Charline Bills, M.D.    Anti-infectives: Anti-infectives     Start     Dose/Rate Route Frequency Ordered Stop   11/02/11 0900   levofloxacin  (LEVAQUIN) IVPB 500 mg        500 mg 100 mL/hr over 60 Minutes Intravenous Every 24 hours 11/02/11 0750     11/02/11 0900   metroNIDAZOLE (FLAGYL) IVPB 500 mg        500 mg 100 mL/hr over 60 Minutes Intravenous Every 6 hours 11/02/11 0750     10/31/11 1030   metroNIDAZOLE (FLAGYL) IVPB 500 mg  Status:  Discontinued        500 mg 100 mL/hr over 60 Minutes Intravenous  Once 10/31/11 1018 10/31/11 1419   10/31/11 1018   ciprofloxacin (CIPRO) IVPB 400 mg        400 mg 200 mL/hr over 60 Minutes Intravenous 120 min pre-op 10/31/11 1018 10/31/11 1138   10/31/11 0000   metroNIDAZOLE (FLAGYL) IVPB 500 mg  Status:  Discontinued        500 mg 100 mL/hr over 60 Minutes Intravenous  Once 10/30/11 1419 10/30/11 1423   10/30/11 1419   ciprofloxacin (CIPRO) IVPB 400 mg  Status:  Discontinued        400 mg 200 mL/hr over 60 Minutes Intravenous 120 min pre-op 10/30/11 1419 10/30/11 1422          Assessment/Plan: s/p Procedure(s) (LRB): EXPLORATORY LAPAROTOMY (N/A) SMALL BOWEL RESECTION () Bowel function improving. Patient's leukocytosis has increased since yesterday. She remains afebrile. Clinically she is asymptomatic and does not demonstrate any dysuria or chest symptomatology. Her abdominal exam is benign with expected postoperative tenderness. She's been weaned off the TPN has been advanced to a soft diet and is tolerating this well. As I had discussed with the patient previously, due to her increased leukocytosis I do feel patient is warranted a CT evaluation the abdomen and pelvis to ensure there is no persistent intra-abdominal abscess or etiology for her persistent leukocytosis. We'll continue antibiotics at this time. Continue activity as tolerated. Should her CT evaluation return normal, urinalysis will be repeated and stool for C. difficile will be collected although again from a clinical standpoint she does not demonstrate any symptomatology at this.  LOS: 14 days    Ondre Salvetti  C

## 2011-11-09 NOTE — Progress Notes (Signed)
Caitlin Alexander, HOHN                ACCOUNT NO.:  [id number]  MEDICAL RECORD NO.:  [id number]  LOCATION:  A323                          FACILITY:  [facility]  PHYSICIAN:  Kingsley Callander. Ouida Sills, MD       DATE OF BIRTH:  [birth year]  DATE OF PROCEDURE:  11/09/2011 DATE OF DISCHARGE:                                PROGRESS NOTE   SUBJECTIVE:  [name] had some left-sided abdominal discomfort and a low-grade fever of 100.7 overnight.  Her temperature now is 100.3.  She denies vomiting.  She has had 2 recorded stools over the past day.  Her heart rate is 91 with a blood pressure of 120/69.  OBJECTIVE:  LUNGS:  Clear. HEART:  Regular with no murmurs. ABDOMEN:  Tender and mildly distended.  Her incision appears to be healing well.  IMPRESSION/PLAN: 1. Small-bowel obstruction status post laparotomy.  White count is up     to 29.4.  She is now experiencing fever.  She had a CT scan     yesterday which revealed 2 areas of gas and fluid.  The radiologist     was suspicious for an anastomotic breakdown.  This has been     discussed with Dr. Leticia Penna.  The patient today requests transfer to     [facility]. 2. Hyponatremia.  Serum sodium has improved to 128, potassium is     normal at 4.1.  Renal function is stable with a BUN and creatinine     of 6 and 0.54.  Hemoglobin is stable at 10.5.     Kingsley Callander. Ouida Sills, MD     ROF/MEDQ  D:  11/09/2011  T:  11/09/2011  Job:  [id number]

## 2011-11-09 NOTE — Progress Notes (Signed)
Patient ID: Caitlin Alexander, female   DOB: 1936-02-23, 76 y.o.   MRN: [id number]  Return to see patient after CT results were obtained. Clinically patient remained stable and has not had any change in her symptomatology and still denies any significant abdominal pain her symptomatology. I did discuss with her the findings of the CT which demonstrated continued pneumoperitoneum mostly around the area adjacent to her anastomosis. There is no evidence of any large amount of fluid in this area and there is contrast passing distally to this area the small bowel with no evidence of any extravasation of the contrast. Based on this finding I did discuss with the patient possible etiologies. I agree that a anastomotic leak is clearly a possibility although clinically patient remained stable in by no means an extremist. I do feel at this time we'll continue conservative management of this with possible resumption of TPN and bowel rest. Indications for returning to the operating room were discussed with the patient. Patient states understanding of options at this point is in agreement with continued monitoring.  Surgical intervention pending patient's progress.

## 2011-11-09 NOTE — Discharge Summary (Signed)
Physician Discharge Summary  Patient ID: Caitlin Alexander MRN: [id number] DOB/AGE: 1935/06/30 76 y.o.  Admit date: 10/26/2011 Discharge date: 11/09/2011  Admission Diagnoses: Small bowel obstruction  Discharge Diagnoses: Status post ventilatory laparotomy with small bowel resection and suspected current small anastomotic leak. Active Problems:  * No active hospital problems. *    Discharged Condition: stable  Hospital Course: Patient was admitted initially for a clinical presentation of small bowel obstruction. She did have a CT evaluation of the abdomen which demonstrated some dilated loops of small intestine. Unfortunately the transition point was not visualized as it was suspected to be in the pelvis and the CT of the abdomen was actually a annual CT evaluation of the patient due to her history of a pancreatic mass resection. She was admitted initially for conservative management. While she never demonstrated any evidence for acute surgical intervention she continued to be nonprogressive. She continued to have this nausea despite bowel rest and nasogastric tube placement and surgical options were discussed with patient. She did undergo surgical exploratory laparotomy with a small bowel resection. The small bowel that was resected was the suspected transition point. No other abnormalities were noted from a mechanical standpoint. She stretcher back to the floor after a brief period in the PACU. Her immediate postoperative course was slow with the suspected adynamic ileus. She did have the initial postoperative leukocytosis. She was started on enteric antibiotic coverage with broad-spectrum coverage. She did continue to have slow progression of her symptomatology and leukocytosis. Based on the chest x-ray was suspected that the patient had a pneumonia causing her leukocytosis. She did respond to treatment and did have bowel function. With the progression of bowel function she was advanced on her diet.  She is actually tolerating a soft diet without any significant complaints. On postoperative day #8 her leukocytosis again was significantly elevated. There is no clinical change but based on discussions with patient a CT of the abdomen and pelvis was obtained to further evaluate a possible abdominal etiology. At that time there was noted to be a collection of peritoneal air along the anterior abdominal wall. This is in close approximation to the anastomosis. Contrast was seen passing through the anastomosis with no evidence of extravasation. Based on the findings it was suspected she had a small anastomotic leak. Initial discussion was to continue conservative management. Potentially resume total parenteral nutrition. Continue antibiotic coverage. Her clinical course did not. Over the next 24 hours however patient did wish to be transferred to a tertiary care center. Her primary care physician Dr. Ouida Sills recommended she be transferred to This. I entertained the option with the patient and following her wishes requested transfer to the general surgery service at [facility]. Patient is stable.  Consults: None  Significant Diagnostic Studies: labs: Daily labs, CT of the abdomen and pelvis as mentioned above.  Treatments: surgery: Exploratory laparotomy with small bowel resection and primary stapled anastomosis.  Discharge Exam: Blood pressure 120/69, pulse 104, temperature 100.3 F (37.9 C), temperature source Oral, resp. rate 16, height 5\' 2"  (1.575 m), weight 61.236 kg (135 lb), SpO2 93.00%. General appearance: alert and no distress Resp: clear to auscultation bilaterally Cardio: regular rate and rhythm GI: Positive bowel sounds, soft, mild distention, mild to moderate midline abdominal tenderness. No diffuse peritoneal signs. Incision is clean dry and intact. Staples are present. Skin: Skin color, texture, turgor normal. No rashes or lesions  Disposition:     Medication List  As of 11/09/2011  11:41 AM   ASK your doctor about these medications         acetaminophen 500 MG tablet   Commonly known as: TYLENOL   Take 500 mg by mouth daily as needed. For pain      lansoprazole 30 MG capsule   Commonly known as: PREVACID   Take 30 mg by mouth once as needed.      LORazepam 2 MG tablet   Commonly known as: ATIVAN   Take 2 mg by mouth 3 (three) times daily.      lovastatin 20 MG tablet   Commonly known as: MEVACOR   Take 20 mg by mouth every evening.      polyethylene glycol packet   Commonly known as: MIRALAX / GLYCOLAX   Take 17 g by mouth every morning.      VICODIN PO   Take 0.5-1 tablets by mouth once as needed. For pain             Signed: Kassiah Mccrory C 11/09/2011, 11:41 AM

## 2011-11-19 ENCOUNTER — Encounter (HOSPITAL_COMMUNITY): Payer: Self-pay

## 2011-11-19 ENCOUNTER — Emergency Department (HOSPITAL_COMMUNITY): Payer: Medicare Other

## 2011-11-19 ENCOUNTER — Emergency Department (HOSPITAL_COMMUNITY)
Admission: EM | Admit: 2011-11-19 | Discharge: 2011-11-19 | Disposition: A | Discharge status: Home / Self Care | Payer: Medicare Other | Attending: Emergency Medicine | Admitting: Emergency Medicine

## 2011-11-19 DIAGNOSIS — M810 Age-related osteoporosis without current pathological fracture: Secondary | ICD-10-CM | POA: Insufficient documentation

## 2011-11-19 DIAGNOSIS — K219 Gastro-esophageal reflux disease without esophagitis: Secondary | ICD-10-CM | POA: Insufficient documentation

## 2011-11-19 DIAGNOSIS — R079 Chest pain, unspecified: Secondary | ICD-10-CM | POA: Insufficient documentation

## 2011-11-19 DIAGNOSIS — E785 Hyperlipidemia, unspecified: Secondary | ICD-10-CM | POA: Insufficient documentation

## 2011-11-19 LAB — CBC
HCT: 35.2 % — ABNORMAL LOW (ref 36.0–46.0)
Platelets: 767 10*3/uL — ABNORMAL HIGH (ref 150–400)
RDW: 15.4 % (ref 11.5–15.5)
WBC: 12.2 10*3/uL — ABNORMAL HIGH (ref 4.0–10.5)

## 2011-11-19 LAB — TROPONIN I: Troponin I: <0.3 ng/mL (ref <0.30)

## 2011-11-19 LAB — BASIC METABOLIC PANEL
Chloride: 95 mEq/L — ABNORMAL LOW (ref 96–112)
GFR calc Af Amer: >90 mL/min (ref >90)
Potassium: 5.1 mEq/L (ref 3.5–5.1)
Sodium: 134 mEq/L — ABNORMAL LOW (ref 135–145)

## 2011-11-19 NOTE — ED Notes (Signed)
She ate some fish earlier tonight and started complaining of chest pain. History of acid reflux and was given tums. When EMS arrived, patient stated she was no longer having chest pain.

## 2011-11-19 NOTE — ED Provider Notes (Signed)
History     CSN: [id number]  Arrival date & time 11/19/11  0114   First MD Initiated Contact with Patient 11/19/11 0138      Chief Complaint  Patient presents with  . Chest Pain    (Consider location/radiation/quality/duration/timing/severity/associated sxs/prior treatment) HPI Caitlin Alexander is a 77 y.o. female brought in by ambulance, who presents to the Emergency Department complaining of chest pain that began tonight after eating fish. She stats that she developed acid indigestion and anterior chest discomfort. She was given 2 Tums with relief.Once she sat up and took the Tums, the indigestion and chest discomfort went away. She remains pain free.  PCP Dr. Ouida Sills  Past Medical History  Diagnosis Date  . GERD (gastroesophageal reflux disease)   . Hyperlipidemia   . Anxiety   . Arthritis   . Osteoporosis     Past Surgical History  Procedure Date  . Abdominal surgery   . Appendectomy   . Abdominal hysterectomy   . Tubal ligation   . Fissurectomy   . Nasal sinus surgery   . Laparotomy 10/31/2011    Procedure: EXPLORATORY LAPAROTOMY;  Surgeon: Fabio Bering, MD;  Location: [facility]S;  Service: General;  Laterality: N/A;  . Bowel resection 10/31/2011    Procedure: SMALL BOWEL RESECTION;  Surgeon: Fabio Bering, MD;  Location: [facility];  Service: General;;    History reviewed. No pertinent family history.  History  Substance Use Topics  . Smoking status: Never Smoker   . Smokeless tobacco: Not on file  . Alcohol Use: No    OB History    Grav Para Term Preterm Abortions TAB SAB Ect Mult Living                  Review of Systems  Constitutional: Negative for fever.       10 Systems reviewed and are negative for acute change except as noted in the HPI.  HENT: Negative for congestion.   Eyes: Negative for discharge and redness.  Respiratory: Negative for cough and shortness of breath.   Cardiovascular: Positive for chest pain.  Gastrointestinal: Negative for  vomiting and abdominal pain.       Indigestion  Musculoskeletal: Negative for back pain.  Skin: Negative for rash.  Neurological: Negative for syncope, numbness and headaches.  Psychiatric/Behavioral:       No behavior change.    Allergies  Macrodantin; Promethazine; Sulfa antibiotics; and Suprax  Home Medications   Current Outpatient Rx  Name Route Sig Dispense Refill  . ACETAMINOPHEN 500 MG PO TABS Oral Take 500 mg by mouth daily as needed. For pain    . VICODIN PO Oral Take 0.5-1 tablets by mouth once as needed. For pain    . LANSOPRAZOLE 30 MG PO CPDR Oral Take 30 mg by mouth once as needed.    Marland Kitchen LORAZEPAM 2 MG PO TABS Oral Take 2 mg by mouth 3 (three) times daily.     Marland Kitchen LOVASTATIN 20 MG PO TABS Oral Take 20 mg by mouth every evening.     Marland Kitchen POLYETHYLENE GLYCOL 3350 PO PACK Oral Take 17 g by mouth every morning.      BP 119/71  Pulse 93  Temp 99.4 F (37.4 C) (Oral)  Resp 16  Ht 5\' 1"  (1.549 m)  Wt 134 lb (60.782 kg)  BMI 25.32 kg/m2  SpO2 94%  Physical Exam  Nursing note and vitals reviewed. Constitutional: She is oriented to person, place, and time.  Awake, alert, nontoxic appearance.  HENT:  Head: Atraumatic.  Eyes: Right eye exhibits no discharge. Left eye exhibits no discharge.  Neck: Neck supple.  Cardiovascular: Normal heart sounds.   Pulmonary/Chest: Effort normal and breath sounds normal. She exhibits no tenderness.  Abdominal: Soft. There is no tenderness. There is no rebound.  Musculoskeletal: Normal range of motion. She exhibits no tenderness.       Baseline ROM, no obvious new focal weakness.  Neurological: She is alert and oriented to person, place, and time.       Mental status and motor strength appears baseline for patient and situation.  Skin: No rash noted.  Psychiatric: She has a normal mood and affect.    ED Course  Procedures (including critical care time)  Results for orders placed during the hospital encounter of 11/19/11  CBC        Component Value Range   WBC 12.2 (*) 4.0 - 10.5 K/uL   RBC 3.82 (*) 3.87 - 5.11 MIL/uL   Hemoglobin 11.7 (*) 12.0 - 15.0 g/dL   HCT 45.4 (*) 09.8 - 11.9 %   MCV 92.1  78.0 - 100.0 fL   MCH 30.6  26.0 - 34.0 pg   MCHC 33.2  30.0 - 36.0 g/dL   RDW 14.7  82.9 - 56.2 %   Platelets 767 (*) 150 - 400 K/uL  BASIC METABOLIC PANEL      Component Value Range   Sodium 134 (*) 135 - 145 mEq/L   Potassium 5.1  3.5 - 5.1 mEq/L   Chloride 95 (*) 96 - 112 mEq/L   CO2 30  19 - 32 mEq/L   Glucose, Bld 131 (*) 70 - 99 mg/dL   BUN 8  6 - 23 mg/dL   Creatinine, Ser 1.30  0.50 - 1.10 mg/dL   Calcium 9.9  8.4 - 86.5 mg/dL   GFR calc non Af Amer 85 (*) >90 mL/min   GFR calc Af Amer >90  >90 mL/min  TROPONIN I      Component Value Range   Troponin I <0.30  <0.30 ng/mL   Chest Portable 1 View  11/19/2011  *RADIOLOGY REPORT*  Clinical Data: Chest pain, gastroesophageal reflux disease.  PORTABLE CHEST - 1 VIEW  Comparison: 11/04/2011  Findings: Right PICC line has been removed in the interval. Elevated right hemidiaphragm.  Mild right lung base opacity. Mild interstitial prominence, similar to prior.  Aortic tortuosity and arch atherosclerotic calcification.  Heart size within normal limits to mildly enlarged.  No pleural effusion or pneumothorax identified.  Diffuse osteopenia.  No acute osseous finding.  IMPRESSION: Elevated right hemidiaphragm and mild associated opacity, likely scarring or atelectasis.   Original Report Authenticated By: Waneta Martins, M.D.     Date: 11/19/2011    7846  Rate: 92  Rhythm: normal sinus rhythm  QRS Axis: normal  Intervals: normal  ST/T Wave abnormalities: normal  Conduction Disutrbances: none  Narrative Interpretation: unremarkable       MDM  Patient with symptoms c/w reflux brought in for evaluation of possible chest pain. EKG normal, chest xray normal, troponin negative, labs unremarkable. Patient has remained symptom free since taking Tums prior to  arrival. Dx testing d/w pt and family.  Questions answered.  Verb understanding, agreeable to d/c home with outpt f/u.Pt stable in ED with no significant deterioration in condition.The patient appears reasonably screened and/or stabilized for discharge and I doubt any other medical condition or other [facility] requiring further screening, evaluation, or  treatment in the ED at this time prior to discharge.  MDM Reviewed: nursing note and vitals Interpretation: labs, ECG and x-ray           Nicoletta Dress. Colon Branch, MD 11/19/11 [phone]

## 2011-11-19 NOTE — ED Notes (Signed)
Pt discharged. Pt stable at time of discharge. Pt has no questions regarding discharge at this time. Pt voiced understanding of discharge instructions.  

## 2013-01-18 ENCOUNTER — Other Ambulatory Visit (HOSPITAL_COMMUNITY): Payer: Self-pay | Admitting: Internal Medicine

## 2013-01-18 DIAGNOSIS — Z139 Encounter for screening, unspecified: Secondary | ICD-10-CM

## 2013-01-29 ENCOUNTER — Ambulatory Visit (HOSPITAL_COMMUNITY)
Admission: RE | Admit: 2013-01-29 | Discharge: 2013-01-29 | Disposition: A | Discharge status: Home / Self Care | Payer: Medicare Other | Source: Ambulatory Visit | Attending: Internal Medicine | Admitting: Internal Medicine

## 2013-01-29 DIAGNOSIS — Z139 Encounter for screening, unspecified: Secondary | ICD-10-CM

## 2013-01-29 DIAGNOSIS — Z1231 Encounter for screening mammogram for malignant neoplasm of breast: Secondary | ICD-10-CM | POA: Insufficient documentation

## 2013-11-02 IMAGING — CR DG CHEST 2V
2 series · 2 of 2 positions shown · non-contrast
Comparison: 11/08/2007

CLINICAL DATA: Leukocytosis postoperatively

CHEST - 2 VIEW

[view not recorded (1 of 2)]
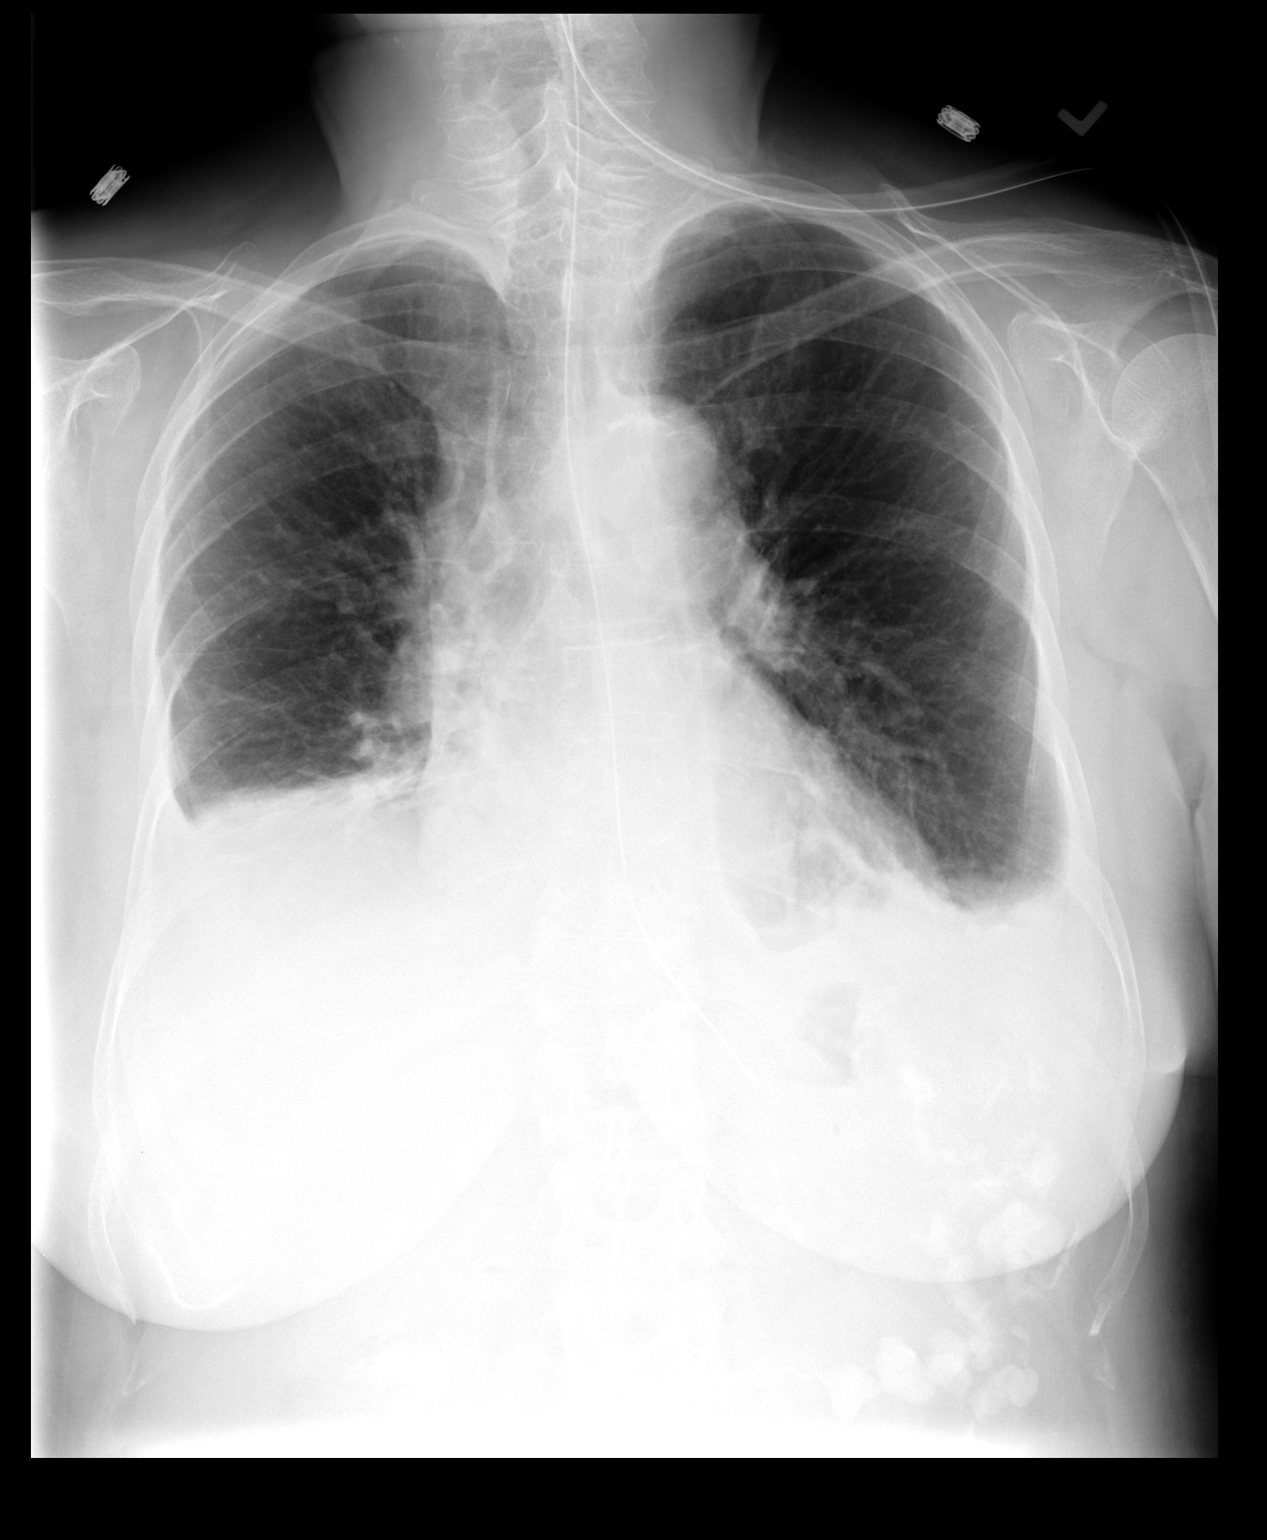

[view not recorded (2 of 2)]
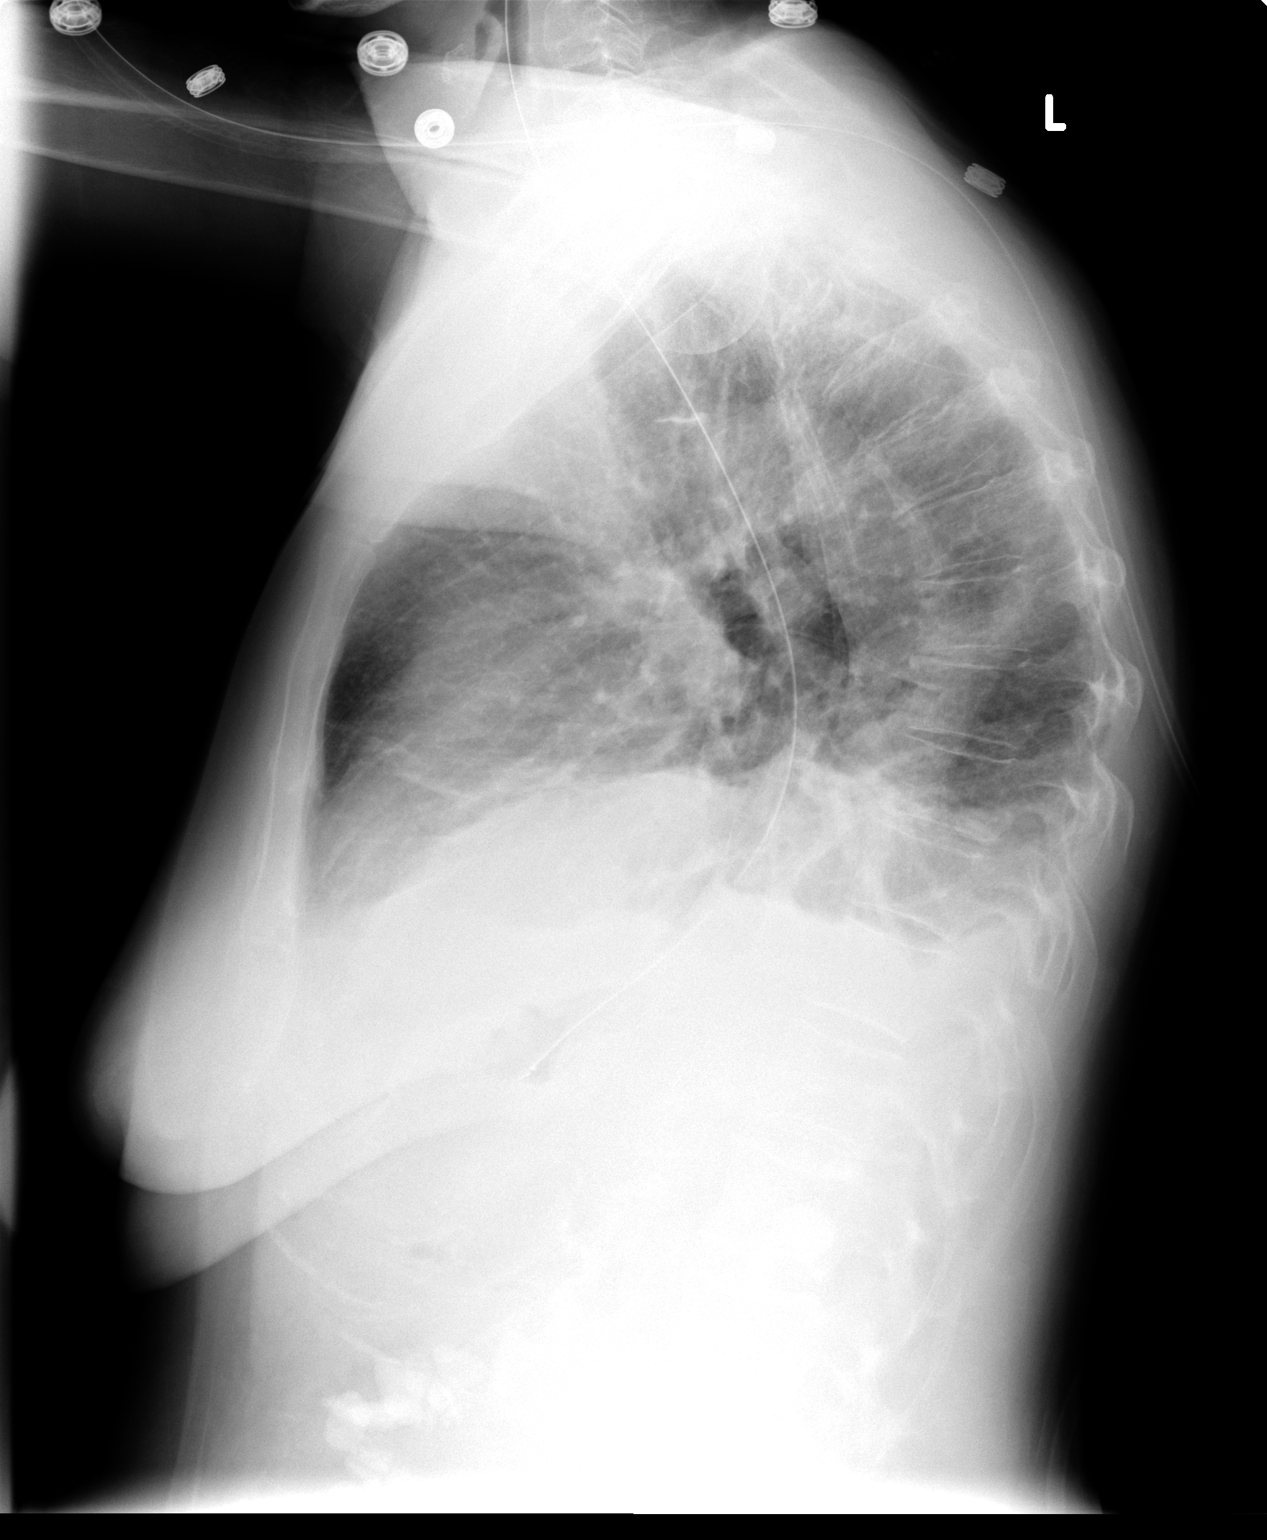

[2 of 2 positions shown; findings below may reference images not displayed]

FINDINGS: The cardiac shadow is stable.  Bibasilar
atelectasis/infiltrate is identified. Mild pleural effusion is
noted bilaterally.  A nasogastric catheter seen with proximal side
port in the distal esophagus.  The tip lies within the stomach.
IMPRESSION: Bibasilar changes.  Given the patient's leukocytosis these are
likely related to acute pneumonic infiltrate.

## 2015-05-04 ENCOUNTER — Other Ambulatory Visit (HOSPITAL_COMMUNITY): Payer: Self-pay | Admitting: Internal Medicine

## 2015-05-04 DIAGNOSIS — Z1231 Encounter for screening mammogram for malignant neoplasm of breast: Secondary | ICD-10-CM

## 2015-05-11 ENCOUNTER — Ambulatory Visit (HOSPITAL_COMMUNITY)
Admission: RE | Admit: 2015-05-11 | Discharge: 2015-05-11 | Disposition: A | Discharge status: Home / Self Care | Payer: Medicare Other | Source: Ambulatory Visit | Attending: Internal Medicine | Admitting: Internal Medicine

## 2015-05-11 DIAGNOSIS — Z1231 Encounter for screening mammogram for malignant neoplasm of breast: Secondary | ICD-10-CM | POA: Insufficient documentation

## 2015-06-30 ENCOUNTER — Emergency Department (HOSPITAL_COMMUNITY)
Admission: EM | Admit: 2015-06-30 | Discharge: 2015-06-30 | Disposition: A | Discharge status: Home / Self Care | Payer: Medicare Other | Attending: Emergency Medicine | Admitting: Emergency Medicine

## 2015-06-30 ENCOUNTER — Encounter (HOSPITAL_COMMUNITY): Payer: Self-pay | Admitting: Emergency Medicine

## 2015-06-30 ENCOUNTER — Emergency Department (HOSPITAL_COMMUNITY): Payer: Medicare Other

## 2015-06-30 DIAGNOSIS — M810 Age-related osteoporosis without current pathological fracture: Secondary | ICD-10-CM | POA: Insufficient documentation

## 2015-06-30 DIAGNOSIS — Z79899 Other long term (current) drug therapy: Secondary | ICD-10-CM | POA: Insufficient documentation

## 2015-06-30 DIAGNOSIS — R002 Palpitations: Secondary | ICD-10-CM | POA: Diagnosis present

## 2015-06-30 DIAGNOSIS — M1990 Unspecified osteoarthritis, unspecified site: Secondary | ICD-10-CM | POA: Diagnosis not present

## 2015-06-30 DIAGNOSIS — E785 Hyperlipidemia, unspecified: Secondary | ICD-10-CM | POA: Insufficient documentation

## 2015-06-30 LAB — CBC WITH DIFFERENTIAL/PLATELET
BASOS ABS: 0.1 10*3/uL (ref 0.0–0.1)
BASOS PCT: 1 %
EOS PCT: 2 %
Eosinophils Absolute: 0.2 10*3/uL (ref 0.0–0.7)
HEMATOCRIT: 40.9 % (ref 36.0–46.0)
Hemoglobin: 14 g/dL (ref 12.0–15.0)
LYMPHS PCT: 49 %
Lymphs Abs: 4 10*3/uL (ref 0.7–4.0)
MCH: 31.7 pg (ref 26.0–34.0)
MCHC: 34.2 g/dL (ref 30.0–36.0)
MCV: 92.7 fL (ref 78.0–100.0)
MONO ABS: 1 10*3/uL (ref 0.1–1.0)
Monocytes Relative: 13 %
NEUTROS ABS: 2.8 10*3/uL (ref 1.7–7.7)
Neutrophils Relative %: 35 %
PLATELETS: 394 10*3/uL (ref 150–400)
RBC: 4.41 MIL/uL (ref 3.87–5.11)
RDW: 13.2 % (ref 11.5–15.5)
WBC: 8 10*3/uL (ref 4.0–10.5)

## 2015-06-30 LAB — COMPREHENSIVE METABOLIC PANEL
ALBUMIN: 4.1 g/dL (ref 3.5–5.0)
ALT: 24 U/L (ref 14–54)
AST: 27 U/L (ref 15–41)
Alkaline Phosphatase: 65 U/L (ref 38–126)
Anion gap: 10 (ref 5–15)
BUN: 8 mg/dL (ref 6–20)
CHLORIDE: 103 mmol/L (ref 101–111)
CO2: 27 mmol/L (ref 22–32)
CREATININE: 0.72 mg/dL (ref 0.44–1.00)
Calcium: 9.2 mg/dL (ref 8.9–10.3)
GFR calc Af Amer: >60 mL/min (ref >60)
GFR calc non Af Amer: >60 mL/min (ref >60)
GLUCOSE: 102 mg/dL — AB (ref 65–99)
POTASSIUM: 4.6 mmol/L (ref 3.5–5.1)
Sodium: 140 mmol/L (ref 135–145)
Total Bilirubin: 1.1 mg/dL (ref 0.3–1.2)
Total Protein: 7.2 g/dL (ref 6.5–8.1)

## 2015-06-30 LAB — TROPONIN I
Troponin I: <0.03 ng/mL (ref <0.031)
Troponin I: <0.03 ng/mL (ref <0.031)

## 2015-06-30 LAB — TSH: TSH: 0.699 u[IU]/mL (ref 0.350–4.500)

## 2015-06-30 NOTE — Discharge Instructions (Signed)
Palpitations Follow up with Dr. Willey Blade and the cardiologist. Return to the ED if you develop chest pain, shortness of breath, or any other concerns. A palpitation is the feeling that your heartbeat is irregular or is faster than normal. It may feel like your heart is fluttering or skipping a beat. Palpitations are usually not a serious problem. However, in some cases, you may need further medical evaluation. CAUSES  Palpitations can be caused by:  Smoking.  Caffeine or other stimulants, such as diet pills or energy drinks.  Alcohol.  Stress and anxiety.  Strenuous physical activity.  Fatigue.  Certain medicines.  Heart disease, especially if you have a history of irregular heart rhythms (arrhythmias), such as atrial fibrillation, atrial flutter, or supraventricular tachycardia.  An improperly working pacemaker or defibrillator. DIAGNOSIS  To find the cause of your palpitations, your health care provider will take your medical history and perform a physical exam. Your health care provider may also have you take a test called an ambulatory electrocardiogram (ECG). An ECG records your heartbeat patterns over a 24-hour period. You may also have other tests, such as:  Transthoracic echocardiogram (TTE). During echocardiography, sound waves are used to evaluate how blood flows through your heart.  Transesophageal echocardiogram (TEE).  Cardiac monitoring. This allows your health care provider to monitor your heart rate and rhythm in real time.  Holter monitor. This is a portable device that records your heartbeat and can help diagnose heart arrhythmias. It allows your health care provider to track your heart activity for several days, if needed.  Stress tests by exercise or by giving medicine that makes the heart beat faster. TREATMENT  Treatment of palpitations depends on the cause of your symptoms and can vary greatly. Most cases of palpitations do not require any treatment other than  time, relaxation, and monitoring your symptoms. Other causes, such as atrial fibrillation, atrial flutter, or supraventricular tachycardia, usually require further treatment. HOME CARE INSTRUCTIONS   Avoid:  Caffeinated coffee, tea, soft drinks, diet pills, and energy drinks.  Chocolate.  Alcohol.  Stop smoking if you smoke.  Reduce your stress and anxiety. Things that can help you relax include:  A method of controlling things in your body, such as your heartbeats, with your mind (biofeedback).  Yoga.  Meditation.  Physical activity such as swimming, jogging, or walking.  Get plenty of rest and sleep. SEEK MEDICAL CARE IF:   You continue to have a fast or irregular heartbeat beyond 24 hours.  Your palpitations occur more often. SEEK IMMEDIATE MEDICAL CARE IF:  You have chest pain or shortness of breath.  You have a severe headache.  You feel dizzy or you faint. MAKE SURE YOU:  Understand these instructions.  Will watch your condition.  Will get help right away if you are not doing well or get worse.   This information is not intended to replace advice given to you by your health care provider. Make sure you discuss any questions you have with your health care provider.   Document Released: 03/11/2000 Document Revised: 03/19/2013 Document Reviewed: 05/13/2011 Elsevier Interactive Patient Education [employer].

## 2015-06-30 NOTE — ED Notes (Signed)
Pt received lunch tray 

## 2015-06-30 NOTE — ED Provider Notes (Signed)
CSN: [id number]     Arrival date & time 06/30/15  1126 History   None    Chief Complaint  Patient presents with  . Palpitations     (Consider location/radiation/quality/duration/timing/severity/associated sxs/prior Treatment) HPI Comments: Patient presents with intermittent palpitations, racing heart, and "heart skipping beats" for the past month after her Ativan was decreased in dose by her PCP. States she's been on Ativan 2 mg 3 times daily for the past 15 years. One month ago her doctor decreased her dose to 1 mg 3 times daily and this is when her palpitations and racing heart started. She states she feels pounding in her chest, racing heartbeat that lasts for several minutes to hours at a time. She's had several episodes of "heart skipping beat" for about 15 minutes at a time and most recently 1 week ago. She came in today because she had another episode of racing heartbeat this morning. She checked her pulse and it was 90. She thought this was racing.  She is upset with her primary doctor for decreasing her benzodiazepine dose. She denies any history of cardiac issues she has never had a heart attack. Her only other medications is for high cholesterol. She denies drinking any alcohol.  The history is provided by the patient.    Past Medical History  Diagnosis Date  . GERD (gastroesophageal reflux disease)   . Hyperlipidemia   . Anxiety   . Arthritis   . Osteoporosis    Past Surgical History  Procedure Laterality Date  . Abdominal surgery    . Appendectomy    . Abdominal hysterectomy    . Tubal ligation    . Fissurectomy    . Nasal sinus surgery    . Laparotomy  10/31/2011    Procedure: EXPLORATORY LAPAROTOMY;  Surgeon: Donato Heinz, MD;  Location: [facility];  Service: General;  Laterality: N/A;  . Bowel resection  10/31/2011    Procedure: SMALL BOWEL RESECTION;  Surgeon: Donato Heinz, MD;  Location: [facility];  Service: General;;   History reviewed. No pertinent family  history. Social History  Substance Use Topics  . Smoking status: Never Smoker   . Smokeless tobacco: None  . Alcohol Use: No   OB History    No data available     Review of Systems  Constitutional: Negative for fever, activity change and appetite change.  HENT: Negative for congestion and rhinorrhea.   Respiratory: Positive for chest tightness. Negative for cough and shortness of breath.   Cardiovascular: Positive for palpitations. Negative for chest pain.  Gastrointestinal: Negative for nausea, vomiting and abdominal pain.  Genitourinary: Negative for dysuria, hematuria, vaginal bleeding and vaginal discharge.  Musculoskeletal: Negative for myalgias and arthralgias.  Skin: Negative for rash.  Neurological: Negative for dizziness, weakness and headaches.  A complete 10 system review of systems was obtained and all systems are negative except as noted in the HPI and PMH.      Allergies  Macrodantin; Promethazine; Sulfa antibiotics; and Suprax  Home Medications   Prior to Admission medications   Medication Sig Start Date End Date Taking? Authorizing Provider  acetaminophen (TYLENOL) 500 MG tablet Take 500 mg by mouth daily as needed. For pain   Yes Historical Provider, MD  LORazepam (ATIVAN) 1 MG tablet Take 1 mg by mouth every 8 (eight) hours.   Yes Historical Provider, MD  lovastatin (MEVACOR) 20 MG tablet Take 20 mg by mouth every evening.  10/25/11  Yes Historical Provider, MD  polyethylene glycol (  MIRALAX / GLYCOLAX) packet Take 17 g by mouth every morning.   Yes Historical Provider, MD  Hydrocodone-Acetaminophen (VICODIN PO) Take 0.5-1 tablets by mouth once as needed. For pain    Historical Provider, MD   BP 112/70 mmHg  Pulse 68  Temp(Src) 98.2 F (36.8 C) (Oral)  Resp 16  Ht 5' 2.5" (1.588 m)  Wt 133 lb (60.328 kg)  BMI 23.92 kg/m2  SpO2 95% Physical Exam  Constitutional: She is oriented to person, place, and time. She appears well-developed and well-nourished.  No distress.  Anxious appearing  HENT:  Head: Normocephalic and atraumatic.  Mouth/Throat: Oropharynx is clear and moist. No oropharyngeal exudate.  Eyes: Conjunctivae and EOM are normal. Pupils are equal, round, and reactive to light.  Neck: Normal range of motion. Neck supple.  No meningismus.  Cardiovascular: Normal rate, regular rhythm, normal heart sounds and intact distal pulses.   No murmur heard. Pulmonary/Chest: Effort normal and breath sounds normal. No respiratory distress.  Abdominal: Soft. There is no tenderness. There is no rebound and no guarding.  Musculoskeletal: Normal range of motion. She exhibits no edema or tenderness.  Neurological: She is alert and oriented to person, place, and time. No cranial nerve deficit. She exhibits normal muscle tone. Coordination normal.  No ataxia on finger to nose bilaterally. No pronator drift. 5/5 strength throughout. CN 2-12 intact.Equal grip strength. Sensation intact.   Skin: Skin is warm.  Psychiatric: She has a normal mood and affect. Her behavior is normal.  Nursing note and vitals reviewed.   ED Course  Procedures (including critical care time) Labs Review Labs Reviewed  COMPREHENSIVE METABOLIC PANEL - Abnormal; Notable for the following:    Glucose, Bld 102 (*)    All other components within normal limits  CBC WITH DIFFERENTIAL/PLATELET  TSH  TROPONIN I  TROPONIN I    Imaging Review Dg Chest 2 View  06/30/2015  CLINICAL DATA:  Heart racing and skipping. EXAM: CHEST  2 VIEW COMPARISON:  03/25/2013 FINDINGS: Lungs are markedly hyperaerated with interstitial prominence. Increased AP diameter of the chest. Normal heart size. No pneumothorax or pleural effusion. No mass or consolidation. IMPRESSION: No active cardiopulmonary disease. Electronically Signed   By: Marybelle Killings M.D.   On: 06/30/2015 12:47   I have personally reviewed and evaluated these images and lab results as part of my medical decision-making.   EKG  Interpretation   Date/Time:  Tuesday June 30 2015 11:35:12 EDT Ventricular Rate:  77 PR Interval:  150 QRS Duration: 93 QT Interval:  381 QTC Calculation: 431 R Axis:   40 Text Interpretation:  Sinus rhythm Low voltage, precordial leads  Nonspecific T abnormalities, anterior leads Baseline wander in lead(s) V6  Confirmed by Winfred Leeds  MD, SAM [phone]) on 06/30/2015 11:44:13 AM      MDM   Final diagnoses:  Palpitations     intermittent palpitations and racing heart for the past 1 month after recent decrease in benzodiazepine dose. States no symptoms prior to this medication change.   EKG with nonspecific ST depression and T-wave inversion lead V3 and V4, III, similar to previous. Reviewed with Dr. Domenic Polite who agrees that changes are nonspecific.  CXR negative.  Labs unremarkable. Troponin negative.  Suspect symptoms may be due to recent Ativan decreased. Workup reassuring.  Patient would benefit from cardiology follow-up as an outpatient for possible Holter monitor. Troponins are negative 2. Her symptoms may be related to her recent decreased Ativan dose. Return to the ED with worsening symptoms  including chest pain, shortness of breath or any other concerns.    Ezequiel Essex, MD 06/30/15 [phone]

## 2015-06-30 NOTE — ED Notes (Signed)
MD at bedside. 

## 2015-06-30 NOTE — ED Notes (Signed)
Pt states that Dr. Willey Blade did not send her, she came on her own.  Denies pain or palpitations at present

## 2015-06-30 NOTE — ED Notes (Addendum)
Pt reports was sent over by Dr. Ria Comment office. Pt reports has had palpitations since doctor cut lorazepam dose in half one month ago. Pt denies cp,sob. nad noted.

## 2015-06-30 NOTE — ED Notes (Signed)
Pt tolerated ambulation well.

## 2015-07-01 ENCOUNTER — Ambulatory Visit: Payer: Medicare Other | Admitting: Cardiovascular Disease

## 2016-03-28 DIAGNOSIS — C50919 Malignant neoplasm of unspecified site of unspecified female breast: Secondary | ICD-10-CM

## 2016-03-28 HISTORY — DX: Malignant neoplasm of unspecified site of unspecified female breast: C50.919

## 2016-12-21 ENCOUNTER — Other Ambulatory Visit (HOSPITAL_COMMUNITY): Payer: Self-pay | Admitting: Internal Medicine

## 2016-12-21 DIAGNOSIS — Z1231 Encounter for screening mammogram for malignant neoplasm of breast: Secondary | ICD-10-CM

## 2016-12-22 ENCOUNTER — Ambulatory Visit (HOSPITAL_COMMUNITY)
Admission: RE | Admit: 2016-12-22 | Discharge: 2016-12-22 | Disposition: A | Discharge status: Home / Self Care | Payer: Medicare Other | Source: Ambulatory Visit | Attending: Internal Medicine | Admitting: Internal Medicine

## 2016-12-22 DIAGNOSIS — R928 Other abnormal and inconclusive findings on diagnostic imaging of breast: Secondary | ICD-10-CM | POA: Diagnosis not present

## 2016-12-22 DIAGNOSIS — R921 Mammographic calcification found on diagnostic imaging of breast: Secondary | ICD-10-CM | POA: Diagnosis not present

## 2016-12-22 DIAGNOSIS — Z1231 Encounter for screening mammogram for malignant neoplasm of breast: Secondary | ICD-10-CM

## 2016-12-23 ENCOUNTER — Other Ambulatory Visit (HOSPITAL_COMMUNITY): Payer: Self-pay | Admitting: Internal Medicine

## 2016-12-23 DIAGNOSIS — N6310 Unspecified lump in the right breast, unspecified quadrant: Secondary | ICD-10-CM

## 2016-12-27 ENCOUNTER — Ambulatory Visit (HOSPITAL_COMMUNITY)
Admission: RE | Admit: 2016-12-27 | Discharge: 2016-12-27 | Disposition: A | Discharge status: Home / Self Care | Payer: Medicare Other | Source: Ambulatory Visit | Attending: Internal Medicine | Admitting: Internal Medicine

## 2016-12-27 DIAGNOSIS — N6310 Unspecified lump in the right breast, unspecified quadrant: Secondary | ICD-10-CM

## 2016-12-29 ENCOUNTER — Other Ambulatory Visit: Payer: Self-pay | Admitting: Internal Medicine

## 2016-12-29 DIAGNOSIS — R921 Mammographic calcification found on diagnostic imaging of breast: Secondary | ICD-10-CM

## 2017-01-05 ENCOUNTER — Ambulatory Visit
Admission: RE | Admit: 2017-01-05 | Discharge: 2017-01-05 | Disposition: A | Discharge status: Home / Self Care | Payer: Medicare Other | Source: Ambulatory Visit | Attending: Internal Medicine | Admitting: Internal Medicine

## 2017-01-05 DIAGNOSIS — R921 Mammographic calcification found on diagnostic imaging of breast: Secondary | ICD-10-CM

## 2017-01-12 ENCOUNTER — Ambulatory Visit: Payer: Medicare Other | Admitting: General Surgery

## 2017-01-27 ENCOUNTER — Other Ambulatory Visit: Payer: Self-pay | Admitting: General Surgery

## 2017-01-27 DIAGNOSIS — D0511 Intraductal carcinoma in situ of right breast: Secondary | ICD-10-CM

## 2017-01-31 NOTE — Pre-Procedure Instructions (Addendum)
Caitlin Alexander  01/31/2017      [id number] [place], [place] [facility] [place] [address] [place] [address] Phone: [phone] Fax: [phone]    Your procedure is scheduled on Tuesday 02/07/2017.  Report to [facility] Ad[facility] at [place].  Call this number if you have problems the morning of surgery:  [phone]   Remember:   Drink 2 bottles of Ensure Pre-Surgery drink starting at 5:00pm the evening before your surgery.   Do not eat food or drink liquids after midnight the night before your surgery.   Please complete 1 bottle of Ensure Pre-Surgery drink by 3:30am the morning of your surgery.   Continue all other medications as directed by your physician except follow these medication instructions before surgery   Take these medicines the morning of surgery with A SIP OF WATER:  Acetaminophen (Tylenol) - if needed  Carboxymethylcellulose Sodium (Theratears OP) eye drops - if needed  Lorazepam (Ativan)   7 days prior to surgery STOP taking any Aspirin (unless otherwise instructed by your surgeon), Aleve, Naproxen, Ibuprofen, Motrin, Advil, Goody's, BC's, all herbal medications, fish oil, and all vitamins    Do not wear jewelry  Do not wear lotions, powders, or colognes, or deodorant.  Men may shave face and neck.  Do not bring valuables to the hospital.  [facility] is not responsible for any belongings or valuables.  Contacts, eyeglasses, dentures or bridgework may not be worn into surgery.  Leave your suitcase in the car.  After surgery it may be brought to your room.  For patients admitted to the hospital, discharge time will be determined by your treatment team.  Patients discharged the day of surgery will not be allowed to drive home.   Name and phone number of your driver:    Special instructions:   [place]- Preparing For Surgery  Before surgery, you can play an important role. Because skin is not  sterile, your skin needs to be as free of germs as possible. You can reduce the number of germs on your skin by washing with CHG (chlorahexidine gluconate) Soap before surgery.  CHG is an antiseptic cleaner which kills germs and bonds with the skin to continue killing germs even after washing.  Please do not use if you have an allergy to CHG or antibacterial soaps. If your skin becomes reddened/irritated stop using the CHG.  Do not shave (including legs and underarms) for at least 48 hours prior to first CHG shower. It is OK to shave your face.  Please follow these instructions carefully.   1. Shower the NIGHT BEFORE SURGERY and the MORNING OF SURGERY with CHG.   2. If you chose to wash your hair, wash your hair first as usual with your normal shampoo.  3. After you shampoo, rinse your hair and body thoroughly to remove the shampoo.  4. Use CHG as you would any other liquid soap. You can apply CHG directly to the skin and wash gently with a scrungie or a clean washcloth.   5. Apply the CHG Soap to your body ONLY FROM THE NECK DOWN.  Do not use on open wounds or open sores. Avoid contact with your eyes, ears, mouth and genitals (private parts). Wash Face and genitals (private parts)  with your normal soap.  6. Wash thoroughly, paying special attention to the area where your surgery will be performed.  7. Thoroughly rinse your body with warm water from  the neck down.  8. DO NOT shower/wash with your normal soap after using and rinsing off the CHG Soap.  9. Pat yourself dry with a CLEAN TOWEL.  10. Wear CLEAN PAJAMAS to bed the night before surgery, wear comfortable clothes the morning of surgery  11. Place CLEAN SHEETS on your bed the night of your first shower and DO NOT SLEEP WITH PETS.    Day of Surgery: Shower as stated above. Do not apply any deodorants/lotions.  Please wear clean clothes to the hospital/surgery center.      Please read over the following fact sheets that you  were given. Coughing and Deep Breathing, MRSA Information and Surgical Site Infection Prevention

## 2017-02-01 ENCOUNTER — Encounter (HOSPITAL_COMMUNITY)
Admission: RE | Admit: 2017-02-01 | Discharge: 2017-02-01 | Disposition: A | Discharge status: Home / Self Care | Payer: Medicare Other | Source: Ambulatory Visit | Attending: General Surgery | Admitting: General Surgery

## 2017-02-01 ENCOUNTER — Encounter (HOSPITAL_COMMUNITY): Payer: Self-pay

## 2017-02-01 DIAGNOSIS — C50911 Malignant neoplasm of unspecified site of right female breast: Secondary | ICD-10-CM | POA: Insufficient documentation

## 2017-02-01 DIAGNOSIS — Z01812 Encounter for preprocedural laboratory examination: Secondary | ICD-10-CM | POA: Diagnosis not present

## 2017-02-01 DIAGNOSIS — Z01818 Encounter for other preprocedural examination: Secondary | ICD-10-CM | POA: Insufficient documentation

## 2017-02-01 HISTORY — DX: Other specified postprocedural states: R11.2

## 2017-02-01 HISTORY — DX: Malignant (primary) neoplasm, unspecified: C80.1

## 2017-02-01 HISTORY — DX: Nausea with vomiting, unspecified: Z98.890

## 2017-02-01 LAB — BASIC METABOLIC PANEL
ANION GAP: 9 (ref 5–15)
BUN: 8 mg/dL (ref 6–20)
CALCIUM: 9.5 mg/dL (ref 8.9–10.3)
CHLORIDE: 103 mmol/L (ref 101–111)
CO2: 26 mmol/L (ref 22–32)
CREATININE: 0.79 mg/dL (ref 0.44–1.00)
GFR calc non Af Amer: >60 mL/min (ref >60)
Glucose, Bld: 89 mg/dL (ref 65–99)
Potassium: 4.7 mmol/L (ref 3.5–5.1)
SODIUM: 138 mmol/L (ref 135–145)

## 2017-02-01 LAB — CBC
HEMATOCRIT: 44.3 % (ref 36.0–46.0)
HEMOGLOBIN: 15.1 g/dL — AB (ref 12.0–15.0)
MCH: 31.7 pg (ref 26.0–34.0)
MCHC: 34.1 g/dL (ref 30.0–36.0)
MCV: 93.1 fL (ref 78.0–100.0)
Platelets: 341 10*3/uL (ref 150–400)
RBC: 4.76 MIL/uL (ref 3.87–5.11)
RDW: 13.3 % (ref 11.5–15.5)
WBC: 11 10*3/uL — AB (ref 4.0–10.5)

## 2017-02-06 ENCOUNTER — Ambulatory Visit
Admission: RE | Admit: 2017-02-06 | Discharge: 2017-02-06 | Disposition: A | Discharge status: Home / Self Care | Payer: Medicare Other | Source: Ambulatory Visit | Attending: General Surgery | Admitting: General Surgery

## 2017-02-06 DIAGNOSIS — D0511 Intraductal carcinoma in situ of right breast: Secondary | ICD-10-CM

## 2017-02-07 ENCOUNTER — Encounter (HOSPITAL_COMMUNITY): Payer: Self-pay | Admitting: *Deleted

## 2017-02-07 ENCOUNTER — Ambulatory Visit
Admission: RE | Admit: 2017-02-07 | Discharge: 2017-02-07 | Disposition: A | Discharge status: Home / Self Care | Payer: Medicare Other | Source: Ambulatory Visit | Attending: General Surgery | Admitting: General Surgery

## 2017-02-07 ENCOUNTER — Encounter (HOSPITAL_COMMUNITY)
Admission: RE | Disposition: A | Discharge status: Home / Self Care | Payer: Self-pay | Source: Ambulatory Visit | Attending: General Surgery

## 2017-02-07 ENCOUNTER — Ambulatory Visit (HOSPITAL_COMMUNITY)
Admission: RE | Admit: 2017-02-07 | Discharge: 2017-02-07 | Disposition: A | Discharge status: Home / Self Care | Payer: Medicare Other | Source: Ambulatory Visit | Attending: General Surgery | Admitting: General Surgery

## 2017-02-07 ENCOUNTER — Ambulatory Visit (HOSPITAL_COMMUNITY): Payer: Medicare Other | Admitting: Certified Registered Nurse Anesthetist

## 2017-02-07 DIAGNOSIS — D0511 Intraductal carcinoma in situ of right breast: Secondary | ICD-10-CM

## 2017-02-07 DIAGNOSIS — E7800 Pure hypercholesterolemia, unspecified: Secondary | ICD-10-CM | POA: Insufficient documentation

## 2017-02-07 DIAGNOSIS — Z79899 Other long term (current) drug therapy: Secondary | ICD-10-CM | POA: Diagnosis not present

## 2017-02-07 DIAGNOSIS — E039 Hypothyroidism, unspecified: Secondary | ICD-10-CM | POA: Insufficient documentation

## 2017-02-07 HISTORY — PX: BREAST LUMPECTOMY WITH RADIOACTIVE SEED LOCALIZATION: SHX6424

## 2017-02-07 SURGERY — BREAST LUMPECTOMY WITH RADIOACTIVE SEED LOCALIZATION
Anesthesia: General | Site: Breast | Laterality: Right

## 2017-02-07 MED ORDER — BUPIVACAINE-EPINEPHRINE (PF) 0.25% -1:200000 IJ SOLN
INTRAMUSCULAR | Status: AC
  Filled 2017-02-07: qty 30

## 2017-02-07 MED ORDER — SODIUM CHLORIDE 0.9 % IV SOLN: 250.0000 mL | INTRAVENOUS | Status: DC | PRN

## 2017-02-07 MED ORDER — SODIUM CHLORIDE 0.9 % IV SOLN: INTRAVENOUS | Status: DC

## 2017-02-07 MED ORDER — ACETAMINOPHEN 650 MG RE SUPP: 650.0000 mg | RECTAL | Status: DC | PRN

## 2017-02-07 MED ORDER — 0.9 % SODIUM CHLORIDE (POUR BTL) OPTIME
TOPICAL | Status: DC | PRN
  Administered 2017-02-07: 1000 mL

## 2017-02-07 MED ORDER — GABAPENTIN 300 MG PO CAPS
300.0000 mg | ORAL_CAPSULE | ORAL | Status: AC
  Administered 2017-02-07: 300 mg via ORAL
  Filled 2017-02-07: qty 1

## 2017-02-07 MED ORDER — BUPIVACAINE-EPINEPHRINE 0.25% -1:200000 IJ SOLN
INTRAMUSCULAR | Status: DC | PRN
  Administered 2017-02-07: 10 mL

## 2017-02-07 MED ORDER — OXYCODONE HCL 5 MG PO TABS: 5.0000 mg | ORAL_TABLET | ORAL | Status: DC | PRN

## 2017-02-07 MED ORDER — DEXAMETHASONE SODIUM PHOSPHATE 10 MG/ML IJ SOLN
INTRAMUSCULAR | Status: DC | PRN
  Administered 2017-02-07: 5 mg via INTRAVENOUS

## 2017-02-07 MED ORDER — EPHEDRINE SULFATE 50 MG/ML IJ SOLN
INTRAMUSCULAR | Status: DC | PRN
  Administered 2017-02-07 (×3): 10 mg via INTRAVENOUS

## 2017-02-07 MED ORDER — PROPOFOL 10 MG/ML IV BOLUS
INTRAVENOUS | Status: DC | PRN
  Administered 2017-02-07: 50 mg via INTRAVENOUS
  Administered 2017-02-07: 100 mg via INTRAVENOUS
  Administered 2017-02-07: 50 mg via INTRAVENOUS

## 2017-02-07 MED ORDER — ACETAMINOPHEN 325 MG PO TABS
650.0000 mg | ORAL_TABLET | ORAL | Status: DC | PRN
Start: 2017-02-07 — End: 2017-02-07

## 2017-02-07 MED ORDER — ACETAMINOPHEN 500 MG PO TABS
1000.0000 mg | ORAL_TABLET | ORAL | Status: AC
  Administered 2017-02-07: 1000 mg via ORAL
  Filled 2017-02-07: qty 2

## 2017-02-07 MED ORDER — FENTANYL CITRATE (PF) 250 MCG/5ML IJ SOLN
INTRAMUSCULAR | Status: AC
  Filled 2017-02-07: qty 5

## 2017-02-07 MED ORDER — LIDOCAINE 2% (20 MG/ML) 5 ML SYRINGE
INTRAMUSCULAR | Status: DC | PRN
  Administered 2017-02-07: 100 mg via INTRAVENOUS

## 2017-02-07 MED ORDER — ENSURE PRE-SURGERY PO LIQD: 592.0000 mL | Freq: Once | ORAL | Status: DC

## 2017-02-07 MED ORDER — MIDAZOLAM HCL 2 MG/2ML IJ SOLN
INTRAMUSCULAR | Status: AC
  Filled 2017-02-07: qty 2

## 2017-02-07 MED ORDER — PROPOFOL 10 MG/ML IV BOLUS
INTRAVENOUS | Status: AC
  Filled 2017-02-07: qty 40

## 2017-02-07 MED ORDER — ONDANSETRON HCL 4 MG/2ML IJ SOLN
INTRAMUSCULAR | Status: DC | PRN
  Administered 2017-02-07: 4 mg via INTRAVENOUS

## 2017-02-07 MED ORDER — HYDROMORPHONE HCL 1 MG/ML IJ SOLN
0.2500 mg | INTRAMUSCULAR | Status: DC | PRN
Start: 2017-02-07 — End: 2017-02-07

## 2017-02-07 MED ORDER — SODIUM CHLORIDE 0.9% FLUSH: 3.0000 mL | INTRAVENOUS | Status: DC | PRN

## 2017-02-07 MED ORDER — SODIUM CHLORIDE 0.9% FLUSH: 3.0000 mL | Freq: Two times a day (BID) | INTRAVENOUS | Status: DC

## 2017-02-07 MED ORDER — CIPROFLOXACIN IN D5W 400 MG/200ML IV SOLN
400.0000 mg | INTRAVENOUS | Status: AC
  Administered 2017-02-07: 400 mg via INTRAVENOUS
  Filled 2017-02-07: qty 200

## 2017-02-07 MED ORDER — MIDAZOLAM HCL 5 MG/5ML IJ SOLN
INTRAMUSCULAR | Status: DC | PRN
  Administered 2017-02-07: 2 mg via INTRAVENOUS

## 2017-02-07 MED ORDER — LACTATED RINGERS IV SOLN
INTRAVENOUS | Status: DC | PRN
  Administered 2017-02-07: 07:00:00 via INTRAVENOUS

## 2017-02-07 MED ORDER — FENTANYL CITRATE (PF) 100 MCG/2ML IJ SOLN
INTRAMUSCULAR | Status: DC | PRN
  Administered 2017-02-07 (×3): 25 ug via INTRAVENOUS

## 2017-02-07 SURGICAL SUPPLY — 50 items
ADH SKN CLS APL DERMABOND .7 (GAUZE/BANDAGES/DRESSINGS) ×1
APPLIER CLIP 9.375 MED OPEN (MISCELLANEOUS) ×3
APR CLP MED 9.3 20 MLT OPN (MISCELLANEOUS) ×1
BINDER BREAST LRG (GAUZE/BANDAGES/DRESSINGS) ×2 IMPLANT
BINDER BREAST XLRG (GAUZE/BANDAGES/DRESSINGS) IMPLANT
BLADE SURG 15 STRL LF DISP TIS (BLADE) ×1 IMPLANT
BLADE SURG 15 STRL SS (BLADE) ×3
CANISTER SUCT 3000ML PPV (MISCELLANEOUS) ×3 IMPLANT
CHLORAPREP W/TINT 26ML (MISCELLANEOUS) ×3 IMPLANT
CLIP APPLIE 9.375 MED OPEN (MISCELLANEOUS) IMPLANT
CLOSURE WOUND 1/2 X4 (GAUZE/BANDAGES/DRESSINGS) ×1
CONT SPEC 4OZ CLIKSEAL STRL BL (MISCELLANEOUS) ×2 IMPLANT
COVER PROBE W GEL 5X96 (DRAPES) ×3 IMPLANT
COVER SURGICAL LIGHT HANDLE (MISCELLANEOUS) ×3 IMPLANT
DERMABOND ADVANCED (GAUZE/BANDAGES/DRESSINGS) ×2
DERMABOND ADVANCED .7 DNX12 (GAUZE/BANDAGES/DRESSINGS) ×1 IMPLANT
DEVICE DUBIN SPECIMEN MAMMOGRA (MISCELLANEOUS) ×3 IMPLANT
DRAPE CHEST BREAST 15X10 FENES (DRAPES) ×3 IMPLANT
DRAPE UTILITY XL STRL (DRAPES) ×3 IMPLANT
ELECT COATED BLADE 2.86 ST (ELECTRODE) ×3 IMPLANT
ELECT REM PT RETURN 9FT ADLT (ELECTROSURGICAL) ×3
ELECTRODE REM PT RTRN 9FT ADLT (ELECTROSURGICAL) ×1 IMPLANT
GLOVE BIO SURGEON STRL SZ7 (GLOVE) ×6 IMPLANT
GLOVE BIOGEL PI IND STRL 7.5 (GLOVE) ×1 IMPLANT
GLOVE BIOGEL PI INDICATOR 7.5 (GLOVE) ×2
GOWN STRL REUS W/ TWL LRG LVL3 (GOWN DISPOSABLE) ×2 IMPLANT
GOWN STRL REUS W/TWL LRG LVL3 (GOWN DISPOSABLE) ×6
ILLUMINATOR WAVEGUIDE N/F (MISCELLANEOUS) ×3 IMPLANT
KIT BASIN OR (CUSTOM PROCEDURE TRAY) ×3 IMPLANT
KIT MARKER MARGIN INK (KITS) ×3 IMPLANT
NDL HYPO 25GX1X1/2 BEV (NEEDLE) ×1 IMPLANT
NEEDLE HYPO 25GX1X1/2 BEV (NEEDLE) ×3 IMPLANT
NS IRRIG 1000ML POUR BTL (IV SOLUTION) ×3 IMPLANT
PACK SURGICAL SETUP 50X90 (CUSTOM PROCEDURE TRAY) ×3 IMPLANT
PENCIL BUTTON HOLSTER BLD 10FT (ELECTRODE) ×3 IMPLANT
SPONGE LAP 18X18 X RAY DECT (DISPOSABLE) ×3 IMPLANT
STRIP CLOSURE SKIN 1/2X4 (GAUZE/BANDAGES/DRESSINGS) ×2 IMPLANT
SUT MNCRL AB 4-0 PS2 18 (SUTURE) ×3 IMPLANT
SUT SILK 2 0 SH (SUTURE) ×2 IMPLANT
SUT VIC AB 2-0 SH 27 (SUTURE) ×3
SUT VIC AB 2-0 SH 27XBRD (SUTURE) ×1 IMPLANT
SUT VIC AB 3-0 SH 27 (SUTURE) ×3
SUT VIC AB 3-0 SH 27X BRD (SUTURE) ×1 IMPLANT
SYR BULB 3OZ (MISCELLANEOUS) ×3 IMPLANT
SYR CONTROL 10ML LL (SYRINGE) ×3 IMPLANT
TOWEL OR 17X24 6PK STRL BLUE (TOWEL DISPOSABLE) ×3 IMPLANT
TOWEL OR 17X26 10 PK STRL BLUE (TOWEL DISPOSABLE) ×3 IMPLANT
TUBE CONNECTING 12'X1/4 (SUCTIONS) ×1
TUBE CONNECTING 12X1/4 (SUCTIONS) ×2 IMPLANT
YANKAUER SUCT BULB TIP NO VENT (SUCTIONS) ×3 IMPLANT

## 2017-02-07 NOTE — Transfer of Care (Signed)
Immediate Anesthesia Transfer of Care Note  Patient: Caitlin Alexander  Procedure(s) Performed: BREAST LUMPECTOMY WITH RADIOACTIVE SEED LOCALIZATION (Right Breast)  Patient Location: PACU  Anesthesia Type:General  Level of Consciousness: awake, patient cooperative and responds to stimulation  Airway & Oxygen Therapy: Patient Spontanous Breathing and Patient connected to nasal cannula oxygen  Post-op Assessment: Report given to RN and Post -op Vital signs reviewed and stable  Post vital signs: Reviewed and stable  Last Vitals:  Vitals:   02/07/17 0559  BP: 117/62  Pulse: 61  Resp: 18  Temp: 36.7 C  SpO2: 98%    Last Pain:  Vitals:   02/07/17 0559  TempSrc: Oral      Patients Stated Pain Goal: 3 (53/61/44 3154)  Complications: No apparent anesthesia complications

## 2017-02-07 NOTE — Anesthesia Preprocedure Evaluation (Addendum)
Anesthesia Evaluation  Patient identified by MRN, date of birth, ID band Patient awake    History of Anesthesia Complications (+) PONV  Airway Mallampati: II       Dental   Pulmonary    breath sounds clear to auscultation       Cardiovascular negative cardio ROS   Rhythm:Regular Rate:Normal     Neuro/Psych    GI/Hepatic negative GI ROS, Neg liver ROS,   Endo/Other    Renal/GU negative Renal ROS     Musculoskeletal   Abdominal   Peds  Hematology   Anesthesia Other Findings   Reproductive/Obstetrics                             Anesthesia Physical Anesthesia Plan  ASA: III  Anesthesia Plan: General   Post-op Pain Management:    Induction: Intravenous  PONV Risk Score and Plan: 4 or greater and Treatment may vary due to age or medical condition  Airway Management Planned: Oral ETT  Additional Equipment:   Intra-op Plan:   Post-operative Plan: Extubation in OR  Informed Consent: I have reviewed the patients History and Physical, chart, labs and discussed the procedure including the risks, benefits and alternatives for the proposed anesthesia with the patient or authorized representative who has indicated his/her understanding and acceptance.   Dental advisory given  Plan Discussed with:   Anesthesia Plan Comments:        Anesthesia Quick Evaluation

## 2017-02-07 NOTE — Op Note (Signed)
Preoperative diagnosis: Rightbreast cancer, clinical stage 0 Postoperative diagnosis: same as above Procedure:Rightbreast seed guided lumpectomy Surgeon: Dr Serita Grammes [id number] Anes: general  Specimens  1.Rightbreast tissue marked with paint 2.additional right breast inferior margin marked short superior, long lateral, double deep Complications none Drains none Sponge count correct Dispo to pacu stable  Indications: This is a3yof with a newly diagnosed clinical stage 0right breast cancer. We discussed options and have elected to proceed with seed guided lumpectomy.  Procedure: After informed consent was obtained the patient was taken to the operating room.She was given antibiotics. Sequential compression devices were on her legs. She was then placed under general anesthesia with an LMA. Then she was prepped and draped in the standard sterile surgical fashion. Surgical timeout was then performed. I then located the seed in theupper central right breastI infiltrated marcaine in the skin and then made and incision overlying the tumor as it appeared to be somewhat close to the skin. I then used the neoprobe to remove the seed and the surrounding tissue with attempt to get clear margins. I marked this with paint. MM confirmed removal of seed and theclip. The inferior margin was close with counts and I did excise more inferior margin which encompassed the medial and lateral margins as well. I placed clips in the cavity.I then obtained hemostasis. This was marked as above.I approximated the breast tissue with 2-0 vicryl. The skin was closed with 3-0 vicryl and 4-0 monocryl. Glue and steristrips were applied.

## 2017-02-07 NOTE — Discharge Instructions (Signed)
[facility] °Office Phone Number [phone] °POST OP INSTRUCTIONS ° °Always review your discharge instruction sheet given to you by the facility where your surgery was performed. ° °IF YOU HAVE DISABILITY OR FAMILY LEAVE FORMS, YOU MUST BRING THEM TO THE OFFICE FOR PROCESSING.  DO NOT GIVE THEM TO YOUR DOCTOR. ° °1. A prescription for pain medication may be given to you upon discharge.  Take your pain medication as prescribed, if needed.  If narcotic pain medicine is not needed, then you may take acetaminophen (Tylenol), naprosyn (Alleve) or ibuprofen (Advil) as needed. °2. Take your usually prescribed medications unless otherwise directed °3. If you need a refill on your pain medication, please contact your pharmacy.  They will contact our office to request authorization.  Prescriptions will not be filled after 5pm or on week-ends. °4. You should eat very light the first 24 hours after surgery, such as soup, crackers, pudding, etc.  Resume your normal diet the day after surgery. °5. Most patients will experience some swelling and bruising in the breast.  Ice packs and a good support bra will help.  Wear the breast binder provided or a sports bra for 72 hours day and night.  After that wear a sports bra during the day until you return to the office. Swelling and bruising can take several days to resolve.  °6. It is common to experience some constipation if taking pain medication after surgery.  Increasing fluid intake and taking a stool softener will usually help or prevent this problem from occurring.  A mild laxative (Milk of Magnesia or Miralax) should be taken according to package directions if there are no bowel movements after 48 hours. °7. Unless discharge instructions indicate otherwise, you may remove your bandages 48 hours after surgery and you may shower at that time.  You may have steri-strips (small skin tapes) in place directly over the incision.  These strips should be left on the  skin for 7-10 days and will come off on their own.  If your surgeon used skin glue on the incision, you may shower in 24 hours.  The glue will flake off over the next 2-3 weeks.  Any sutures or staples will be removed at the office during your follow-up visit. °8. ACTIVITIES:  You may resume regular daily activities (gradually increasing) beginning the next day.  Wearing a good support bra or sports bra minimizes pain and swelling.  You may have sexual intercourse when it is comfortable. °a. You may drive when you no longer are taking prescription pain medication, you can comfortably wear a seatbelt, and you can safely maneuver your car and apply brakes. °b. RETURN TO WORK:  ______________________________________________________________________________________ °9. You should see your doctor in the office for a follow-up appointment approximately two weeks after your surgery.  Your doctor’s nurse will typically make your follow-up appointment when she calls you with your pathology report.  Expect your pathology report 3-4 business days after your surgery.  You may call to check if you do not hear from us after three days. °10. OTHER INSTRUCTIONS: _______________________________________________________________________________________________ _____________________________________________________________________________________________________________________________________ °_____________________________________________________________________________________________________________________________________ °_____________________________________________________________________________________________________________________________________ ° °WHEN TO CALL DR Madsen Riddle: °1. Fever over 101.0 °2. Nausea and/or vomiting. °3. Extreme swelling or bruising. °4. Continued bleeding from incision. °5. Increased pain, redness, or drainage from the incision. ° °The clinic staff is available to answer your questions during regular  business hours.  Please don’t hesitate to call and ask to speak to one of the nurses for clinical concerns.  If you   have a medical emergency, go to the nearest emergency room or call 911.  A surgeon from [facility] is always on call at the hospital. ° °For further questions, please visit [email or web address] ° °

## 2017-02-07 NOTE — Interval H&P Note (Signed)
History and Physical Interval Note:  02/07/2017 7:19 AM  Caitlin Alexander  has presented today for surgery, with the diagnosis of RIGHT BREAST CANCER  The various methods of treatment have been discussed with the patient and family. After consideration of risks, benefits and other options for treatment, the patient has consented to  Procedure(s): BREAST LUMPECTOMY WITH RADIOACTIVE SEED LOCALIZATION (Right) as a surgical intervention .  The patient's history has been reviewed, patient examined, no change in status, stable for surgery.  I have reviewed the patient's chart and labs.  Questions were answered to the patient's satisfaction.     [name]

## 2017-02-07 NOTE — Anesthesia Postprocedure Evaluation (Signed)
Anesthesia Post Note  Patient: Caitlin Alexander  Procedure(s) Performed: BREAST LUMPECTOMY WITH RADIOACTIVE SEED LOCALIZATION (Right Breast)     Patient location during evaluation: PACU Anesthesia Type: General Level of consciousness: awake Pain management: pain level controlled Vital Signs Assessment: post-procedure vital signs reviewed and stable Respiratory status: spontaneous breathing Cardiovascular status: stable Anesthetic complications: no    Last Vitals:  Vitals:   02/07/17 0849 02/07/17 0855  BP: 121/66   Pulse: 65   Resp: 16   Temp:    SpO2: 96% 96%    Last Pain:  Vitals:   02/07/17 0559  TempSrc: Oral                 Yandell Mcjunkins

## 2017-02-07 NOTE — H&P (Signed)
77 yof referred by Dr Asencion Noble for new right breast dcis. she has no prior breast history and no family history of breast cancer. she had no mass or dc. underwent screening mm that shows d density breasts. she has right breast uoq calcifications. these measure about 2.5 cm on diagnostic views. core biopsy was done that shows hg dcis that is 100% er pos/70% positive. she is here with her daughter in law to discuss options  Past Surgical History (Tanisha A. Owens Shark, [place]; 01/27/2017 9:27 AM) Anal Fissure Repair  Appendectomy  Breast Biopsy  Right. Hysterectomy (not due to cancer) - Complete  Pancreas Surgery  Resection of Small Bowel   Diagnostic Studies History (Tanisha A. Owens Shark, [place]; 01/27/2017 9:27 AM) Colonoscopy  1-5 years ago Mammogram  within last year  Allergies (Tanisha A. Owens Shark, [place]; 01/27/2017 9:30 AM) Sulfa Antibiotics  Phenergan *ANTIHISTAMINES*  Allergies Reconciled   Medication History (Tanisha A. Owens Shark, [place]; 01/27/2017 9:30 AM) LORazepam (1MG  Tablet, Oral) Active. Lovastatin (20MG  Tablet, Oral) Active. Medications Reconciled  Social History (Tanisha A. Owens Shark, [place]; 01/27/2017 9:27 AM) Caffeine use  Carbonated beverages, Coffee, Tea. No alcohol use  No drug use  Tobacco use  Never smoker.  Family History (Tanisha A. Owens Shark, [place]; 01/27/2017 9:27 AM) Diabetes Mellitus  Brother. Hypertension  Mother, Sister, Pandora Leiter.  Pregnancy / Birth History (Tanisha A. Owens Shark, [place]; 01/27/2017 9:27 AM) Age at menarche  81 years. Age of menopause  <47 Gravida  1 Maternal age  52-20 Para  1  Other Problems (Tanisha A. Owens Shark, [place]; 01/27/2017 9:27 AM) Arthritis  Back Pain  Breast Cancer  Depression  Hemorrhoids  Hypercholesterolemia  Oophorectomy  Right. Thyroid Disease    Review of Systems (Tanisha A. Brown RMA; 01/27/2017 9:27 AM) General Present- Fatigue. Not Present- Appetite Loss, Chills, Fever, Night Sweats, Weight Gain and Weight Loss. Skin  Not Present- Change in Wart/Mole, Dryness, Hives, Jaundice, New Lesions, Non-Healing Wounds, Rash and Ulcer. HEENT Present- Ringing in the Ears and Wears glasses/contact lenses. Not Present- Earache, Hearing Loss, Hoarseness, Nose Bleed, Oral Ulcers, Seasonal Allergies, Sinus Pain, Sore Throat, Visual Disturbances and Yellow Eyes. Respiratory Present- Snoring. Not Present- Bloody sputum, Chronic Cough, Difficulty Breathing and Wheezing. Breast Not Present- Breast Mass, Breast Pain, Nipple Discharge and Skin Changes. Cardiovascular Present- Leg Cramps and Palpitations. Not Present- Chest Pain, Difficulty Breathing Lying Down, Rapid Heart Rate, Shortness of Breath and Swelling of Extremities. Gastrointestinal Present- Constipation, Hemorrhoids and Indigestion. Not Present- Abdominal Pain, Bloating, Bloody Stool, Change in Bowel Habits, Chronic diarrhea, Difficulty Swallowing, Excessive gas, Gets full quickly at meals, Nausea, Rectal Pain and Vomiting. Female Genitourinary Not Present- Frequency, Nocturia, Painful Urination, Pelvic Pain and Urgency. Musculoskeletal Present- Back Pain. Not Present- Joint Pain, Joint Stiffness, Muscle Pain, Muscle Weakness and Swelling of Extremities. Neurological Not Present- Decreased Memory, Fainting, Headaches, Numbness, Seizures, Tingling, Tremor, Trouble walking and Weakness. Psychiatric Present- Change in Sleep Pattern and Depression. Not Present- Anxiety, Bipolar, Fearful and Frequent crying. Endocrine Present- Hair Changes. Not Present- Cold Intolerance, Excessive Hunger, Heat Intolerance, Hot flashes and New Diabetes. Hematology Not Present- Blood Thinners, Easy Bruising, Excessive bleeding, Gland problems, HIV and Persistent Infections.  Vitals (Tanisha A. Brown RMA; 01/27/2017 9:29 AM) 01/27/2017 9:29 AM Weight: 128.8 lb Height: 63in Body Surface Area: 1.6 m Body Mass Index: 22.82 kg/m  Temp.: 97.65F  Pulse: 73 (Regular)  BP: 116/74 (Sitting,  Left Arm, Standard) Physical Exam Rolm Bookbinder MD; 01/27/2017 10:13 AM) General Mental Status-Alert. Orientation-Oriented X3. Head and Neck Trachea-midline. Thyroid Gland  Characteristics - normal size and consistency. Eye Sclera/Conjunctiva - Bilateral-No scleral icterus. Chest and Lung Exam Chest and lung exam reveals -quiet, even and easy respiratory effort with no use of accessory muscles and on auscultation, normal breath sounds, no adventitious sounds and normal vocal resonance. Breast Nipples-No Discharge. Breast Lump-No Palpable Breast Mass. Cardiovascular Cardiovascular examination reveals -normal heart sounds, regular rate and rhythm with no murmurs. Lymphatic Head & Neck General Head & Neck Lymphatics: Bilateral - Description - Normal. Axillary General Axillary Region: Bilateral - Description - Normal. 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 We discussed all of the different options for treatment for breast cancer including surgery, chemotherapy, radiation therapy, Herceptin, and antiestrogen therapy. We discussed that I do not think she needs a sentinel node biopsy. We discussed the options for treatment of the breast cancer which included lumpectomy versus a mastectomy. We discussed the performance of the lumpectomy with radioactive seed placement. We discussed a 5-10% chance of a positive margin requiring reexcision in the operating room. We also discussed that she might need radiation therapy if she undergoes lumpectomy. We discussed the mastectomy and the postoperative care for that as well. Mastectomy can be followed by reconstruction. This is a more extensive surgery and requires more recovery. The decision for lumpectomy vs mastectomy has no  impact on decision for chemotherapy. Most mastectomy patients will not need radiation therapy. We discussed that there is no difference in her survival whether she undergoes lumpectomy with radiation therapy or antiestrogen therapy versus a mastectomy. There is also no real difference between her recurrence in the breast. We discussed the risks of operation including bleeding, infection, possible reoperation. She understands her further therapy will be based on what her stages at the time of her operation. will refer to med oncology after surgery

## 2017-02-08 ENCOUNTER — Encounter (HOSPITAL_COMMUNITY): Payer: Self-pay | Admitting: General Surgery

## 2017-07-26 ENCOUNTER — Encounter: Payer: Self-pay | Admitting: Radiation Oncology

## 2017-08-03 ENCOUNTER — Ambulatory Visit: Payer: Medicare Other | Admitting: Radiation Oncology

## 2017-08-03 ENCOUNTER — Ambulatory Visit: Payer: Medicare Other

## 2017-08-07 ENCOUNTER — Ambulatory Visit (HOSPITAL_COMMUNITY): Payer: Medicare Other | Admitting: Hematology

## 2017-08-29 ENCOUNTER — Inpatient Hospital Stay (HOSPITAL_COMMUNITY): Payer: Medicare Other | Attending: Hematology | Admitting: Hematology

## 2017-08-29 ENCOUNTER — Other Ambulatory Visit: Payer: Self-pay

## 2017-08-29 ENCOUNTER — Encounter (HOSPITAL_COMMUNITY): Payer: Self-pay | Admitting: Hematology

## 2017-08-29 VITALS — BP 140/63 | HR 62 | Temp 98.1°F | Resp 16 | Wt 131.4 lb

## 2017-08-29 DIAGNOSIS — M810 Age-related osteoporosis without current pathological fracture: Secondary | ICD-10-CM | POA: Diagnosis not present

## 2017-08-29 DIAGNOSIS — Z13820 Encounter for screening for osteoporosis: Secondary | ICD-10-CM

## 2017-08-29 DIAGNOSIS — Z170 Estrogen receptor positive status [ER+]: Secondary | ICD-10-CM | POA: Insufficient documentation

## 2017-08-29 DIAGNOSIS — Z79899 Other long term (current) drug therapy: Secondary | ICD-10-CM | POA: Insufficient documentation

## 2017-08-29 DIAGNOSIS — F419 Anxiety disorder, unspecified: Secondary | ICD-10-CM | POA: Diagnosis not present

## 2017-08-29 DIAGNOSIS — D0511 Intraductal carcinoma in situ of right breast: Secondary | ICD-10-CM | POA: Insufficient documentation

## 2017-08-29 DIAGNOSIS — E785 Hyperlipidemia, unspecified: Secondary | ICD-10-CM | POA: Insufficient documentation

## 2017-08-29 NOTE — Assessment & Plan Note (Signed)
1.  High-grade DCIS: - Screening mammogram in September 2018 was abnormal.  Additional views showed BI-RADS category lesion. -On 01/05/2017 biopsy of the right breast upper outer quadrant shows DCIS, ER 100% positive and PR 70% positive. -On 02/07/2017 she underwent right lumpectomy and reexcision of positive posterior and inferior margin -Pathology showed 0.7 cm, high-grade DCIS, close inferior margin.  I have discussed the pathology report with the patient and her friend in detail. -Patient was seen by Dr. Donne Hazel in December 2018 and various options including reexcision and radiation therapy were discussed. - Patient is reluctant to consider radiation therapy.  She has an appointment both at [name] and [facility]. - She is willing to take a pill as long as it is not chemotherapy.  I have recommended 5 years of tamoxifen/AI to decrease chances of noninvasive and invasive breast cancers, involving both breasts.  She is agreeable to this option.  We will obtain baseline bone density test.  Depending on her bone density results, we will choose the appropriate agent.  She will come back after the DEXA scan.

## 2017-08-29 NOTE — Progress Notes (Signed)
[facility] NOTE  Patient Care Team: Asencion Noble, MD as PCP - General (Internal Medicine)  CHIEF COMPLAINTS/PURPOSE OF CONSULTATION:  Newly diagnosed right breast DCIS.  HISTORY OF PRESENTING ILLNESS:  Caitlin Alexander 82 y.o. female is here because of recent diagnosis of right breast DCIS.  She had a screening mammogram done on 12/22/2016 which was BI-RADS Category 0.  Additional views on 12/27/2016 shows right upper outer quadrant mass, BI-RADS Category 4 with recommendation for biopsy.  She underwent biopsy on 01/05/2017 which showed DCIS with ER 100% positive and PR 70% positive.  On 02/07/2017 she underwent right lumpectomy by Dr. Donne Hazel.  As the posterior and inferior margins were close, she underwent additional reexcision.  0.7 cm high-grade DCIS.  Final pathology showed carcinoma within 0.1 cm from the new inferior margin.  She has seen Dr. Willey Blade recently and was referred to [place] in radiation oncology.  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 Eugenia Pancoast at [facility].  She also had some form of abdominal surgery in 5885 and had complications from it. She is widowed and lives by herself at home.  She is able to do all her ADLs and IADLs.  She even mows her lawn.  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 I reviewed her records extensively and collaborated the history with the patient.   In terms of breast cancer risk profile:  She attained menarche at age 44. She attained menopause at age of 57 when she underwent complete hysterectomy for fibroids. She thinks she was on "several years of hormone pills" after hysterectomy. She has 1 child and her age at first childbirth was 31. Denies any previous breast biopsies. No family history of breast or ovarian malignancies.  MEDICAL HISTORY:  Past Medical History:  Diagnosis Date  . Anxiety   . Arthritis   . Cancer [facility])    breast cancer  . Hyperlipidemia   . Osteoporosis    . PONV (postoperative nausea and vomiting)     SURGICAL HISTORY: Past Surgical History:  Procedure Laterality Date  . ABDOMINAL HYSTERECTOMY    . ABDOMINAL SURGERY    . APPENDECTOMY    . BOWEL RESECTION  10/31/2011   Procedure: SMALL BOWEL RESECTION;  Surgeon: Donato Heinz, MD;  Location: [facility];  Service: General;;  . BREAST LUMPECTOMY WITH RADIOACTIVE SEED LOCALIZATION Right 02/07/2017   Procedure: BREAST LUMPECTOMY WITH RADIOACTIVE SEED LOCALIZATION;  Surgeon: Rolm Bookbinder, MD;  Location: [place];  Service: General;  Laterality: Right;  . FISSURECTOMY    . 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 LAPAROTOMY  10/31/2011   Procedure: EXPLORATORY LAPAROTOMY;  Surgeon: Donato Heinz, MD;  Location: [facility];  Service: General;  Laterality: N/A;  . NASAL SEPTUM SURGERY  1990  . NASAL SINUS SURGERY    . TUBAL LIGATION      SOCIAL HISTORY: Social History   Socioeconomic History  . Marital status: Widowed    Spouse name: Not on file  . Number of children: Not on file  . Years of education: Not on file  . Highest education level: Not on file  Occupational History  . Not on file  Social Needs  . Financial resource strain: Not on file  . Food insecurity:    Worry: Not on file    Inability: Not on file  . Transportation needs:    Medical: Not on file  Non-medical: Not on file  Tobacco Use  . Smoking status: Never Smoker  . Smokeless tobacco: Never Used  Substance and Sexual Activity  . Alcohol use: No  . Drug use: No  . Sexual activity: Never  Lifestyle  . Physical activity:    Days per week: Not on file    Minutes per session: Not on file  . Stress: Not on file  Relationships  . Social connections:    Talks on phone: Not on file    Gets together: Not on file    Attends religious service: Not on file    Active member of club or organization: Not on file    Attends meetings of clubs or organizations: Not on file    Relationship status:  Not on file  . Intimate partner violence:    Fear of current or ex partner: Not on file    Emotionally abused: Not on file    Physically abused: Not on file    Forced sexual activity: Not on file  Other Topics Concern  . Not on file  Social History Narrative  . Not on file    FAMILY HISTORY: Family History  Problem Relation Age of Onset  . Diabetes Brother   . Hypertension Son   . Ulcers Sister     ALLERGIES:  is allergic to macrodantin [nitrofurantoin macrocrystal]; promethazine; sulfa antibiotics; and suprax [cefixime].  MEDICATIONS:  Current Outpatient Medications  Medication Sig Dispense Refill  . acetaminophen (TYLENOL) 500 MG tablet Take 500 mg by mouth every 6 (six) hours as needed for moderate pain or headache.     . Carboxymethylcellulose Sodium (THERATEARS OP) Place 2 drops into both eyes as needed (for dry eyes).    . LORazepam (ATIVAN) 1 MG tablet Take 1 mg by mouth 3 (three) times daily.     Marland Kitchen lovastatin (MEVACOR) 20 MG tablet Take 20 mg by mouth daily.  11  . polyethylene glycol (MIRALAX / GLYCOLAX) packet Take 17 g by mouth every morning.     No current facility-administered medications for this visit.     REVIEW OF SYSTEMS:   Constitutional: Denies fevers, chills or abnormal night sweats Eyes: Denies blurriness of vision, double vision or watery eyes Ears, nose, mouth, throat, and face: Denies mucositis or sore throat Respiratory: Denies cough, dyspnea or wheezes Cardiovascular: Denies palpitation, chest discomfort or lower extremity swelling Gastrointestinal:  Denies nausea, heartburn or change in bowel habits Skin: Denies abnormal skin rashes Lymphatics: Denies new lymphadenopathy or easy bruising Neurological:Denies numbness, tingling or new weaknesses Behavioral/Psych: Mood is stable, no new changes  Breast:  Denies any palpable lumps or discharge All other systems were reviewed with the patient and are negative.  PHYSICAL EXAMINATION: ECOG  PERFORMANCE STATUS: 1 - Symptomatic but completely ambulatory  Vitals:   08/29/17 1328  BP: 140/63  Pulse: 62  Resp: 16  Temp: 98.1 F (36.7 C)  SpO2: 98%   Filed Weights   08/29/17 1328  Weight: 131 lb 6.4 oz (59.6 kg)    GENERAL:alert, no distress and comfortable SKIN: skin color, texture, turgor are normal, no rashes or significant lesions EYES: normal, conjunctiva are pink and non-injected, sclera clear OROPHARYNX:no exudate, no erythema and lips, buccal mucosa, and tongue normal  NECK: supple, thyroid normal size, non-tender, without nodularity LYMPH:  no palpable lymphadenopathy in the cervical, axillary or inguinal LUNGS: clear to auscultation and percussion with normal breathing effort HEART: regular rate & rhythm and no murmurs and no lower extremity edema ABDOMEN:abdomen  soft, non-tender and normal bowel sounds.  Scarring from previous surgery present. Musculoskeletal:no cyanosis of digits and no clubbing  PSYCH: alert & oriented x 3 with fluent speech  BREAST: Right breast upper outer quadrant lumpectomy scar is well-healed.  No palpable masses in bilateral breast.  No palpable axillary or supraclavicular adenopathy.  Patient was examined in the presence of our nurse.  LABORATORY DATA:  I have reviewed the data as listed Lab Results  Component Value Date   WBC 11.0 (H) 02/01/2017   HGB 15.1 (H) 02/01/2017   HCT 44.3 02/01/2017   MCV 93.1 02/01/2017   PLT 341 02/01/2017   Lab Results  Component Value Date   NA 138 02/01/2017   K 4.7 02/01/2017   CL 103 02/01/2017   CO2 26 02/01/2017    RADIOGRAPHIC STUDIES: I have personally reviewed her previous mammogram and pathology reports.  ASSESSMENT AND PLAN:  Ductal carcinoma in situ (DCIS) of right breast 1.  High-grade DCIS: - Screening mammogram in September 2018 was abnormal.  Additional views showed BI-RADS category lesion. -On 01/05/2017 biopsy of the right breast upper outer quadrant shows DCIS, ER 100%  positive and PR 70% positive. -On 02/07/2017 she underwent right lumpectomy and reexcision of positive posterior and inferior margin -Pathology showed 0.7 cm, high-grade DCIS, close inferior margin.  I have discussed the pathology report with the patient and her friend in detail. -Patient was seen by Dr. Donne Hazel in December 2018 and various options including reexcision and radiation therapy were discussed. - Patient is reluctant to consider radiation therapy.  She has an appointment both at [facility] long and [facility]. - She is willing to take a pill as long as it is not chemotherapy.  I have recommended 5 years of tamoxifen/AI to decrease chances of noninvasive and invasive breast cancers, involving both breasts.  She is agreeable to this option.  We will obtain baseline bone density test.  Depending on her bone density results, we will choose the appropriate agent.  She will come back after the DEXA scan.   All questions were answered. The patient knows to call the clinic with any problems, questions or concerns. Total time spent is 45 minutes with more than 50% of the time spent face-to-face discussing her diagnosis, pathology report, and further treatment recommendations.  Derek Jack, MD 08/29/17

## 2017-09-05 ENCOUNTER — Telehealth (HOSPITAL_COMMUNITY): Payer: Self-pay | Admitting: General Practice

## 2017-09-05 NOTE — Telephone Encounter (Signed)
[facility] Progress Notes  Call to new patient to assess for needs/resources; now answer.  Left VM requesting call back if desired.  Edwyna Shell, LCSW Clinical Social Worker Phone:  [phone]

## 2017-09-21 ENCOUNTER — Ambulatory Visit (HOSPITAL_COMMUNITY)
Admission: RE | Admit: 2017-09-21 | Discharge: 2017-09-21 | Disposition: A | Discharge status: Home / Self Care | Payer: Medicare Other | Source: Ambulatory Visit | Attending: Hematology | Admitting: Hematology

## 2017-09-21 DIAGNOSIS — M810 Age-related osteoporosis without current pathological fracture: Secondary | ICD-10-CM | POA: Insufficient documentation

## 2017-09-21 DIAGNOSIS — Z13820 Encounter for screening for osteoporosis: Secondary | ICD-10-CM | POA: Diagnosis present

## 2017-09-27 ENCOUNTER — Inpatient Hospital Stay (HOSPITAL_COMMUNITY): Payer: Medicare Other | Attending: Hematology | Admitting: Hematology

## 2017-09-27 ENCOUNTER — Encounter (HOSPITAL_COMMUNITY): Payer: Self-pay | Admitting: Hematology

## 2017-09-27 VITALS — BP 142/71 | HR 71 | Temp 98.3°F | Resp 14 | Wt 132.0 lb

## 2017-09-27 DIAGNOSIS — D0511 Intraductal carcinoma in situ of right breast: Secondary | ICD-10-CM | POA: Diagnosis not present

## 2017-09-27 DIAGNOSIS — F419 Anxiety disorder, unspecified: Secondary | ICD-10-CM | POA: Diagnosis not present

## 2017-09-27 DIAGNOSIS — Z79810 Long term (current) use of selective estrogen receptor modulators (SERMs): Secondary | ICD-10-CM | POA: Diagnosis not present

## 2017-09-27 DIAGNOSIS — Z170 Estrogen receptor positive status [ER+]: Secondary | ICD-10-CM | POA: Diagnosis not present

## 2017-09-27 DIAGNOSIS — Z79899 Other long term (current) drug therapy: Secondary | ICD-10-CM | POA: Insufficient documentation

## 2017-09-27 DIAGNOSIS — E785 Hyperlipidemia, unspecified: Secondary | ICD-10-CM | POA: Insufficient documentation

## 2017-09-27 DIAGNOSIS — M129 Arthropathy, unspecified: Secondary | ICD-10-CM | POA: Insufficient documentation

## 2017-09-27 DIAGNOSIS — M810 Age-related osteoporosis without current pathological fracture: Secondary | ICD-10-CM | POA: Insufficient documentation

## 2017-09-27 NOTE — Assessment & Plan Note (Signed)
1.  High-grade DCIS: - Screening mammogram in September 2018 was abnormal.  Additional views showed BI-RADS category lesion. -On 01/05/2017 biopsy of the right breast upper outer quadrant shows DCIS, ER 100% positive and PR 70% positive. -On 02/07/2017 she underwent right lumpectomy and reexcision of positive posterior and inferior margin -Pathology showed 0.7 cm, high-grade DCIS, close inferior margin.  I have discussed the pathology report with the patient and her friend in detail. -Patient was seen by Dr. Donne Hazel in December 2018 and various options including reexcision and radiation therapy were discussed. - Patient is reluctant to consider radiation therapy.  - I have recommended tamoxifen 20 mg daily with or without food, for 5 years.  We discussed the various side effects of tamoxifen.  She is willing to give it a try.  2.  Osteoporosis: -Bone density test on 09/21/2017 shows a T score of -3.2 in the right femoral neck. -I have recommended Prolia 60 mg every 6 months.  We talked about the side effects in detail.  She does not want any shots.  We also talked about Fosamax taken weekly.  After hearing the side effects, she does not want to take the pill. -I have recommended her to take calcium plus vitamin D twice daily.

## 2017-09-27 NOTE — Patient Instructions (Signed)
[facility] Discharge Instructions  You saw Dr. Katragadda today.   Thank you for choosing [facility] at [facility] to provide your oncology and hematology care.  To afford each patient quality time with our provider, please arrive at least 15 minutes before your scheduled appointment time.   If you have a lab appointment with the Cancer Center please come in thru the  Main Entrance and check in at the main information desk  You need to re-schedule your appointment should you arrive 10 or more minutes late.  We strive to give you quality time with our providers, and arriving late affects you and other patients whose appointments are after yours.  Also, if you no show three or more times for appointments you may be dismissed from the clinic at the providers discretion.     Again, thank you for choosing [facility].  Our hope is that these requests will decrease the amount of time that you wait before being seen by our physicians.       _____________________________________________________________  Should you have questions after your visit to [facility], please contact our office at [phone] between the hours of 8:30 a.m. and 4:30 p.m.  Voicemails left after 4:30 p.m. will not be returned until the following business day.  For prescription refill requests, have your pharmacy contact our office.       Resources For Cancer Patients and their Caregivers ? American Cancer Society: Can assist with transportation, wigs, general needs, runs Look Good Feel Better.        1-888-227-6333 ? Cancer Care: Provides financial assistance, online support groups, medication/co-pay assistance.  1-800-813-HOPE (4673) ? [facility] Assists [place] cancer patients and their families through emotional , educational and financial support.  336-427-4357 ? [place] DSS Where to apply for  food stamps, Medicaid and utility assistance. 336-342-1394 ? [facility]: Transportation to medical appointments. 336-347-2287 ? Social Security Administration: May apply for disability if have a Stage IV cancer. 336-342-7796 1-800-772-1213 ? [place] Aging, Disability and Transit Services: Assists with nutrition, care and transit needs. 336-349-2343  Cancer Center Support Programs:   > Cancer Support Group  2nd Tuesday of the month 1pm-2pm, Journey Room   > Creative Journey  3rd Tuesday of the month 1130am-1pm, Journey Room     

## 2017-09-27 NOTE — Progress Notes (Signed)
[place], [place] [address]   CLINIC:  Medical Oncology/Hematology  PCP:  Asencion Noble, MD [address] [place] [address] [phone]   REASON FOR VISIT:  Follow-up for DCIS and DEXA scan results.  CURRENT THERAPY: Tamoxifen 20 mg daily.  INTERVAL HISTORY:  Caitlin Alexander 82 y.o. female returns for follow-up of DEXA scan results.  She had declined radiation therapy for DCIS.  She is fully functional at home.  Energy levels are 50%.  Denies any fevers, night sweats or weight loss.  Denies any hospitalizations.    REVIEW OF SYSTEMS:  Review of Systems  Constitutional: Positive for fatigue.  All other systems reviewed and are negative.    PAST MEDICAL/SURGICAL HISTORY:  Past Medical History:  Diagnosis Date  . Anxiety   . Arthritis   . Cancer [facility])    breast cancer  . Hyperlipidemia   . Osteoporosis   . PONV (postoperative nausea and vomiting)    Past Surgical History:  Procedure Laterality Date  . ABDOMINAL HYSTERECTOMY    . ABDOMINAL SURGERY    . APPENDECTOMY    . BOWEL RESECTION  10/31/2011   Procedure: SMALL BOWEL RESECTION;  Surgeon: Donato Heinz, MD;  Location: [facility];  Service: General;;  . BREAST LUMPECTOMY WITH RADIOACTIVE SEED LOCALIZATION Right 02/07/2017   Procedure: BREAST LUMPECTOMY WITH RADIOACTIVE SEED LOCALIZATION;  Surgeon: Rolm Bookbinder, MD;  Location: [facility];  Service: General;  Laterality: Right;  . FISSURECTOMY    . 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 LAPAROTOMY  10/31/2011   Procedure: EXPLORATORY LAPAROTOMY;  Surgeon: Donato Heinz, MD;  Location: [facility];  Service: General;  Laterality: N/A;  . NASAL SEPTUM SURGERY  1990  . NASAL SINUS SURGERY    . TUBAL LIGATION       SOCIAL HISTORY:  Social History   Socioeconomic History  . Marital status: Widowed    Spouse name: Not on file  . Number of children: Not on file  . Years of education: Not on file    . Highest education level: Not on file  Occupational History  . Not on file  Social Needs  . Financial resource strain: Not on file  . Food insecurity:    Worry: Not on file    Inability: Not on file  . Transportation needs:    Medical: Not on file    Non-medical: Not on file  Tobacco Use  . Smoking status: Never Smoker  . Smokeless tobacco: Never Used  Substance and Sexual Activity  . Alcohol use: No  . Drug use: No  . Sexual activity: Never  Lifestyle  . Physical activity:    Days per week: Not on file    Minutes per session: Not on file  . Stress: Not on file  Relationships  . Social connections:    Talks on phone: Not on file    Gets together: Not on file    Attends religious service: Not on file    Active member of club or organization: Not on file    Attends meetings of clubs or organizations: Not on file    Relationship status: Not on file  . Intimate partner violence:    Fear of current or ex partner: Not on file    Emotionally abused: Not on file    Physically abused: Not on file    Forced sexual activity: Not on file  Other Topics Concern  .  Not on file  Social History Narrative  . Not on file    FAMILY HISTORY:  Family History  Problem Relation Age of Onset  . Diabetes Brother   . Hypertension Son   . Ulcers Sister     CURRENT MEDICATIONS:  Outpatient Encounter Medications as of 09/27/2017  Medication Sig  . acetaminophen (TYLENOL) 500 MG tablet Take 500 mg by mouth every 6 (six) hours as needed for moderate pain or headache.   . Carboxymethylcellulose Sodium (THERATEARS OP) Place 2 drops into both eyes as needed (for dry eyes).  . LORazepam (ATIVAN) 1 MG tablet Take 1 mg by mouth 3 (three) times daily.   Marland Kitchen lovastatin (MEVACOR) 20 MG tablet Take 20 mg by mouth daily.  . polyethylene glycol (MIRALAX / GLYCOLAX) packet Take 17 g by mouth every morning.   No facility-administered encounter medications on file as of 09/27/2017.     ALLERGIES:   Allergies  Allergen Reactions  . Macrodantin [Nitrofurantoin Macrocrystal]     UNSPECIFIED REACTION   . Promethazine     UNSPECIFIED REACTION   . Sulfa Antibiotics     UNSPECIFIED REACTION   . Suprax [Cefixime]     UNSPECIFIED REACTION      PHYSICAL EXAM:  ECOG Performance status: 1  Vitals:   09/27/17 1542  BP: (!) 142/71  Pulse: 71  Resp: 14  Temp: 98.3 F (36.8 C)  SpO2: 97%   Filed Weights   09/27/17 1542  Weight: 132 lb (59.9 kg)    Physical Exam Deferred.  LABORATORY DATA:  I have reviewed the labs as listed.  CBC    Component Value Date/Time   WBC 11.0 (H) 02/01/2017 1334   RBC 4.76 02/01/2017 1334   HGB 15.1 (H) 02/01/2017 1334   HCT 44.3 02/01/2017 1334   PLT 341 02/01/2017 1334   MCV 93.1 02/01/2017 1334   MCH 31.7 02/01/2017 1334   MCHC 34.1 02/01/2017 1334   RDW 13.3 02/01/2017 1334   LYMPHSABS 4.0 06/30/2015 1218   MONOABS 1.0 06/30/2015 1218   EOSABS 0.2 06/30/2015 1218   BASOSABS 0.1 06/30/2015 1218   CMP Latest Ref Rng & Units 02/01/2017 06/30/2015 11/19/2011  Glucose 65 - 99 mg/dL 89 102(H) 131(H)  BUN 6 - 20 mg/dL 8 8 8   Creatinine 0.44 - 1.00 mg/dL 0.79 0.72 0.65  Sodium 135 - 145 mmol/L 138 140 134(L)  Potassium 3.5 - 5.1 mmol/L 4.7 4.6 5.1  Chloride 101 - 111 mmol/L 103 103 95(L)  CO2 22 - 32 mmol/L 26 27 30   Calcium 8.9 - 10.3 mg/dL 9.5 9.2 9.9  Total Protein 6.5 - 8.1 g/dL - 7.2 -  Total Bilirubin 0.3 - 1.2 mg/dL - 1.1 -  Alkaline Phos 38 - 126 U/L - 65 -  AST 15 - 41 U/L - 27 -  ALT 14 - 54 U/L - 24 -       DIAGNOSTIC IMAGING:  I have reviewed DEXA scan results dated 09/21/2017 and discussed with the patient in detail.    ASSESSMENT & PLAN:   Ductal carcinoma in situ (DCIS) of right breast 1.  High-grade DCIS: - Screening mammogram in September 2018 was abnormal.  Additional views showed BI-RADS category lesion. -On 01/05/2017 biopsy of the right breast upper outer quadrant shows DCIS, ER 100% positive and PR 70%  positive. -On 02/07/2017 she underwent right lumpectomy and reexcision of positive posterior and inferior margin -Pathology showed 0.7 cm, high-grade DCIS, close inferior margin.  I have discussed  the pathology report with the patient and her friend in detail. -Patient was seen by Dr. Donne Hazel in December 2018 and various options including reexcision and radiation therapy were discussed. - Patient is reluctant to consider radiation therapy.  - I have recommended tamoxifen 20 mg daily with or without food, for 5 years.  We discussed the various side effects of tamoxifen.  She is willing to give it a try.  2.  Osteoporosis: -Bone density test on 09/21/2017 shows a T score of -3.2 in the right femoral neck. -I have recommended Prolia 60 mg every 6 months.  We talked about the side effects in detail.  She does not want any shots.  We also talked about Fosamax taken weekly.  After hearing the side effects, she does not want to take the pill. -I have recommended her to take calcium plus vitamin D twice daily.      Orders placed this encounter:  Orders Placed This Encounter  Procedures  . MM Digital Diagnostic Bilat  . CBC with Differential/Platelet  . Comprehensive metabolic panel  . Vitamin D 25 hydroxy      Derek Jack, MD [place] [phone]

## 2017-12-14 ENCOUNTER — Ambulatory Visit: Payer: Medicare Other | Admitting: Radiation Oncology

## 2017-12-15 ENCOUNTER — Ambulatory Visit: Payer: Medicare Other

## 2017-12-18 ENCOUNTER — Ambulatory Visit: Payer: Medicare Other

## 2017-12-19 ENCOUNTER — Other Ambulatory Visit (HOSPITAL_COMMUNITY): Payer: Self-pay | Admitting: Nurse Practitioner

## 2017-12-19 ENCOUNTER — Ambulatory Visit: Payer: Medicare Other

## 2017-12-19 DIAGNOSIS — D0511 Intraductal carcinoma in situ of right breast: Secondary | ICD-10-CM

## 2017-12-20 ENCOUNTER — Ambulatory Visit: Payer: Medicare Other

## 2017-12-21 ENCOUNTER — Ambulatory Visit: Payer: Medicare Other

## 2017-12-22 ENCOUNTER — Ambulatory Visit: Payer: Medicare Other

## 2017-12-25 ENCOUNTER — Ambulatory Visit: Payer: Medicare Other

## 2017-12-26 ENCOUNTER — Ambulatory Visit: Payer: Medicare Other

## 2017-12-27 ENCOUNTER — Ambulatory Visit: Payer: Medicare Other

## 2017-12-28 ENCOUNTER — Ambulatory Visit: Payer: Medicare Other

## 2017-12-29 ENCOUNTER — Ambulatory Visit: Payer: Medicare Other

## 2018-01-01 ENCOUNTER — Ambulatory Visit: Payer: Medicare Other

## 2018-01-02 ENCOUNTER — Ambulatory Visit (HOSPITAL_COMMUNITY): Payer: Medicare Other

## 2018-01-02 ENCOUNTER — Ambulatory Visit (HOSPITAL_COMMUNITY)
Admission: RE | Admit: 2018-01-02 | Discharge: 2018-01-02 | Disposition: A | Discharge status: Home / Self Care | Payer: Medicare Other | Source: Ambulatory Visit | Attending: Nurse Practitioner | Admitting: Nurse Practitioner

## 2018-01-02 ENCOUNTER — Encounter (HOSPITAL_COMMUNITY): Payer: Medicare Other

## 2018-01-02 ENCOUNTER — Encounter (HOSPITAL_COMMUNITY): Payer: Self-pay

## 2018-01-02 ENCOUNTER — Ambulatory Visit: Payer: Medicare Other

## 2018-01-02 ENCOUNTER — Ambulatory Visit (HOSPITAL_COMMUNITY): Admission: RE | Admit: 2018-01-02 | Payer: Medicare Other | Source: Ambulatory Visit

## 2018-01-02 DIAGNOSIS — D0511 Intraductal carcinoma in situ of right breast: Secondary | ICD-10-CM | POA: Insufficient documentation

## 2018-01-02 HISTORY — DX: Malignant neoplasm of unspecified site of unspecified female breast: C50.919

## 2018-01-03 ENCOUNTER — Ambulatory Visit: Payer: Medicare Other

## 2018-01-04 ENCOUNTER — Ambulatory Visit: Payer: Medicare Other

## 2018-01-05 ENCOUNTER — Other Ambulatory Visit: Payer: Self-pay | Admitting: Internal Medicine

## 2018-01-05 ENCOUNTER — Ambulatory Visit: Payer: Medicare Other

## 2018-01-05 DIAGNOSIS — R921 Mammographic calcification found on diagnostic imaging of breast: Secondary | ICD-10-CM

## 2018-01-08 ENCOUNTER — Ambulatory Visit: Payer: Medicare Other

## 2018-01-09 ENCOUNTER — Ambulatory Visit: Payer: Medicare Other

## 2018-01-10 ENCOUNTER — Ambulatory Visit: Payer: Medicare Other

## 2018-01-12 ENCOUNTER — Ambulatory Visit
Admission: RE | Admit: 2018-01-12 | Discharge: 2018-01-12 | Disposition: A | Discharge status: Home / Self Care | Payer: Medicare Other | Source: Ambulatory Visit | Attending: Internal Medicine | Admitting: Internal Medicine

## 2018-01-12 DIAGNOSIS — R921 Mammographic calcification found on diagnostic imaging of breast: Secondary | ICD-10-CM

## 2018-01-15 ENCOUNTER — Other Ambulatory Visit (HOSPITAL_COMMUNITY): Payer: Self-pay | Admitting: General Surgery

## 2018-01-15 DIAGNOSIS — D0511 Intraductal carcinoma in situ of right breast: Secondary | ICD-10-CM

## 2018-01-16 ENCOUNTER — Ambulatory Visit (HOSPITAL_COMMUNITY)
Admission: RE | Admit: 2018-01-16 | Discharge: 2018-01-16 | Disposition: A | Discharge status: Home / Self Care | Payer: Medicare Other | Source: Ambulatory Visit | Attending: General Surgery | Admitting: General Surgery

## 2018-01-16 DIAGNOSIS — D0511 Intraductal carcinoma in situ of right breast: Secondary | ICD-10-CM | POA: Insufficient documentation

## 2018-01-19 ENCOUNTER — Other Ambulatory Visit: Payer: Self-pay | Admitting: General Surgery

## 2018-02-13 ENCOUNTER — Other Ambulatory Visit (HOSPITAL_COMMUNITY): Payer: Medicare Other

## 2018-02-16 ENCOUNTER — Other Ambulatory Visit: Payer: Self-pay | Admitting: General Surgery

## 2018-02-16 DIAGNOSIS — D0511 Intraductal carcinoma in situ of right breast: Secondary | ICD-10-CM

## 2018-02-16 MED ORDER — CIPROFLOXACIN IN D5W 400 MG/200ML IV SOLN: 400.0000 mg | INTRAVENOUS | Status: AC

## 2018-02-16 MED ORDER — ACETAMINOPHEN 500 MG PO TABS: 1000.0000 mg | ORAL_TABLET | ORAL | Status: AC

## 2018-02-16 MED ORDER — GABAPENTIN 100 MG PO CAPS: 100.0000 mg | ORAL_CAPSULE | ORAL | Status: AC

## 2018-02-19 NOTE — Pre-Procedure Instructions (Signed)
Ronya Gilcrest Kief  02/19/2018      [facility] [place], [place] - [place] [facility] [place] [address] [place] [address] Phone: [phone] Fax: [phone]    Your procedure is scheduled on Tuesday December 3rd.  Report to [facility] Admitting at [place].M.  Call this number if you have problems the morning of surgery:  [phone]   Remember:  Do not eat after midnight.  You may drink clear liquids until 0430am . Clear liquids allowed are: Water, Juice (non-citric and without pulp), Carbonated beverages, Clear Tea, Black Coffee only and Gatorade  Please finish your Pre-Surgery Ensure by 4:30am     Take these medicines the morning of surgery with A SIP OF WATER   acetaminophen (TYLENOL) if needed  LORazepam (ATIVAN)  lovastatin (MEVACOR)  7 days prior to surgery STOP taking any Aspirin(unless otherwise instructed by your surgeon), Aleve, Naproxen, Ibuprofen, Motrin, Advil, Goody's, BC's, all herbal medications, fish oil, and all vitamins     Do not wear jewelry, make-up or nail polish.  Do not wear lotions, powders, or perfumes, or deodorant.  Do not shave 48 hours prior to surgery.  Men may shave face and neck.  Do not bring valuables to the hospital.  [facility] is not responsible for any belongings or valuables.  Contacts, dentures or bridgework may not be worn into surgery.  Leave your suitcase in the car.  After surgery it may be brought to your room.  For patients admitted to the hospital, discharge time will be determined by your treatment team.  Patients discharged the day of surgery will not be allowed to drive home.    - Preparing For Surgery  Before surgery, you can play an important role. Because skin is not sterile, your skin needs to be as free of germs as possible. You can reduce the number of germs on your skin by washing with CHG (chlorahexidine gluconate) Soap before  surgery.  CHG is an antiseptic cleaner which kills germs and bonds with the skin to continue killing germs even after washing.    Oral Hygiene is also important to reduce your risk of infection.  Remember - BRUSH YOUR TEETH THE MORNING OF SURGERY WITH YOUR REGULAR TOOTHPASTE  Please do not use if you have an allergy to CHG or antibacterial soaps. If your skin becomes reddened/irritated stop using the CHG.  Do not shave (including legs and underarms) for at least 48 hours prior to first CHG shower. It is OK to shave your face.  Please follow these instructions carefully.   1. Shower the NIGHT BEFORE SURGERY and the MORNING OF SURGERY with CHG.   2. If you chose to wash your hair, wash your hair first as usual with your normal shampoo.  3. After you shampoo, rinse your hair and body thoroughly to remove the shampoo.  4. Use CHG as you would any other liquid soap. You can apply CHG directly to the skin and wash gently with a scrungie or a clean washcloth.   5. Apply the CHG Soap to your body ONLY FROM THE NECK DOWN.  Do not use on open wounds or open sores. Avoid contact with your eyes, ears, mouth and genitals (private parts). Wash Face and genitals (private parts)  with your normal soap.  6. Wash thoroughly, paying special attention to the area where your surgery will be performed.  7. Thoroughly rinse your body with  warm water from the neck down.  8. DO NOT shower/wash with your normal soap after using and rinsing off the CHG Soap.  9. Pat yourself dry with a CLEAN TOWEL.  10. Wear CLEAN PAJAMAS to bed the night before surgery, wear comfortable clothes the morning of surgery  11. Place CLEAN SHEETS on your bed the night of your first shower and DO NOT SLEEP WITH PETS.    Day of Surgery:  Do not apply any deodorants/lotions.  Please wear clean clothes to the hospital/surgery center.   Remember to brush your teeth WITH YOUR REGULAR TOOTHPASTE.    Please read over the following  fact sheets that you were given.

## 2018-02-20 ENCOUNTER — Encounter (HOSPITAL_COMMUNITY): Payer: Self-pay

## 2018-02-20 ENCOUNTER — Encounter (HOSPITAL_COMMUNITY)
Admission: RE | Admit: 2018-02-20 | Discharge: 2018-02-20 | Disposition: A | Discharge status: Home / Self Care | Payer: Medicare Other | Source: Ambulatory Visit | Attending: General Surgery | Admitting: General Surgery

## 2018-02-20 ENCOUNTER — Other Ambulatory Visit: Payer: Self-pay

## 2018-02-20 DIAGNOSIS — Z01818 Encounter for other preprocedural examination: Secondary | ICD-10-CM | POA: Insufficient documentation

## 2018-02-20 HISTORY — DX: Disease of pericardium, unspecified: I31.9

## 2018-02-20 HISTORY — DX: Thyroiditis, unspecified: E06.9

## 2018-02-20 LAB — CBC
HCT: 43.5 % (ref 36.0–46.0)
Hemoglobin: 13.6 g/dL (ref 12.0–15.0)
MCH: 29.9 pg (ref 26.0–34.0)
MCHC: 31.3 g/dL (ref 30.0–36.0)
MCV: 95.6 fL (ref 80.0–100.0)
NRBC: 0 % (ref 0.0–0.2)
PLATELETS: 399 10*3/uL (ref 150–400)
RBC: 4.55 MIL/uL (ref 3.87–5.11)
RDW: 13.2 % (ref 11.5–15.5)
WBC: 10.2 10*3/uL (ref 4.0–10.5)

## 2018-02-20 NOTE — Progress Notes (Signed)
PCP - Dr. Asencion Noble MD  Chest x-ray - N/A  EKG - 11/26/9  Blood Thinner Instructions: N/A Aspirin Instructions: N/A  Anesthesia review: none  Patient denies shortness of breath, fever, cough and chest pain at PAT appointment   Patient verbalized understanding of instructions that were given to them at the PAT appointment. Patient was also instructed that they will need to review over the PAT instructions again at home before surgery.

## 2018-02-26 ENCOUNTER — Ambulatory Visit
Admission: RE | Admit: 2018-02-26 | Discharge: 2018-02-26 | Disposition: A | Discharge status: Home / Self Care | Payer: Medicare Other | Source: Ambulatory Visit | Attending: General Surgery | Admitting: General Surgery

## 2018-02-26 DIAGNOSIS — D0511 Intraductal carcinoma in situ of right breast: Secondary | ICD-10-CM

## 2018-02-27 ENCOUNTER — Encounter (HOSPITAL_COMMUNITY)
Admission: RE | Disposition: A | Discharge status: Home / Self Care | Payer: Self-pay | Source: Ambulatory Visit | Attending: General Surgery

## 2018-02-27 ENCOUNTER — Ambulatory Visit (HOSPITAL_COMMUNITY): Payer: Medicare Other | Admitting: Certified Registered"

## 2018-02-27 ENCOUNTER — Ambulatory Visit (HOSPITAL_COMMUNITY)
Admission: RE | Admit: 2018-02-27 | Discharge: 2018-02-27 | Disposition: A | Discharge status: Home / Self Care | Payer: Medicare Other | Source: Ambulatory Visit | Attending: General Surgery | Admitting: General Surgery

## 2018-02-27 ENCOUNTER — Other Ambulatory Visit: Payer: Self-pay

## 2018-02-27 ENCOUNTER — Encounter (HOSPITAL_COMMUNITY): Payer: Self-pay | Admitting: General Practice

## 2018-02-27 ENCOUNTER — Ambulatory Visit
Admission: RE | Admit: 2018-02-27 | Discharge: 2018-02-27 | Disposition: A | Discharge status: Home / Self Care | Payer: Medicare Other | Source: Ambulatory Visit | Attending: General Surgery | Admitting: General Surgery

## 2018-02-27 DIAGNOSIS — E785 Hyperlipidemia, unspecified: Secondary | ICD-10-CM | POA: Insufficient documentation

## 2018-02-27 DIAGNOSIS — Z90710 Acquired absence of both cervix and uterus: Secondary | ICD-10-CM | POA: Diagnosis not present

## 2018-02-27 DIAGNOSIS — Z8249 Family history of ischemic heart disease and other diseases of the circulatory system: Secondary | ICD-10-CM | POA: Diagnosis not present

## 2018-02-27 DIAGNOSIS — M810 Age-related osteoporosis without current pathological fracture: Secondary | ICD-10-CM | POA: Diagnosis not present

## 2018-02-27 DIAGNOSIS — Z170 Estrogen receptor positive status [ER+]: Secondary | ICD-10-CM | POA: Diagnosis not present

## 2018-02-27 DIAGNOSIS — Z79899 Other long term (current) drug therapy: Secondary | ICD-10-CM | POA: Insufficient documentation

## 2018-02-27 DIAGNOSIS — Z8379 Family history of other diseases of the digestive system: Secondary | ICD-10-CM | POA: Diagnosis not present

## 2018-02-27 DIAGNOSIS — Z90411 Acquired partial absence of pancreas: Secondary | ICD-10-CM | POA: Insufficient documentation

## 2018-02-27 DIAGNOSIS — D0511 Intraductal carcinoma in situ of right breast: Secondary | ICD-10-CM

## 2018-02-27 DIAGNOSIS — F419 Anxiety disorder, unspecified: Secondary | ICD-10-CM | POA: Diagnosis not present

## 2018-02-27 DIAGNOSIS — Z882 Allergy status to sulfonamides status: Secondary | ICD-10-CM | POA: Diagnosis not present

## 2018-02-27 DIAGNOSIS — M1990 Unspecified osteoarthritis, unspecified site: Secondary | ICD-10-CM | POA: Insufficient documentation

## 2018-02-27 DIAGNOSIS — Z833 Family history of diabetes mellitus: Secondary | ICD-10-CM | POA: Insufficient documentation

## 2018-02-27 DIAGNOSIS — Z881 Allergy status to other antibiotic agents status: Secondary | ICD-10-CM | POA: Insufficient documentation

## 2018-02-27 HISTORY — PX: BREAST LUMPECTOMY WITH RADIOACTIVE SEED LOCALIZATION: SHX6424

## 2018-02-27 SURGERY — BREAST LUMPECTOMY WITH RADIOACTIVE SEED LOCALIZATION
Anesthesia: General | Laterality: Right

## 2018-02-27 MED ORDER — OXYCODONE HCL 5 MG PO TABS: 5.0000 mg | ORAL_TABLET | ORAL | Status: DC | PRN

## 2018-02-27 MED ORDER — DEXAMETHASONE SODIUM PHOSPHATE 10 MG/ML IJ SOLN
INTRAMUSCULAR | Status: DC | PRN
  Administered 2018-02-27: 5 mg via INTRAVENOUS

## 2018-02-27 MED ORDER — ACETAMINOPHEN 500 MG PO TABS: 1000.0000 mg | ORAL_TABLET | Freq: Four times a day (QID) | ORAL | Status: DC

## 2018-02-27 MED ORDER — GABAPENTIN 100 MG PO CAPS
100.0000 mg | ORAL_CAPSULE | ORAL | Status: AC
  Administered 2018-02-27: 100 mg via ORAL
  Filled 2018-02-27: qty 1

## 2018-02-27 MED ORDER — PROPOFOL 10 MG/ML IV BOLUS
INTRAVENOUS | Status: AC
  Filled 2018-02-27: qty 40

## 2018-02-27 MED ORDER — BUPIVACAINE-EPINEPHRINE (PF) 0.25% -1:200000 IJ SOLN
INTRAMUSCULAR | Status: AC
  Filled 2018-02-27: qty 30

## 2018-02-27 MED ORDER — HYDROMORPHONE HCL 1 MG/ML IJ SOLN: 0.2500 mg | INTRAMUSCULAR | Status: DC | PRN

## 2018-02-27 MED ORDER — EPHEDRINE SULFATE 50 MG/ML IJ SOLN
INTRAMUSCULAR | Status: DC | PRN
  Administered 2018-02-27: 5 mg via INTRAVENOUS

## 2018-02-27 MED ORDER — OXYCODONE HCL 5 MG PO TABS: 5.0000 mg | ORAL_TABLET | Freq: Once | ORAL | Status: DC | PRN

## 2018-02-27 MED ORDER — SODIUM CHLORIDE 0.9 % IV SOLN: INTRAVENOUS | Status: DC

## 2018-02-27 MED ORDER — GLYCOPYRROLATE 0.2 MG/ML IJ SOLN
INTRAMUSCULAR | Status: DC | PRN
  Administered 2018-02-27: .1 mg via INTRAVENOUS

## 2018-02-27 MED ORDER — LIDOCAINE 2% (20 MG/ML) 5 ML SYRINGE
INTRAMUSCULAR | Status: DC | PRN
  Administered 2018-02-27: 60 mg via INTRAVENOUS

## 2018-02-27 MED ORDER — PROPOFOL 10 MG/ML IV BOLUS
INTRAVENOUS | Status: DC | PRN
  Administered 2018-02-27: 150 mg via INTRAVENOUS

## 2018-02-27 MED ORDER — ACETAMINOPHEN 500 MG PO TABS
1000.0000 mg | ORAL_TABLET | ORAL | Status: AC
  Administered 2018-02-27: 1000 mg via ORAL
  Filled 2018-02-27: qty 2

## 2018-02-27 MED ORDER — 0.9 % SODIUM CHLORIDE (POUR BTL) OPTIME
TOPICAL | Status: DC | PRN
  Administered 2018-02-27: 1000 mL

## 2018-02-27 MED ORDER — CIPROFLOXACIN IN D5W 400 MG/200ML IV SOLN
400.0000 mg | INTRAVENOUS | Status: AC
  Administered 2018-02-27: 400 mg via INTRAVENOUS
  Filled 2018-02-27 (×2): qty 200

## 2018-02-27 MED ORDER — ONDANSETRON HCL 4 MG/2ML IJ SOLN
INTRAMUSCULAR | Status: DC | PRN
  Administered 2018-02-27: 4 mg via INTRAVENOUS

## 2018-02-27 MED ORDER — OXYCODONE HCL 5 MG/5ML PO SOLN: 5.0000 mg | Freq: Once | ORAL | Status: DC | PRN

## 2018-02-27 MED ORDER — FENTANYL CITRATE (PF) 250 MCG/5ML IJ SOLN
INTRAMUSCULAR | Status: DC | PRN
  Administered 2018-02-27 (×3): 25 ug via INTRAVENOUS

## 2018-02-27 MED ORDER — BUPIVACAINE-EPINEPHRINE 0.25% -1:200000 IJ SOLN
INTRAMUSCULAR | Status: DC | PRN
  Administered 2018-02-27: 10 mL

## 2018-02-27 MED ORDER — SODIUM CHLORIDE 0.9% FLUSH: 3.0000 mL | Freq: Two times a day (BID) | INTRAVENOUS | Status: DC

## 2018-02-27 MED ORDER — LACTATED RINGERS IV SOLN
INTRAVENOUS | Status: DC | PRN
  Administered 2018-02-27: 07:00:00 via INTRAVENOUS

## 2018-02-27 MED ORDER — SODIUM CHLORIDE 0.9 % IV SOLN
INTRAVENOUS | Status: DC | PRN
  Administered 2018-02-27: 30 ug/min via INTRAVENOUS

## 2018-02-27 MED ORDER — SODIUM CHLORIDE 0.9% FLUSH: 3.0000 mL | INTRAVENOUS | Status: DC | PRN

## 2018-02-27 MED ORDER — PROMETHAZINE HCL 25 MG/ML IJ SOLN: 6.2500 mg | INTRAMUSCULAR | Status: DC | PRN

## 2018-02-27 MED ORDER — SODIUM CHLORIDE 0.9 % IV SOLN: 250.0000 mL | INTRAVENOUS | Status: DC | PRN

## 2018-02-27 MED ORDER — FENTANYL CITRATE (PF) 250 MCG/5ML IJ SOLN
INTRAMUSCULAR | Status: AC
  Filled 2018-02-27: qty 5

## 2018-02-27 SURGICAL SUPPLY — 51 items
ADH SKN CLS APL DERMABOND .7 (GAUZE/BANDAGES/DRESSINGS) ×1
APPLIER CLIP 9.375 MED OPEN (MISCELLANEOUS)
APR CLP MED 9.3 20 MLT OPN (MISCELLANEOUS)
BINDER BREAST LRG (GAUZE/BANDAGES/DRESSINGS) ×2 IMPLANT
BINDER BREAST XLRG (GAUZE/BANDAGES/DRESSINGS) IMPLANT
BLADE SURG 15 STRL LF DISP TIS (BLADE) ×1 IMPLANT
BLADE SURG 15 STRL SS (BLADE) ×3
CANISTER SUCT 3000ML PPV (MISCELLANEOUS) ×3 IMPLANT
CHLORAPREP W/TINT 26ML (MISCELLANEOUS) ×3 IMPLANT
CLIP APPLIE 9.375 MED OPEN (MISCELLANEOUS) IMPLANT
CLIP VESOCCLUDE MED 6/CT (CLIP) ×3 IMPLANT
CLOSURE WOUND 1/2 X4 (GAUZE/BANDAGES/DRESSINGS) ×1
COVER PROBE W GEL 5X96 (DRAPES) ×3 IMPLANT
COVER SURGICAL LIGHT HANDLE (MISCELLANEOUS) ×3 IMPLANT
COVER WAND RF STERILE (DRAPES) ×1 IMPLANT
DERMABOND ADVANCED (GAUZE/BANDAGES/DRESSINGS) ×2
DERMABOND ADVANCED .7 DNX12 (GAUZE/BANDAGES/DRESSINGS) ×1 IMPLANT
DEVICE DUBIN SPECIMEN MAMMOGRA (MISCELLANEOUS) ×3 IMPLANT
DRAPE CHEST BREAST 15X10 FENES (DRAPES) ×3 IMPLANT
DRAPE UTILITY XL STRL (DRAPES) ×3 IMPLANT
ELECT COATED BLADE 2.86 ST (ELECTRODE) ×3 IMPLANT
ELECT REM PT RETURN 9FT ADLT (ELECTROSURGICAL) ×3
ELECTRODE REM PT RTRN 9FT ADLT (ELECTROSURGICAL) ×1 IMPLANT
GLOVE BIO SURGEON STRL SZ7 (GLOVE) ×6 IMPLANT
GLOVE BIOGEL PI IND STRL 7.5 (GLOVE) ×1 IMPLANT
GLOVE BIOGEL PI INDICATOR 7.5 (GLOVE) ×2
GOWN STRL REUS W/ TWL LRG LVL3 (GOWN DISPOSABLE) ×2 IMPLANT
GOWN STRL REUS W/TWL LRG LVL3 (GOWN DISPOSABLE) ×6
KIT BASIN OR (CUSTOM PROCEDURE TRAY) ×3 IMPLANT
KIT MARKER MARGIN INK (KITS) ×3 IMPLANT
LIGHT WAVEGUIDE WIDE FLAT (MISCELLANEOUS) IMPLANT
NDL HYPO 25GX1X1/2 BEV (NEEDLE) ×1 IMPLANT
NEEDLE HYPO 25GX1X1/2 BEV (NEEDLE) ×3 IMPLANT
NS IRRIG 1000ML POUR BTL (IV SOLUTION) ×3 IMPLANT
PACK SURGICAL SETUP 50X90 (CUSTOM PROCEDURE TRAY) ×3 IMPLANT
PENCIL BUTTON HOLSTER BLD 10FT (ELECTRODE) ×3 IMPLANT
SPONGE LAP 18X18 X RAY DECT (DISPOSABLE) ×3 IMPLANT
STRIP CLOSURE SKIN 1/2X4 (GAUZE/BANDAGES/DRESSINGS) ×2 IMPLANT
SUT MNCRL AB 4-0 PS2 18 (SUTURE) ×3 IMPLANT
SUT SILK 2 0 SH (SUTURE) ×2 IMPLANT
SUT VIC AB 2-0 SH 27 (SUTURE) ×3
SUT VIC AB 2-0 SH 27XBRD (SUTURE) ×1 IMPLANT
SUT VIC AB 3-0 SH 27 (SUTURE) ×3
SUT VIC AB 3-0 SH 27X BRD (SUTURE) ×1 IMPLANT
SYR BULB 3OZ (MISCELLANEOUS) ×3 IMPLANT
SYR CONTROL 10ML LL (SYRINGE) ×3 IMPLANT
TOWEL OR 17X24 6PK STRL BLUE (TOWEL DISPOSABLE) ×3 IMPLANT
TOWEL OR 17X26 10 PK STRL BLUE (TOWEL DISPOSABLE) ×1 IMPLANT
TUBE CONNECTING 12'X1/4 (SUCTIONS) ×1
TUBE CONNECTING 12X1/4 (SUCTIONS) ×2 IMPLANT
YANKAUER SUCT BULB TIP NO VENT (SUCTIONS) ×3 IMPLANT

## 2018-02-27 NOTE — H&P (Signed)
Caitlin Alexander is an 82 y.o. female.   Chief Complaint: breast cancer HPI:  48 yof who underwent right breast lumpectomy 11/18 for dcis. she did well with this. she had 7 mm of hg dcis with pos margins initially. i excised more and margins were close. she did not want more surgery and refused referrals then. she did not undergo adjuvant therapy at all. she has been followed by Dr Willey Blade and eventualy agreed to see oncology. She was seen by Dr Delton Coombes in July. she considered tamoxifen but does not want this either. she is concerned about radiotherapy due to a relative who had lung cancer and esophagitis. she then underwent repeat mm 10/19that shows postop changes and lateral and anterior to lumpectomy site there is an area of 1.4x1x2 cm of calcs. in the Left breast there is 84m of calcs present. stereo biopsies were done. she has u[place]of right axilla that is negative. biopsy of left is fcc and concordant. biopsy of right is hg dcis with foci suspicious for microinvasion that is er/pr pos. we discussed options previously which I told her were lumpectomy with antiestrogen or mastectomy. at that time she wanted a mastectomy . she has talked with Dr FWilley Bladeagain and would like to consider less surgery  Past Medical History:  Diagnosis Date  . Anxiety   . Arthritis   . [place]  . Cancer ([facility]    breast cancer  . Hyperlipidemia   . Osteoporosis   . Pericarditis 1961  . PONV (postoperative nausea and vomiting)   . Thyroiditis 1971    Past Surgical History:  Procedure Laterality Date  . ABDOMINAL HYSTERECTOMY    . ABDOMINAL SURGERY    . APPENDECTOMY    . BOWEL RESECTION  10/31/2011   Procedure: SMALL BOWEL RESECTION;  Surgeon: BDonato Heinz MD;  Location: [facility];  Service: General;;  . BREAST LUMPECTOMY Right   . BREAST LUMPECTOMY WITH RADIOACTIVE SEED LOCALIZATION Right 02/07/2017   Procedure: BREAST LUMPECTOMY WITH RADIOACTIVE SEED LOCALIZATION;  Surgeon: WRolm Bookbinder MD;  [place]  Service: General;  Laterality: Right;  . FISSURECTOMY    . 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 LAPAROTOMY  10/31/2011   Procedure: EXPLORATORY LAPAROTOMY;  Surgeon: BDonato Heinz MD;  Location: [facility];  Service: General;  Laterality: N/A;  . NASAL SEPTUM SURGERY  1990  . NASAL SINUS SURGERY    . TUBAL LIGATION      Family History  Problem Relation Age of Onset  . Diabetes Brother   . Hypertension Son   . Ulcers Sister    Social History:  reports that she has never smoked. She has never used smokeless tobacco. She reports that she does not drink alcohol or use drugs.  Allergies:  Allergies  Allergen Reactions  . Macrodantin [Nitrofurantoin Macrocrystal] Other (See Comments)    Unknown  . Promethazine Other (See Comments)    Hallucinations  . Sulfa Antibiotics Other (See Comments)    [place]  . Suprax [Cefixime] Other (See Comments)    Unknown    Medications Prior to Admission  Medication Sig Dispense Refill  . Carboxymethylcellulose Sodium (THERATEARS OP) Place 2 drops into both eyes daily as needed (for dry eyes).     . LORazepam (ATIVAN) 1 MG tablet Take 1 mg by mouth 3 (three) times daily.     .Marland Kitchenlovastatin (MEVACOR) 20 MG tablet Take 20 mg by mouth daily.  11  .  polyethylene glycol (MIRALAX / GLYCOLAX) packet Take 17 g by mouth every morning.    Marland Kitchen acetaminophen (TYLENOL) 500 MG tablet Take 500 mg by mouth every 6 (six) hours as needed for moderate pain or headache.       No results found for this or any previous visit (from the past 48 hour(s)). Mm Rt Radioactive Seed Loc Mammo Guide  Result Date: 02/26/2018 CLINICAL DATA:  The patient presents for radioactive seed localization prior to lumpectomy for RIGHT breast cancer. EXAM: MAMMOGRAPHIC GUIDED RADIOACTIVE SEED LOCALIZATION OF THE RIGHT BREAST COMPARISON:  Previous exam(s). FINDINGS: Patient presents for radioactive seed localization prior to lumpectomy. I met  with the patient and we discussed the procedure of seed localization including benefits and alternatives. We discussed the high likelihood of a successful procedure. We discussed the risks of the procedure including infection, bleeding, tissue injury and further surgery. We discussed the low dose of radioactivity involved in the procedure. Informed, written consent was given. The usual time-out protocol was performed immediately prior to the procedure. Using mammographic guidance, sterile technique, 1% lidocaine and an I-125 radioactive seed, it the coil shaped clip and associated calcifications in the LATERAL portion of the RIGHT breast was localized using a superior to inferior approach. The follow-up mammogram images confirm the seed in the expected location and were marked for Dr. Donne Hazel. Follow-up survey of the patient confirms presence of the radioactive seed. Order number of I-125 seed:  [id number]. Total activity:  6.599 millicurie reference Date: Is 02/05/2018 The patient tolerated the procedure well and was released from the [facility]. She was given instructions regarding seed removal. IMPRESSION: Radioactive seed localization right breast. No apparent complications. Electronically Signed   By: Nolon Nations M.D.   On: 02/26/2018 14:36    ROS  negative  Blood pressure (!) 154/68, pulse 66, temperature 97.7 F (36.5 C), resp. rate 18, height _0  (1.6 m), weight 60.6 kg, SpO2 98 %. Physical Exam  cv rrr Lungs clear Breast incision well healed, no palpable mass No lymphadenpathy  Assessment/Plan  DUCTAL CARCINOMA IN SITU (DCIS) OF RIGHT BREAST (D05.11) Story: Right breast seed guided lumpectomy we had long conversation with her son present. We discussed that last time she needed some sort of adjuvant therapy either with radiotherapy or antiestrogen and that is why this recurred early. she desired mastectomy last visit which is a reasonable strategy for her and she would tolerate  and recover from this just fine. she would like to consider lumpectomy again. I think this is reasonable with understanding she could have positive margins necessitating a second surgery. she will also need some form of adjuvant therapy either antiestrogen (which I think would be ideal) or radiotherapy. we had long conversation about antiestrogens and she is willing to do that now. she had some misunderstanding that this would be similar to an antibiotic. I am concerned this recurrence may already have some invasion and further would put her at similar risks. we will plan to proceed with lumpectomy with seed guidance   Rolm Bookbinder, MD 02/27/2018, 7:14 AM

## 2018-02-27 NOTE — Interval H&P Note (Signed)
History and Physical Interval Note:  02/27/2018 7:15 AM  Caitlin Alexander  has presented today for surgery, with the diagnosis of DUCTAL CARCINOMA RIGHT BREAST  The various methods of treatment have been discussed with the patient and family. After consideration of risks, benefits and other options for treatment, the patient has consented to  Procedure(s): RIGHT BREAST LUMPECTOMY WITH RADIOACTIVE SEED LOCALIZATION (Right) as a surgical intervention .  The patient's history has been reviewed, patient examined, no change in status, stable for surgery.  I have reviewed the patient's chart and labs.  Questions were answered to the patient's satisfaction.     Rolm Bookbinder

## 2018-02-27 NOTE — Anesthesia Procedure Notes (Signed)
Procedure Name: LMA Insertion Date/Time: 02/27/2018 7:41 AM Performed by: Cleda Daub, CRNA Pre-anesthesia Checklist: Patient identified, Emergency Drugs available, Suction available and Patient being monitored Patient Re-evaluated:Patient Re-evaluated prior to induction Oxygen Delivery Method: Circle system utilized Preoxygenation: Pre-oxygenation with 100% oxygen Induction Type: IV induction LMA: LMA inserted LMA Size: 4.0 Placement Confirmation: positive ETCO2 Tube secured with: Tape Dental Injury: Teeth and Oropharynx as per pre-operative assessment

## 2018-02-27 NOTE — Discharge Instructions (Signed)
[facility] Office Phone Number [phone]  BREAST BIOPSY/ PARTIAL MASTECTOMY: POST OP INSTRUCTIONS Take 400 mg of ibuprofen every 8 hours or 650 mg tylenol every 6 hours for next 72 hours then as needed. Use ice several times daily also. Always review your discharge instruction sheet given to you by the facility where your surgery was performed.  IF YOU HAVE DISABILITY OR FAMILY LEAVE FORMS, YOU MUST BRING THEM TO THE OFFICE FOR PROCESSING.  DO NOT GIVE THEM TO YOUR DOCTOR.  1. A prescription for pain medication may be given to you upon discharge.  Take your pain medication as prescribed, if needed.  If narcotic pain medicine is not needed, then you may take acetaminophen (Tylenol), naprosyn (Alleve) or ibuprofen (Advil) as needed. 2. Take your usually prescribed medications unless otherwise directed 3. If you need a refill on your pain medication, please contact your pharmacy.  They will contact our office to request authorization.  Prescriptions will not be filled after 5pm or on week-ends. 4. You should eat very light the first 24 hours after surgery, such as soup, crackers, pudding, etc.  Resume your normal diet the day after surgery. 5. Most patients will experience some swelling and bruising in the breast.  Ice packs and a good support bra will help.  Wear the breast binder provided or a sports bra for 72 hours day and night.  After that wear a sports bra during the day until you return to the office. Swelling and bruising can take several days to resolve.  6. It is common to experience some constipation if taking pain medication after surgery.  Increasing fluid intake and taking a stool softener will usually help or prevent this problem from occurring.  A mild laxative (Milk of Magnesia or Miralax) should be taken according to package directions if there are no bowel movements after 48 hours. 7. Unless discharge instructions indicate otherwise, you may remove your bandages 48  hours after surgery and you may shower at that time.  You may have steri-strips (small skin tapes) in place directly over the incision.  These strips should be left on the skin for 7-10 days and will come off on their own.  If your surgeon used skin glue on the incision, you may shower in 24 hours.  The glue will flake off over the next 2-3 weeks.  Any sutures or staples will be removed at the office during your follow-up visit. 8. ACTIVITIES:  You may resume regular daily activities (gradually increasing) beginning the next day.  Wearing a good support bra or sports bra minimizes pain and swelling.  You may have sexual intercourse when it is comfortable. a. You may drive when you no longer are taking prescription pain medication, you can comfortably wear a seatbelt, and you can safely maneuver your car and apply brakes. b. RETURN TO WORK:  ______________________________________________________________________________________ 9. You should see your doctor in the office for a follow-up appointment approximately two weeks after your surgery.  Your doctor's nurse will typically make your follow-up appointment when she calls you with your pathology report.  Expect your pathology report 3-4 business days after your surgery.  You may call to check if you do not hear from us after three days. 10. OTHER INSTRUCTIONS: _______________________________________________________________________________________________ _____________________________________________________________________________________________________________________________________ _____________________________________________________________________________________________________________________________________ _____________________________________________________________________________________________________________________________________  WHEN TO CALL DR Asuzena Weis: 1. Fever over 101.0 2. Nausea and/or vomiting. 3. Extreme swelling or  bruising. 4. Continued bleeding from incision. 5. Increased pain, redness, or drainage from the incision.  The clinic   staff is available to answer your questions during regular business hours.  Please don't hesitate to call and ask to speak to one of the nurses for clinical concerns.  If you have a medical emergency, go to the nearest emergency room or call 911.  A surgeon from [facility] is always on call at the hospital.  For further questions, please visit [email or web address]  

## 2018-02-27 NOTE — Anesthesia Postprocedure Evaluation (Signed)
Anesthesia Post Note  Patient: Caitlin Alexander  Procedure(s) Performed: RIGHT BREAST LUMPECTOMY WITH RADIOACTIVE SEED LOCALIZATION (Right )     Patient location during evaluation: PACU Anesthesia Type: General Level of consciousness: awake and alert Pain management: pain level controlled Vital Signs Assessment: post-procedure vital signs reviewed and stable Respiratory status: spontaneous breathing, nonlabored ventilation and respiratory function stable Cardiovascular status: blood pressure returned to baseline and stable Postop Assessment: no apparent nausea or vomiting Anesthetic complications: no    Last Vitals:  Vitals:   02/27/18 0850 02/27/18 0902  BP: 120/76 123/77  Pulse: 77 76  Resp: 20 18  Temp: (!) 36.4 C   SpO2: 97% 97%    Last Pain:  Vitals:   02/27/18 0902  PainSc: 0-No pain                 [name]

## 2018-02-27 NOTE — Anesthesia Preprocedure Evaluation (Signed)
Anesthesia Evaluation  Patient identified by MRN, date of birth, ID band Patient awake    History of Anesthesia Complications (+) PONV  Airway Mallampati: II  TM Distance: >3 FB Neck ROM: Full    Dental no notable dental hx.    Pulmonary    Pulmonary exam normal breath sounds clear to auscultation       Cardiovascular negative cardio ROS Normal cardiovascular exam Rhythm:Regular Rate:Normal     Neuro/Psych Anxiety    GI/Hepatic negative GI ROS, Neg liver ROS,   Endo/Other    Renal/GU negative Renal ROS     Musculoskeletal  (+) Arthritis , Osteoarthritis,    Abdominal   Peds  Hematology   Anesthesia Other Findings Breast Cancer  Reproductive/Obstetrics                             Anesthesia Physical  Anesthesia Plan  ASA: III  Anesthesia Plan: General   Post-op Pain Management:    Induction: Intravenous  PONV Risk Score and Plan: 4 or greater and Treatment may vary due to age or medical condition, Ondansetron, Dexamethasone and Midazolam  Airway Management Planned: LMA  Additional Equipment:   Intra-op Plan:   Post-operative Plan: Extubation in OR  Informed Consent: I have reviewed the patients History and Physical, chart, labs and discussed the procedure including the risks, benefits and alternatives for the proposed anesthesia with the patient or authorized representative who has indicated his/her understanding and acceptance.   Dental advisory given  Plan Discussed with:   Anesthesia Plan Comments:         Anesthesia Quick Evaluation

## 2018-02-27 NOTE — Transfer of Care (Signed)
Immediate Anesthesia Transfer of Care Note  Patient: Caitlin Alexander  Procedure(s) Performed: RIGHT BREAST LUMPECTOMY WITH RADIOACTIVE SEED LOCALIZATION (Right )  Patient Location: PACU  Anesthesia Type:General  Level of Consciousness: awake, alert , oriented and patient cooperative  Airway & Oxygen Therapy: Patient Spontanous Breathing and Patient connected to face mask oxygen  Post-op Assessment: Report given to RN and Post -op Vital signs reviewed and stable  Post vital signs: Reviewed and stable  Last Vitals:  Vitals Value Taken Time  BP 128/72 02/27/2018  8:28 AM  Temp    Pulse 84 02/27/2018  8:30 AM  Resp 11 02/27/2018  8:30 AM  SpO2 96 % 02/27/2018  8:30 AM  Vitals shown include unvalidated device data.  Last Pain:  Vitals:   02/27/18 0631  PainSc: 0-No pain      Patients Stated Pain Goal: 2 (69/67/89 3810)  Complications: No apparent anesthesia complications

## 2018-02-27 NOTE — Op Note (Signed)
Preoperative diagnosis:recurrent right breast dcis, question of invasion on core biopsy Postoperative diagnosis: Same as above Procedure: Right breast seed guided lumpectomy Surgeon: Dr. Serita Grammes Anesthesia: General Estimated blood loss: Less than 30 cc Specimens: rightbreast tissue marked with paint Complications: None Drains: None Sponge needle count was correct at completion Disposition to recovery in stable condition  Indications: This isan 32 yof I know from a year ago with lumpectomy for dcis. She declined any adjuvant therapy after surgery.  She now has a recurrence with calcifications on mammogram. Core biopsy shows dcis with question of invasion.  We discussed all options including mastectomy (which I think was reasonable) but have elected to proceed with lumpectomy. She understands if she is to lower recurrence risk she will need some adjuvant therapy.  She had seed placed prior to beginning and I had the mammograms available in the OR.   Procedure: After informed consent was obtained the patient was taken to the OR. She was administered antibiotics. SCDs were in place. She was then placed under general anesthesia without complication. She was prepped and draped in the standard sterile surgical fashion. A surgical timeout was then performed.  I located the seed in the lateral breast.  I infiltrated marcaine throughout this area.  I then used a portion of the old incision and then extended this.  I used the neoprobe to guide excision of the seed and the surrounding tissue. I then painted the specimen. Mammogram confirmed removal of the seed and the clip. Hemostasis was observed.  I then closed the breast tissue with 2-0 vicryl  The remaining tissue was closed with 3-0 Vicryl and4-0 Monocryl. Glue and Steri-Strips were applied as well.  A binder was placed.She tolerated this well was extubated and transferred to the recovery room stable.

## 2018-02-28 ENCOUNTER — Encounter (HOSPITAL_COMMUNITY): Payer: Self-pay | Admitting: General Surgery

## 2018-03-26 ENCOUNTER — Inpatient Hospital Stay (HOSPITAL_COMMUNITY): Payer: Medicare Other | Attending: Hematology

## 2018-03-26 DIAGNOSIS — Z170 Estrogen receptor positive status [ER+]: Secondary | ICD-10-CM | POA: Diagnosis not present

## 2018-03-26 DIAGNOSIS — D0511 Intraductal carcinoma in situ of right breast: Secondary | ICD-10-CM | POA: Diagnosis not present

## 2018-03-26 LAB — COMPREHENSIVE METABOLIC PANEL
ALBUMIN: 3.9 g/dL (ref 3.5–5.0)
ALT: 16 U/L (ref 0–44)
AST: 21 U/L (ref 15–41)
Alkaline Phosphatase: 66 U/L (ref 38–126)
Anion gap: 6 (ref 5–15)
BUN: 9 mg/dL (ref 8–23)
CO2: 27 mmol/L (ref 22–32)
Calcium: 9.2 mg/dL (ref 8.9–10.3)
Chloride: 104 mmol/L (ref 98–111)
Creatinine, Ser: 0.69 mg/dL (ref 0.44–1.00)
GFR calc Af Amer: >60 mL/min (ref >60)
GFR calc non Af Amer: >60 mL/min (ref >60)
Glucose, Bld: 74 mg/dL (ref 70–99)
Potassium: 4.3 mmol/L (ref 3.5–5.1)
SODIUM: 137 mmol/L (ref 135–145)
Total Bilirubin: 0.4 mg/dL (ref 0.3–1.2)
Total Protein: 6.9 g/dL (ref 6.5–8.1)

## 2018-03-26 LAB — CBC WITH DIFFERENTIAL/PLATELET
Abs Immature Granulocytes: 0.04 10*3/uL (ref 0.00–0.07)
BASOS ABS: 0.1 10*3/uL (ref 0.0–0.1)
Basophils Relative: 1 %
Eosinophils Absolute: 0.5 10*3/uL (ref 0.0–0.5)
Eosinophils Relative: 5 %
HCT: 41.1 % (ref 36.0–46.0)
Hemoglobin: 13.7 g/dL (ref 12.0–15.0)
IMMATURE GRANULOCYTES: 0 %
Lymphocytes Relative: 41 %
Lymphs Abs: 4.3 10*3/uL — ABNORMAL HIGH (ref 0.7–4.0)
MCH: 31.6 pg (ref 26.0–34.0)
MCHC: 33.3 g/dL (ref 30.0–36.0)
MCV: 94.9 fL (ref 80.0–100.0)
Monocytes Absolute: 1.7 10*3/uL — ABNORMAL HIGH (ref 0.1–1.0)
Monocytes Relative: 16 %
NEUTROS PCT: 37 %
NRBC: 0 % (ref 0.0–0.2)
Neutro Abs: 3.9 10*3/uL (ref 1.7–7.7)
Platelets: 384 10*3/uL (ref 150–400)
RBC: 4.33 MIL/uL (ref 3.87–5.11)
RDW: 13.2 % (ref 11.5–15.5)
WBC: 10.5 10*3/uL (ref 4.0–10.5)

## 2018-03-27 LAB — VITAMIN D 25 HYDROXY (VIT D DEFICIENCY, FRACTURES): Vit D, 25-Hydroxy: 20.1 ng/mL — ABNORMAL LOW (ref 30.0–100.0)

## 2018-04-02 ENCOUNTER — Inpatient Hospital Stay (HOSPITAL_COMMUNITY): Payer: Medicare Other | Attending: Hematology | Admitting: Hematology

## 2018-04-02 ENCOUNTER — Encounter (HOSPITAL_COMMUNITY): Payer: Self-pay | Admitting: Hematology

## 2018-04-02 ENCOUNTER — Other Ambulatory Visit: Payer: Self-pay

## 2018-04-02 VITALS — BP 134/65 | HR 67 | Temp 98.3°F | Resp 16 | Wt 134.3 lb

## 2018-04-02 DIAGNOSIS — I319 Disease of pericardium, unspecified: Secondary | ICD-10-CM | POA: Diagnosis not present

## 2018-04-02 DIAGNOSIS — Z79810 Long term (current) use of selective estrogen receptor modulators (SERMs): Secondary | ICD-10-CM

## 2018-04-02 DIAGNOSIS — F419 Anxiety disorder, unspecified: Secondary | ICD-10-CM | POA: Diagnosis not present

## 2018-04-02 DIAGNOSIS — M810 Age-related osteoporosis without current pathological fracture: Secondary | ICD-10-CM

## 2018-04-02 DIAGNOSIS — E069 Thyroiditis, unspecified: Secondary | ICD-10-CM | POA: Diagnosis not present

## 2018-04-02 DIAGNOSIS — D0511 Intraductal carcinoma in situ of right breast: Secondary | ICD-10-CM

## 2018-04-02 DIAGNOSIS — Z79899 Other long term (current) drug therapy: Secondary | ICD-10-CM

## 2018-04-02 DIAGNOSIS — M129 Arthropathy, unspecified: Secondary | ICD-10-CM

## 2018-04-02 DIAGNOSIS — E785 Hyperlipidemia, unspecified: Secondary | ICD-10-CM | POA: Diagnosis not present

## 2018-04-02 DIAGNOSIS — C50411 Malignant neoplasm of upper-outer quadrant of right female breast: Secondary | ICD-10-CM

## 2018-04-02 DIAGNOSIS — Z170 Estrogen receptor positive status [ER+]: Secondary | ICD-10-CM

## 2018-04-02 MED ORDER — ERGOCALCIFEROL 1.25 MG (50000 UT) PO CAPS: 50000.0000 [IU] | ORAL_CAPSULE | ORAL | 0 refills | Status: DC

## 2018-04-02 MED ORDER — TAMOXIFEN CITRATE 20 MG PO TABS: 20.0000 mg | ORAL_TABLET | Freq: Every day | ORAL | 3 refills | Status: DC

## 2018-04-02 NOTE — Assessment & Plan Note (Signed)
1.  Stage I (PT1APNX) right breast IDC: -She has a history of right breast high-grade DCIS, 0.7 cm, close inferior margin, status post lumpectomy on 02/07/2017. - She was offered reresection and radiation which she declined.  She also declined tamoxifen. - Mammogram on 01/02/2018 showed group of fine linear calcifications measuring 1.4 x 1 x 1 cm just lateral to the lumpectomy site. - Biopsy of the site on 01/12/2018 shows high-grade DCIS with calcifications and necrosis with a microscopic focus, ER/PR positive.  Left breast core needle biopsy showed fibrocystic changes. - She underwent right breast lumpectomy on 02/27/2018.  This showed a 0.2 cm invasive ductal carcinoma, grade 2, negative margins, no lymph nodes found.  ER/PR was positive.  Ki-67 was 10%.  HER-2 was 1+ positive. -We talked about the normal prognosis of stage I breast cancer.  We talked about starting her on antiestrogen therapy.  She has a history of severe osteoporosis.  Hence we recommended tamoxifen.  We discussed the side effects in detail.  She has declined radiation therapy.  We have sent a prescription for tamoxifen, which she will take for at least 5 years. - She will be seen back again in 4 months for follow-up.   2.  Osteoporosis: -Bone density test on 09/21/2017 shows a T score of -3.2 in the right femoral neck. -I have recommended Prolia in the past which she declined.  She is not taking calcium or vitamin D supplements. -We have checked her vitamin D which was low at 20.  I have recommended vitamin D 50,000 units weekly.  We plan to check her vitamin D level prior to next visit. 

## 2018-04-02 NOTE — Patient Instructions (Signed)
[facility] at [facility] Discharge Instructions     Thank you for choosing [facility] at [facility] to provide your oncology and hematology care.  To afford each patient quality time with our provider, please arrive at least 15 minutes before your scheduled appointment time.   If you have a lab appointment with the Cancer Center please come in thru the  Main Entrance and check in at the main information desk  You need to re-schedule your appointment should you arrive 10 or more minutes late.  We strive to give you quality time with our providers, and arriving late affects you and other patients whose appointments are after yours.  Also, if you no show three or more times for appointments you may be dismissed from the clinic at the providers discretion.     Again, thank you for choosing [facility].  Our hope is that these requests will decrease the amount of time that you wait before being seen by our physicians.       _____________________________________________________________  Should you have questions after your visit to [facility], please contact our office at [phone] between the hours of 8:00 a.m. and 4:30 p.m.  Voicemails left after 4:00 p.m. will not be returned until the following business day.  For prescription refill requests, have your pharmacy contact our office and allow 72 hours.    Cancer Center Support Programs:   > Cancer Support Group  2nd Tuesday of the month 1pm-2pm, Journey Room    

## 2018-04-02 NOTE — Progress Notes (Signed)
[place], [place] [address]   CLINIC:  Medical Oncology/Hematology  PCP:  Asencion Noble, MD [address] [place] [address] [phone]   REASON FOR VISIT: Follow-up for DCIS   CURRENT THERAPY: Tamoxifen 20 mg daily.   INTERVAL HISTORY:  [name] 83 y.o. female returns for routine follow-up for DCIS. She is here today with her friend. She is doing well since her lumpectomy. Denies any nausea, vomiting, or diarrhea. Denies any new pains. Had not noticed any recent bleeding such as epistaxis, hematuria or hematochezia. Denies recent chest pain on exertion, shortness of breath on minimal exertion, pre-syncopal episodes, or palpitations. Denies any numbness or tingling in hands or feet. Denies any recent fevers, infections, or recent hospitalizations. She reports her appetite at 100% and her energy level at 50%.    REVIEW OF SYSTEMS:  Review of Systems  All other systems reviewed and are negative.    PAST MEDICAL/SURGICAL HISTORY:  Past Medical History:  Diagnosis Date  . Anxiety   . Arthritis   . Breast cancer ([place]) 2018  . Cancer [facility])    breast cancer  . Hyperlipidemia   . Osteoporosis   . Pericarditis 1961  . PONV (postoperative nausea and vomiting)   . Thyroiditis 1971   Past Surgical History:  Procedure Laterality Date  . ABDOMINAL HYSTERECTOMY    . ABDOMINAL SURGERY    . APPENDECTOMY    . BOWEL RESECTION  10/31/2011   Procedure: SMALL BOWEL RESECTION;  Surgeon: Donato Heinz, MD;  Location: [facility];  Service: General;;  . BREAST LUMPECTOMY Right   . BREAST LUMPECTOMY WITH RADIOACTIVE SEED LOCALIZATION Right 02/07/2017   Procedure: BREAST LUMPECTOMY WITH RADIOACTIVE SEED LOCALIZATION;  Surgeon: Rolm Bookbinder, MD;  Location: [facility];  Service: General;  Laterality: Right;  . BREAST LUMPECTOMY WITH RADIOACTIVE SEED LOCALIZATION Right 02/27/2018   Procedure: RIGHT BREAST LUMPECTOMY WITH RADIOACTIVE SEED  LOCALIZATION;  Surgeon: Rolm Bookbinder, MD;  Location: [facility];  Service: General;  Laterality: Right;  . FISSURECTOMY    . 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 LAPAROTOMY  10/31/2011   Procedure: EXPLORATORY LAPAROTOMY;  Surgeon: Donato Heinz, MD;  Location: [facility];  Service: General;  Laterality: N/A;  . NASAL SEPTUM SURGERY  1990  . NASAL SINUS SURGERY    . TUBAL LIGATION       SOCIAL HISTORY:  Social History   Socioeconomic History  . Marital status: Widowed    Spouse name: Not on file  . Number of children: Not on file  . Years of education: Not on file  . Highest education level: Not on file  Occupational History  . Not on file  Social Needs  . Financial resource strain: Not on file  . Food insecurity:    Worry: Not on file    Inability: Not on file  . Transportation needs:    Medical: Not on file    Non-medical: Not on file  Tobacco Use  . Smoking status: Never Smoker  . Smokeless tobacco: Never Used  Substance and Sexual Activity  . Alcohol use: No  . Drug use: No  . Sexual activity: Never  Lifestyle  . Physical activity:    Days per week: Not on file    Minutes per session: Not on file  . Stress: Not on file  Relationships  . Social connections:    Talks on phone: Not on file  Gets together: Not on file    Attends religious service: Not on file    Active member of club or organization: Not on file    Attends meetings of clubs or organizations: Not on file    Relationship status: Not on file  . Intimate partner violence:    Fear of current or ex partner: Not on file    Emotionally abused: Not on file    Physically abused: Not on file    Forced sexual activity: Not on file  Other Topics Concern  . Not on file  Social History Narrative  . Not on file    FAMILY HISTORY:  Family History  Problem Relation Age of Onset  . Diabetes Brother   . Hypertension Son   . Ulcers Sister     CURRENT MEDICATIONS:  Outpatient  Encounter Medications as of 04/02/2018  Medication Sig  . acetaminophen (TYLENOL) 500 MG tablet Take 500 mg by mouth every 6 (six) hours as needed for moderate pain or headache.   . Carboxymethylcellulose Sodium (THERATEARS OP) Place 2 drops into both eyes daily as needed (for dry eyes).   . LORazepam (ATIVAN) 1 MG tablet Take 1 mg by mouth 3 (three) times daily.   Marland Kitchen lovastatin (MEVACOR) 20 MG tablet Take 20 mg by mouth daily.  . polyethylene glycol (MIRALAX / GLYCOLAX) packet Take 17 g by mouth every morning.  . ergocalciferol (VITAMIN D2) 1.25 MG (50000 UT) capsule Take 1 capsule (50,000 Units total) by mouth once a week.  . tamoxifen (NOLVADEX) 20 MG tablet Take 1 tablet (20 mg total) by mouth daily.   No facility-administered encounter medications on file as of 04/02/2018.     ALLERGIES:  Allergies  Allergen Reactions  . Macrodantin [Nitrofurantoin Macrocrystal] Other (See Comments)    Unknown  . Promethazine Other (See Comments)    Hallucinations  . Sulfa Antibiotics Other (See Comments)    Whelps  . Suprax [Cefixime] Other (See Comments)    Unknown     PHYSICAL EXAM:  ECOG Performance status: 1  Vitals:   04/02/18 1400  BP: 134/65  Pulse: 67  Resp: 16  Temp: 98.3 F (36.8 C)  SpO2: 95%   Filed Weights   04/02/18 1400  Weight: 134 lb 5 oz (60.9 kg)    Physical Exam Constitutional:      Appearance: Normal appearance. She is normal weight.  Musculoskeletal: Normal range of motion.  Skin:    General: Skin is warm and dry.  Neurological:     Mental Status: She is alert and oriented to person, place, and time. Mental status is at baseline.  Psychiatric:        Mood and Affect: Mood normal.        Behavior: Behavior normal.        Thought Content: Thought content normal.        Judgment: Judgment normal.   Breast:  Left: No palpable masses, no skin changes or nipple discharge, no adenopathy. Right: Lumpectomy site in the upper outer quadrant is well-healed.  No  palpable masses in the right breast.  No palpable adenopathy.   LABORATORY DATA:  I have reviewed the labs as listed.  CBC    Component Value Date/Time   WBC 10.5 03/26/2018 1305   RBC 4.33 03/26/2018 1305   HGB 13.7 03/26/2018 1305   HCT 41.1 03/26/2018 1305   PLT 384 03/26/2018 1305   MCV 94.9 03/26/2018 1305   MCH 31.6 03/26/2018 1305   MCHC  33.3 03/26/2018 1305   RDW 13.2 03/26/2018 1305   LYMPHSABS 4.3 (H) 03/26/2018 1305   MONOABS 1.7 (H) 03/26/2018 1305   EOSABS 0.5 03/26/2018 1305   BASOSABS 0.1 03/26/2018 1305   CMP Latest Ref Rng & Units 03/26/2018 02/01/2017 06/30/2015  Glucose 70 - 99 mg/dL 74 89 102(H)  BUN 8 - 23 mg/dL 9 8 8   Creatinine 0.44 - 1.00 mg/dL 0.69 0.79 0.72  Sodium 135 - 145 mmol/L 137 138 140  Potassium 3.5 - 5.1 mmol/L 4.3 4.7 4.6  Chloride 98 - 111 mmol/L 104 103 103  CO2 22 - 32 mmol/L 27 26 27   Calcium 8.9 - 10.3 mg/dL 9.2 9.5 9.2  Total Protein 6.5 - 8.1 g/dL 6.9 - 7.2  Total Bilirubin 0.3 - 1.2 mg/dL 0.4 - 1.1  Alkaline Phos 38 - 126 U/L 66 - 65  AST 15 - 41 U/L 21 - 27  ALT 0 - 44 U/L 16 - 24       DIAGNOSTIC IMAGING:  I have independently reviewed the scans and discussed with the patient.   I have reviewed Francene Finders, NP's note and agree with the documentation.  I personally performed a face-to-face visit, made revisions and my assessment and plan is as follows.    ASSESSMENT & PLAN:   Breast cancer of upper-outer quadrant of right female breast ([place]) 1.  Stage I (PT1APNX) right breast IDC: -She has a history of right breast high-grade DCIS, 0.7 cm, close inferior margin, status post lumpectomy on 02/07/2017. - She was offered reresection and radiation which she declined.  She also declined tamoxifen. - Mammogram on 01/02/2018 showed group of fine linear calcifications measuring 1.4 x 1 x 1 cm just lateral to the lumpectomy site. - Biopsy of the site on 01/12/2018 shows high-grade DCIS with calcifications and necrosis with a  microscopic focus, ER/PR positive.  Left breast core needle biopsy showed fibrocystic changes. - She underwent right breast lumpectomy on 02/27/2018.  This showed a 0.2 cm invasive ductal carcinoma, grade 2, negative margins, no lymph nodes found.  ER/PR was positive.  Ki-67 was 10%.  HER-2 was 1+ positive. -We talked about the normal prognosis of stage I breast cancer.  We talked about starting her on antiestrogen therapy.  She has a history of severe osteoporosis.  Hence we recommended tamoxifen.  We discussed the side effects in detail.  She has declined radiation therapy.  We have sent a prescription for tamoxifen, which she will take for at least 5 years. - She will be seen back again in 4 months for follow-up.   2.  Osteoporosis: -Bone density test on 09/21/2017 shows a T score of -3.2 in the right femoral neck. -I have recommended Prolia in the past which she declined.  She is not taking calcium or vitamin D supplements. -We have checked her vitamin D which was low at 20.  I have recommended vitamin D 50,000 units weekly.  We plan to check her vitamin D level prior to next visit.  Total time spent is 40 minutes with more than 50% of the time spent face-to-face discussing new diagnosis, treatment options and coordination of care.    Orders placed this encounter:  Orders Placed This Encounter  Procedures  . Vitamin D 25 hydroxy  . CBC with Differential/Platelet  . Comprehensive metabolic panel      Derek Jack, MD [place] [phone]

## 2018-08-07 ENCOUNTER — Other Ambulatory Visit (HOSPITAL_COMMUNITY): Payer: Medicare Other

## 2018-08-08 ENCOUNTER — Ambulatory Visit (HOSPITAL_COMMUNITY): Payer: Medicare Other | Admitting: Hematology

## 2018-09-26 ENCOUNTER — Other Ambulatory Visit (HOSPITAL_COMMUNITY): Payer: Medicare Other

## 2018-09-26 ENCOUNTER — Ambulatory Visit (HOSPITAL_COMMUNITY): Payer: Medicare Other | Admitting: Hematology

## 2018-12-04 ENCOUNTER — Other Ambulatory Visit: Payer: Self-pay

## 2018-12-04 ENCOUNTER — Inpatient Hospital Stay (HOSPITAL_COMMUNITY): Payer: Medicare Other | Attending: Hematology

## 2018-12-04 DIAGNOSIS — D0511 Intraductal carcinoma in situ of right breast: Secondary | ICD-10-CM

## 2018-12-04 DIAGNOSIS — Z170 Estrogen receptor positive status [ER+]: Secondary | ICD-10-CM | POA: Insufficient documentation

## 2018-12-04 DIAGNOSIS — Z79899 Other long term (current) drug therapy: Secondary | ICD-10-CM | POA: Diagnosis not present

## 2018-12-04 DIAGNOSIS — C50411 Malignant neoplasm of upper-outer quadrant of right female breast: Secondary | ICD-10-CM | POA: Diagnosis not present

## 2018-12-04 DIAGNOSIS — M810 Age-related osteoporosis without current pathological fracture: Secondary | ICD-10-CM | POA: Insufficient documentation

## 2018-12-04 DIAGNOSIS — E559 Vitamin D deficiency, unspecified: Secondary | ICD-10-CM | POA: Diagnosis not present

## 2018-12-04 DIAGNOSIS — E785 Hyperlipidemia, unspecified: Secondary | ICD-10-CM | POA: Diagnosis not present

## 2018-12-04 DIAGNOSIS — I319 Disease of pericardium, unspecified: Secondary | ICD-10-CM | POA: Diagnosis not present

## 2018-12-04 LAB — CBC WITH DIFFERENTIAL/PLATELET
Abs Immature Granulocytes: 0.02 10*3/uL (ref 0.00–0.07)
Basophils Absolute: 0.1 10*3/uL (ref 0.0–0.1)
Basophils Relative: 1 %
Eosinophils Absolute: 0.2 10*3/uL (ref 0.0–0.5)
Eosinophils Relative: 2 %
HCT: 43.6 % (ref 36.0–46.0)
Hemoglobin: 14.4 g/dL (ref 12.0–15.0)
Immature Granulocytes: 0 %
Lymphocytes Relative: 48 %
Lymphs Abs: 3.9 10*3/uL (ref 0.7–4.0)
MCH: 31.6 pg (ref 26.0–34.0)
MCHC: 33 g/dL (ref 30.0–36.0)
MCV: 95.8 fL (ref 80.0–100.0)
Monocytes Absolute: 0.9 10*3/uL (ref 0.1–1.0)
Monocytes Relative: 11 %
Neutro Abs: 3.2 10*3/uL (ref 1.7–7.7)
Neutrophils Relative %: 38 %
Platelets: 340 10*3/uL (ref 150–400)
RBC: 4.55 MIL/uL (ref 3.87–5.11)
RDW: 13.2 % (ref 11.5–15.5)
WBC: 8.4 10*3/uL (ref 4.0–10.5)
nRBC: 0 % (ref 0.0–0.2)

## 2018-12-04 LAB — COMPREHENSIVE METABOLIC PANEL
ALT: 16 U/L (ref 0–44)
AST: 22 U/L (ref 15–41)
Albumin: 4 g/dL (ref 3.5–5.0)
Alkaline Phosphatase: 64 U/L (ref 38–126)
Anion gap: 8 (ref 5–15)
BUN: 10 mg/dL (ref 8–23)
CO2: 27 mmol/L (ref 22–32)
Calcium: 9.3 mg/dL (ref 8.9–10.3)
Chloride: 106 mmol/L (ref 98–111)
Creatinine, Ser: 0.73 mg/dL (ref 0.44–1.00)
GFR calc Af Amer: >60 mL/min (ref >60)
GFR calc non Af Amer: >60 mL/min (ref >60)
Glucose, Bld: 100 mg/dL — ABNORMAL HIGH (ref 70–99)
Potassium: 4.6 mmol/L (ref 3.5–5.1)
Sodium: 141 mmol/L (ref 135–145)
Total Bilirubin: 1 mg/dL (ref 0.3–1.2)
Total Protein: 7.1 g/dL (ref 6.5–8.1)

## 2018-12-05 LAB — VITAMIN D 25 HYDROXY (VIT D DEFICIENCY, FRACTURES): Vit D, 25-Hydroxy: 26 ng/mL — ABNORMAL LOW (ref 30.0–100.0)

## 2018-12-11 ENCOUNTER — Inpatient Hospital Stay (HOSPITAL_BASED_OUTPATIENT_CLINIC_OR_DEPARTMENT_OTHER): Payer: Medicare Other | Admitting: Hematology

## 2018-12-11 ENCOUNTER — Ambulatory Visit (HOSPITAL_COMMUNITY): Payer: Medicare Other | Admitting: Hematology

## 2018-12-11 ENCOUNTER — Other Ambulatory Visit: Payer: Self-pay

## 2018-12-11 ENCOUNTER — Encounter (HOSPITAL_COMMUNITY): Payer: Self-pay | Admitting: Hematology

## 2018-12-11 VITALS — BP 154/72 | HR 73 | Temp 98.2°F | Resp 16 | Wt 134.4 lb

## 2018-12-11 DIAGNOSIS — D0511 Intraductal carcinoma in situ of right breast: Secondary | ICD-10-CM | POA: Diagnosis not present

## 2018-12-11 DIAGNOSIS — C50411 Malignant neoplasm of upper-outer quadrant of right female breast: Secondary | ICD-10-CM | POA: Diagnosis not present

## 2018-12-11 DIAGNOSIS — Z170 Estrogen receptor positive status [ER+]: Secondary | ICD-10-CM

## 2018-12-11 NOTE — Patient Instructions (Signed)
[place] at [facility] Discharge Instructions  You were seen today by Dr. Delton Coombes. He went over your recent lab results. He will see you back in 4 months for labs and follow up.  Start taking 2,000 units of vitamin D daily. Thank you for choosing [place] at [name] to provide your oncology and hematology care.  To afford each patient quality time with our provider, please arrive at least 15 minutes before your scheduled appointment time.   If you have a lab appointment with the [place] please come in thru the  Main Entrance and check in at the main information desk  You need to re-schedule your appointment should you arrive 10 or more minutes late.  We strive to give you quality time with our providers, and arriving late affects you and other patients whose appointments are after yours.  Also, if you no show three or more times for appointments you may be dismissed from the clinic at the providers discretion.     Again, thank you for choosing [facility].  Our hope is that these requests will decrease the amount of time that you wait before being seen by our physicians.       _____________________________________________________________  Should you have questions after your visit to [facility], please contact our office at [phone] between the hours of 8:00 a.m. and 4:30 p.m.  Voicemails left after 4:00 p.m. will not be returned until the following business day.  For prescription refill requests, have your pharmacy contact our office and allow 72 hours.    Cancer Center Support Programs:   > Cancer Support Group  2nd Tuesday of the month 1pm-2pm, Journey Room

## 2018-12-11 NOTE — Assessment & Plan Note (Addendum)
1.  Stage I (PT1APNX) right breast infiltrating ductal carcinoma: - History of right breast DCIS, 0.7 cm, close inferior margin, status post lumpectomy on 02/07/2017. - She was offered reresection and radiation which she declined.  She also declined tamoxifen. - Mammogram on 01/02/2018 showed linear calcifications measuring 1.4 x 1 x 1.0 cm just lateral to the lumpectomy site. -Biopsy of the site on 01/12/2018 showed high-grade DCIS with calcifications and necrosis. -Right lumpectomy on 02/27/2018 showed 0.2 cm invasive ductal carcinoma, grade 2, negative margins, no lymph nodes found, ER/PR positive, Ki-67 10%, HER-2 1+ positive. - She was prescribed tamoxifen at last visit.  She read all the side effects and decided not to take it. - I had a prolonged discussion about the benefits of tamoxifen and the rarity of side effects.  However she is reluctant to consider it at this time. -We will schedule her for mammogram in August.  Breast exam today did not reveal any palpable masses.  Right breast lumpectomy site is within normal limits.  I will see her back in 4 months for follow-up.  I discussed the blood work with her.  2.  Osteoporosis: -Bone density test on 09/21/2017 shows T score of -3.2 in the right femoral neck. -I have recommended Prolia in the past which she declined.  She is not taking calcium or vitamin D supplements.  3.  Vitamin D deficiency: -She was recommended to take vitamin D 50,000 units weekly.  She did not start taking it. -Repeat vitamin D today is 26.  I have told her to take 2000 units of vitamin D daily.

## 2018-12-11 NOTE — Progress Notes (Signed)
[place], [place] [address]   CLINIC:  Medical Oncology/Hematology  PCP:  Asencion Noble, MD [address] [place] [address] [phone]   REASON FOR VISIT: Follow-up for DCIS   CURRENT THERAPY:  None   INTERVAL HISTORY:  Ms. Brunette 83 y.o. female seen for follow-up of breast cancer.  She reportedly read the side effects of tamoxifen and decided not to take it.  She has also not started vitamin D prescription pills.  Appetite and energy levels are 100%.  Denies any new onset pains.  Denies any nausea, vomiting, diarrhea or constipation.  No ER visits or hospitalizations.  No fevers, night sweats or weight loss.    REVIEW OF SYSTEMS:  Review of Systems  All other systems reviewed and are negative.    PAST MEDICAL/SURGICAL HISTORY:  Past Medical History:  Diagnosis Date  . Anxiety   . Arthritis   . Breast cancer ([place]) 2018  . Cancer [facility])    breast cancer  . Hyperlipidemia   . Osteoporosis   . Pericarditis 1961  . PONV (postoperative nausea and vomiting)   . Thyroiditis 1971   Past Surgical History:  Procedure Laterality Date  . ABDOMINAL HYSTERECTOMY    . ABDOMINAL SURGERY    . APPENDECTOMY    . BOWEL RESECTION  10/31/2011   Procedure: SMALL BOWEL RESECTION;  Surgeon: Donato Heinz, MD;  Location: [facility];  Service: General;;  . BREAST LUMPECTOMY Right   . BREAST LUMPECTOMY WITH RADIOACTIVE SEED LOCALIZATION Right 02/07/2017   Procedure: BREAST LUMPECTOMY WITH RADIOACTIVE SEED LOCALIZATION;  Surgeon: Rolm Bookbinder, MD;  Location: [place];  Service: General;  Laterality: Right;  . BREAST LUMPECTOMY WITH RADIOACTIVE SEED LOCALIZATION Right 02/27/2018   Procedure: RIGHT BREAST LUMPECTOMY WITH RADIOACTIVE SEED LOCALIZATION;  Surgeon: Rolm Bookbinder, MD;  Location: [place];  Service: General;  Laterality: Right;  . FISSURECTOMY    . 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 LAPAROTOMY   10/31/2011   Procedure: EXPLORATORY LAPAROTOMY;  Surgeon: Donato Heinz, MD;  Location: [facility];  Service: General;  Laterality: N/A;  . NASAL SEPTUM SURGERY  1990  . NASAL SINUS SURGERY    . TUBAL LIGATION       SOCIAL HISTORY:  Social History   Socioeconomic History  . Marital status: Widowed    Spouse name: Not on file  . Number of children: Not on file  . Years of education: Not on file  . Highest education level: Not on file  Occupational History  . Not on file  Social Needs  . Financial resource strain: Not on file  . Food insecurity    Worry: Not on file    Inability: Not on file  . Transportation needs    Medical: Not on file    Non-medical: Not on file  Tobacco Use  . Smoking status: Never Smoker  . Smokeless tobacco: Never Used  Substance and Sexual Activity  . Alcohol use: No  . Drug use: No  . Sexual activity: Never  Lifestyle  . Physical activity    Days per week: Not on file    Minutes per session: Not on file  . Stress: Not on file  Relationships  . Social Herbalist on phone: Not on file    Gets together: Not on file    Attends religious service: Not on file    Active member of club or organization:  Not on file    Attends meetings of clubs or organizations: Not on file    Relationship status: Not on file  . Intimate partner violence    Fear of current or ex partner: Not on file    Emotionally abused: Not on file    Physically abused: Not on file    Forced sexual activity: Not on file  Other Topics Concern  . Not on file  Social History Narrative  . Not on file    FAMILY HISTORY:  Family History  Problem Relation Age of Onset  . Diabetes Brother   . Hypertension Son   . Ulcers Sister     CURRENT MEDICATIONS:  Outpatient Encounter Medications as of 12/11/2018  Medication Sig  . acetaminophen (TYLENOL) 500 MG tablet Take 500 mg by mouth every 6 (six) hours as needed for moderate pain or headache.   Marland Kitchen LORazepam (ATIVAN) 1 MG  tablet Take 1 mg by mouth 3 (three) times daily.   . polyethylene glycol (MIRALAX / GLYCOLAX) packet Take 17 g by mouth every morning.  . [DISCONTINUED] Carboxymethylcellulose Sodium (THERATEARS OP) Place 2 drops into both eyes daily as needed (for dry eyes).   . [DISCONTINUED] ergocalciferol (VITAMIN D2) 1.25 MG (50000 UT) capsule Take 1 capsule (50,000 Units total) by mouth once a week.  . [DISCONTINUED] lovastatin (MEVACOR) 20 MG tablet Take 20 mg by mouth daily.  . tamoxifen (NOLVADEX) 20 MG tablet Take 1 tablet (20 mg total) by mouth daily. (Patient not taking: Reported on 12/11/2018)   No facility-administered encounter medications on file as of 12/11/2018.     ALLERGIES:  Allergies  Allergen Reactions  . Macrodantin [Nitrofurantoin Macrocrystal] Other (See Comments)    Unknown  . Promethazine Other (See Comments)    Hallucinations  . Sulfa Antibiotics Other (See Comments)    Whelps  . Suprax [Cefixime] Other (See Comments)    Unknown     PHYSICAL EXAM:  ECOG Performance status: 1  Vitals:   12/11/18 1400  BP: (!) 154/72  Pulse: 73  Resp: 16  Temp: 98.2 F (36.8 C)  SpO2: 94%   Filed Weights   12/11/18 1400  Weight: 134 lb 7 oz (61 kg)    Physical Exam Constitutional:      Appearance: Normal appearance. She is normal weight.  Musculoskeletal: Normal range of motion.  Skin:    General: Skin is warm and dry.  Neurological:     Mental Status: She is alert and oriented to person, place, and time. Mental status is at baseline.  Psychiatric:        Mood and Affect: Mood normal.        Behavior: Behavior normal.        Thought Content: Thought content normal.        Judgment: Judgment normal.   Breast:  Left: No palpable masses, no skin changes or nipple discharge, no adenopathy. Right: Lumpectomy site in the upper outer quadrant is well-healed.  No palpable masses in the right breast.  No palpable adenopathy.   LABORATORY DATA:  I have reviewed the labs as  listed.  CBC    Component Value Date/Time   WBC 8.4 12/04/2018 1050   RBC 4.55 12/04/2018 1050   HGB 14.4 12/04/2018 1050   HCT 43.6 12/04/2018 1050   PLT 340 12/04/2018 1050   MCV 95.8 12/04/2018 1050   MCH 31.6 12/04/2018 1050   MCHC 33.0 12/04/2018 1050   RDW 13.2 12/04/2018 1050   LYMPHSABS 3.9  12/04/2018 1050   MONOABS 0.9 12/04/2018 1050   EOSABS 0.2 12/04/2018 1050   BASOSABS 0.1 12/04/2018 1050   CMP Latest Ref Rng & Units 12/04/2018 03/26/2018 02/01/2017  Glucose 70 - 99 mg/dL 100(H) 74 89  BUN 8 - 23 mg/dL _0 Creatinine 0.44 - 1.00 mg/dL 0.73 0.69 0.79  Sodium 135 - 145 mmol/L 141 137 138  Potassium 3.5 - 5.1 mmol/L 4.6 4.3 4.7  Chloride 98 - 111 mmol/L 106 104 103  CO2 22 - 32 mmol/L _1 Calcium 8.9 - 10.3 mg/dL 9.3 9.2 9.5  Total Protein 6.5 - 8.1 g/dL 7.1 6.9 -  Total Bilirubin 0.3 - 1.2 mg/dL 1.0 0.4 -  Alkaline Phos 38 - 126 U/L 64 66 -  AST 15 - 41 U/L 22 21 -  ALT 0 - 44 U/L 16 16 -       DIAGNOSTIC IMAGING:  I have independently reviewed the scans and discussed with the patient.    ASSESSMENT & PLAN:   Breast cancer of upper-outer quadrant of right female breast ([place]) 1.  Stage I (PT1APNX) right breast infiltrating ductal carcinoma: - History of right breast DCIS, 0.7 cm, close inferior margin, status post lumpectomy on 02/07/2017. - She was offered reresection and radiation which she declined.  She also declined tamoxifen. - Mammogram on 01/02/2018 showed linear calcifications measuring 1.4 x 1 x 1.0 cm just lateral to the lumpectomy site. -Biopsy of the site on 01/12/2018 showed high-grade DCIS with calcifications and necrosis. -Right lumpectomy on 02/27/2018 showed 0.2 cm invasive ductal carcinoma, grade 2, negative margins, no lymph nodes found, ER/PR positive, Ki-67 10%, HER-2 1+ positive. - She was prescribed tamoxifen at last visit.  She read all the side effects and decided not to take it. - I had a prolonged discussion about the  benefits of tamoxifen and the rarity of side effects.  However she is reluctant to consider it at this time. -We will schedule her for mammogram in August.  Breast exam today did not reveal any palpable masses.  Right breast lumpectomy site is within normal limits.  I will see her back in 4 months for follow-up.  I discussed the blood work with her.  2.  Osteoporosis: -Bone density test on 09/21/2017 shows T score of -3.2 in the right femoral neck. -I have recommended Prolia in the past which she declined.  She is not taking calcium or vitamin D supplements.  3.  Vitamin D deficiency: -She was recommended to take vitamin D 50,000 units weekly.  She did not start taking it. -Repeat vitamin D today is 26.  I have told her to take 2000 units of vitamin D daily.  Total time spent is 40 minutes with more than 50% of the time spent face-to-face discussing treatment plan, counseling and coordination of care.    Orders placed this encounter:  Orders Placed This Encounter  Procedures  . MM DIAG BREAST TOMO BILATERAL  . CBC with Differential/Platelet  . Comprehensive metabolic panel  . Vitamin D 25 hydroxy      Derek Jack, MD [place] [phone]

## 2019-04-09 ENCOUNTER — Inpatient Hospital Stay (HOSPITAL_COMMUNITY): Payer: Medicare Other | Attending: Hematology

## 2019-04-09 ENCOUNTER — Other Ambulatory Visit: Payer: Self-pay

## 2019-04-09 DIAGNOSIS — M129 Arthropathy, unspecified: Secondary | ICD-10-CM | POA: Insufficient documentation

## 2019-04-09 DIAGNOSIS — M810 Age-related osteoporosis without current pathological fracture: Secondary | ICD-10-CM | POA: Insufficient documentation

## 2019-04-09 DIAGNOSIS — Z79899 Other long term (current) drug therapy: Secondary | ICD-10-CM | POA: Diagnosis not present

## 2019-04-09 DIAGNOSIS — Z170 Estrogen receptor positive status [ER+]: Secondary | ICD-10-CM | POA: Insufficient documentation

## 2019-04-09 DIAGNOSIS — D0511 Intraductal carcinoma in situ of right breast: Secondary | ICD-10-CM | POA: Diagnosis not present

## 2019-04-09 DIAGNOSIS — E785 Hyperlipidemia, unspecified: Secondary | ICD-10-CM | POA: Diagnosis not present

## 2019-04-09 DIAGNOSIS — E559 Vitamin D deficiency, unspecified: Secondary | ICD-10-CM | POA: Diagnosis not present

## 2019-04-09 DIAGNOSIS — F419 Anxiety disorder, unspecified: Secondary | ICD-10-CM | POA: Insufficient documentation

## 2019-04-09 LAB — COMPREHENSIVE METABOLIC PANEL
ALT: 22 U/L (ref 0–44)
AST: 24 U/L (ref 15–41)
Albumin: 4 g/dL (ref 3.5–5.0)
Alkaline Phosphatase: 71 U/L (ref 38–126)
Anion gap: 8 (ref 5–15)
BUN: 11 mg/dL (ref 8–23)
CO2: 27 mmol/L (ref 22–32)
Calcium: 9 mg/dL (ref 8.9–10.3)
Chloride: 101 mmol/L (ref 98–111)
Creatinine, Ser: 0.76 mg/dL (ref 0.44–1.00)
GFR calc Af Amer: >60 mL/min (ref >60)
GFR calc non Af Amer: >60 mL/min (ref >60)
Glucose, Bld: 112 mg/dL — ABNORMAL HIGH (ref 70–99)
Potassium: 4.3 mmol/L (ref 3.5–5.1)
Sodium: 136 mmol/L (ref 135–145)
Total Bilirubin: 0.5 mg/dL (ref 0.3–1.2)
Total Protein: 7.2 g/dL (ref 6.5–8.1)

## 2019-04-09 LAB — CBC WITH DIFFERENTIAL/PLATELET
Abs Immature Granulocytes: 0.04 10*3/uL (ref 0.00–0.07)
Basophils Absolute: 0.1 10*3/uL (ref 0.0–0.1)
Basophils Relative: 1 %
Eosinophils Absolute: 0.3 10*3/uL (ref 0.0–0.5)
Eosinophils Relative: 3 %
HCT: 43.7 % (ref 36.0–46.0)
Hemoglobin: 14.5 g/dL (ref 12.0–15.0)
Immature Granulocytes: 0 %
Lymphocytes Relative: 48 %
Lymphs Abs: 4.7 10*3/uL — ABNORMAL HIGH (ref 0.7–4.0)
MCH: 31.6 pg (ref 26.0–34.0)
MCHC: 33.2 g/dL (ref 30.0–36.0)
MCV: 95.2 fL (ref 80.0–100.0)
Monocytes Absolute: 1.4 10*3/uL — ABNORMAL HIGH (ref 0.1–1.0)
Monocytes Relative: 14 %
Neutro Abs: 3.4 10*3/uL (ref 1.7–7.7)
Neutrophils Relative %: 34 %
Platelets: 377 10*3/uL (ref 150–400)
RBC: 4.59 MIL/uL (ref 3.87–5.11)
RDW: 12.9 % (ref 11.5–15.5)
WBC: 9.9 10*3/uL (ref 4.0–10.5)
nRBC: 0 % (ref 0.0–0.2)

## 2019-04-10 LAB — VITAMIN D 25 HYDROXY (VIT D DEFICIENCY, FRACTURES): Vit D, 25-Hydroxy: 20.57 ng/mL — ABNORMAL LOW (ref 30–100)

## 2019-04-16 ENCOUNTER — Inpatient Hospital Stay (HOSPITAL_BASED_OUTPATIENT_CLINIC_OR_DEPARTMENT_OTHER): Payer: Medicare Other | Admitting: Hematology

## 2019-04-16 ENCOUNTER — Other Ambulatory Visit: Payer: Self-pay

## 2019-04-16 ENCOUNTER — Encounter (HOSPITAL_COMMUNITY): Payer: Self-pay | Admitting: Hematology

## 2019-04-16 VITALS — BP 144/77 | HR 67 | Temp 97.6°F | Resp 18 | Wt 134.5 lb

## 2019-04-16 DIAGNOSIS — C50411 Malignant neoplasm of upper-outer quadrant of right female breast: Secondary | ICD-10-CM

## 2019-04-16 DIAGNOSIS — Z170 Estrogen receptor positive status [ER+]: Secondary | ICD-10-CM

## 2019-04-16 DIAGNOSIS — D0511 Intraductal carcinoma in situ of right breast: Secondary | ICD-10-CM

## 2019-04-16 NOTE — Progress Notes (Signed)
[place], [place] [address]   CLINIC:  Medical Oncology/Hematology  PCP:  Asencion Noble, MD [address] [place] [address] [phone]   REASON FOR VISIT: Follow-up for DCIS   CURRENT THERAPY:  None   INTERVAL HISTORY:  Caitlin Alexander 84 y.o. female seen for follow-up of breast cancer.  Reports appetite 100% and energy level 75%.  No new pains reported.  She is not taking any vitamin D supplements.  Denies any fevers, night sweats or weight loss.    REVIEW OF SYSTEMS:  Review of Systems  All other systems reviewed and are negative.    PAST MEDICAL/SURGICAL HISTORY:  Past Medical History:  Diagnosis Date  . Anxiety   . Arthritis   . Breast cancer ([place]) 2018  . Cancer [facility])    breast cancer  . Hyperlipidemia   . Osteoporosis   . Pericarditis 1961  . PONV (postoperative nausea and vomiting)   . Thyroiditis 1971   Past Surgical History:  Procedure Laterality Date  . ABDOMINAL HYSTERECTOMY    . ABDOMINAL SURGERY    . APPENDECTOMY    . BOWEL RESECTION  10/31/2011   Procedure: SMALL BOWEL RESECTION;  Surgeon: Donato Heinz, MD;  Location: [facility];  Service: General;;  . BREAST LUMPECTOMY Right   . BREAST LUMPECTOMY WITH RADIOACTIVE SEED LOCALIZATION Right 02/07/2017   Procedure: BREAST LUMPECTOMY WITH RADIOACTIVE SEED LOCALIZATION;  Surgeon: Rolm Bookbinder, MD;  Location: [place];  Service: General;  Laterality: Right;  . BREAST LUMPECTOMY WITH RADIOACTIVE SEED LOCALIZATION Right 02/27/2018   Procedure: RIGHT BREAST LUMPECTOMY WITH RADIOACTIVE SEED LOCALIZATION;  Surgeon: Rolm Bookbinder, MD;  Location: [place];  Service: General;  Laterality: Right;  . FISSURECTOMY    . 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 LAPAROTOMY  10/31/2011   Procedure: EXPLORATORY LAPAROTOMY;  Surgeon: Donato Heinz, MD;  Location: [facility];  Service: General;  Laterality: N/A;  . NASAL SEPTUM SURGERY  1990  . NASAL  SINUS SURGERY    . TUBAL LIGATION       SOCIAL HISTORY:  Social History   Socioeconomic History  . Marital status: Widowed    Spouse name: Not on file  . Number of children: Not on file  . Years of education: Not on file  . Highest education level: Not on file  Occupational History  . Not on file  Tobacco Use  . Smoking status: Never Smoker  . Smokeless tobacco: Never Used  Substance and Sexual Activity  . Alcohol use: No  . Drug use: No  . Sexual activity: Never  Other Topics Concern  . Not on file  Social History Narrative  . Not on file   Social Determinants of Health   Financial Resource Strain:   . Difficulty of Paying Living Expenses: Not on file  Food Insecurity:   . Worried About Charity fundraiser in the Last Year: Not on file  . Ran Out of Food in the Last Year: Not on file  Transportation Needs:   . Lack of Transportation (Medical): Not on file  . Lack of Transportation (Non-Medical): Not on file  Physical Activity:   . Days of Exercise per Week: Not on file  . Minutes of Exercise per Session: Not on file  Stress:   . Feeling of Stress : Not on file  Social Connections:   . Frequency of Communication with Friends and Family: Not on file  .  Frequency of Social Gatherings with Friends and Family: Not on file  . Attends Religious Services: Not on file  . Active Member of Clubs or Organizations: Not on file  . Attends Archivist Meetings: Not on file  . Marital Status: Not on file  Intimate Partner Violence:   . Fear of Current or Ex-Partner: Not on file  . Emotionally Abused: Not on file  . Physically Abused: Not on file  . Sexually Abused: Not on file    FAMILY HISTORY:  Family History  Problem Relation Age of Onset  . Diabetes Brother   . Hypertension Son   . Ulcers Sister     CURRENT MEDICATIONS:  Outpatient Encounter Medications as of 04/16/2019  Medication Sig  . LORazepam (ATIVAN) 1 MG tablet Take 1 mg by mouth 3 (three)  times daily.   Marland Kitchen lovastatin (MEVACOR) 20 MG tablet   . polyethylene glycol (MIRALAX / GLYCOLAX) packet Take 17 g by mouth every morning.  . tamoxifen (NOLVADEX) 20 MG tablet Take 1 tablet (20 mg total) by mouth daily.  Marland Kitchen acetaminophen (TYLENOL) 500 MG tablet Take 500 mg by mouth every 6 (six) hours as needed for moderate pain or headache.    No facility-administered encounter medications on file as of 04/16/2019.    ALLERGIES:  Allergies  Allergen Reactions  . Macrodantin [Nitrofurantoin Macrocrystal] Other (See Comments)    Unknown  . Promethazine Other (See Comments)    Hallucinations  . Sulfa Antibiotics Other (See Comments)    Whelps  . Suprax [Cefixime] Other (See Comments)    Unknown     PHYSICAL EXAM:  ECOG Performance status: 1  Vitals:   04/16/19 1456  BP: (!) 144/77  Pulse: 67  Resp: 18  Temp: 97.6 F (36.4 C)  SpO2: 97%   Filed Weights   04/16/19 1456  Weight: 134 lb 8 oz (61 kg)    Physical Exam Constitutional:      Appearance: Normal appearance. She is normal weight.  Musculoskeletal:        General: Normal range of motion.  Skin:    General: Skin is warm and dry.  Neurological:     Mental Status: She is alert and oriented to person, place, and time. Mental status is at baseline.  Psychiatric:        Mood and Affect: Mood normal.        Behavior: Behavior normal.        Thought Content: Thought content normal.        Judgment: Judgment normal.   Breast:  Left: No palpable masses, no skin changes or nipple discharge, no adenopathy. Right: Lumpectomy site in the upper outer quadrant is well-healed.  No palpable masses in the right breast.  No palpable adenopathy.   LABORATORY DATA:  I have reviewed the labs as listed.  CBC    Component Value Date/Time   WBC 9.9 04/09/2019 1317   RBC 4.59 04/09/2019 1317   HGB 14.5 04/09/2019 1317   HCT 43.7 04/09/2019 1317   PLT 377 04/09/2019 1317   MCV 95.2 04/09/2019 1317   MCH 31.6 04/09/2019 1317     MCHC 33.2 04/09/2019 1317   RDW 12.9 04/09/2019 1317   LYMPHSABS 4.7 (H) 04/09/2019 1317   MONOABS 1.4 (H) 04/09/2019 1317   EOSABS 0.3 04/09/2019 1317   BASOSABS 0.1 04/09/2019 1317   CMP Latest Ref Rng & Units 04/09/2019 12/04/2018 03/26/2018  Glucose 70 - 99 mg/dL 112(H) 100(H) 74  BUN 8 -  23 mg/dL 11 10 9   Creatinine 0.44 - 1.00 mg/dL 0.76 0.73 0.69  Sodium 135 - 145 mmol/L 136 141 137  Potassium 3.5 - 5.1 mmol/L 4.3 4.6 4.3  Chloride 98 - 111 mmol/L 101 106 104  CO2 22 - 32 mmol/L 27 27 27   Calcium 8.9 - 10.3 mg/dL 9.0 9.3 9.2  Total Protein 6.5 - 8.1 g/dL 7.2 7.1 6.9  Total Bilirubin 0.3 - 1.2 mg/dL 0.5 1.0 0.4  Alkaline Phos 38 - 126 U/L 71 64 66  AST 15 - 41 U/L 24 22 21   ALT 0 - 44 U/L 22 16 16        DIAGNOSTIC IMAGING:  I have independently reviewed the scans and discussed with the patient.    ASSESSMENT & PLAN:   Breast cancer of upper-outer quadrant of right female breast ([place]) 1.  Stage I (PT1APNX) right breast infiltrating ductal carcinoma: - History of right breast DCIS, 0.7 cm, close inferior margin, status post lumpectomy on 02/07/2017. - She was offered reresection and radiation which she declined.  She also declined tamoxifen. - Mammogram on 01/02/2018 showed linear calcifications measuring 1.4 x 1 x 1.0 cm just lateral to the lumpectomy site. -Biopsy of the site on 01/12/2018 showed high-grade DCIS with calcifications and necrosis. -Right lumpectomy on 02/27/2018 showed 0.2 cm invasive ductal carcinoma, grade 2, negative margins, no lymph nodes found, ER/PR positive, Ki-67 10%, HER-2 1+ positive. -She declined tamoxifen therapy. -She did not have any mammogram done last year.  We will arrange her mammogram. -Physical examination shows no clear palpable masses.  There is some tenderness close to the right chest wall behind the areola. -We will schedule her for a phone visit after the mammogram.  Otherwise I will see her back in 4 months with repeat labs  including vitamin D.  2.  Osteoporosis: -Bone density test on 09/21/2017 shows T score of -3.2 in the right femoral neck. -We have recommended Prolia in the past and she declined.  3.  Vitamin D deficiency: -Vitamin D is 20.57.  She is not taking any supplements. -I will have told her to take vitamin D 2000 units daily.      Orders placed this encounter:  Orders Placed This Encounter  Procedures  . CBC with Differential/Platelet  . Comprehensive metabolic panel  . Vitamin D 25 hydroxy  . Cancer antigen 15-3      Derek Jack, MD [place] [phone]

## 2019-04-16 NOTE — Patient Instructions (Signed)
[place] at [facility] Discharge Instructions  You were seen today by Dr. Delton Coombes. He went over your recent lab results. He will schedule you for a repeat mammogram. He would like you to start taking 2,000 units daily of vitamin D. He will follow up with you by phone with your mammogram results. He will see you back in 4 months for labs and follow up.   Thank you for choosing [place] at [name] to provide your oncology and hematology care.  To afford each patient quality time with our provider, please arrive at least 15 minutes before your scheduled appointment time.   If you have a lab appointment with the [place] please come in thru the  Main Entrance and check in at the main information desk  You need to re-schedule your appointment should you arrive 10 or more minutes late.  We strive to give you quality time with our providers, and arriving late affects you and other patients whose appointments are after yours.  Also, if you no show three or more times for appointments you may be dismissed from the clinic at the providers discretion.     Again, thank you for choosing [facility].  Our hope is that these requests will decrease the amount of time that you wait before being seen by our physicians.       _____________________________________________________________  Should you have questions after your visit to [facility], please contact our office at [phone] between the hours of 8:00 a.m. and 4:30 p.m.  Voicemails left after 4:00 p.m. will not be returned until the following business day.  For prescription refill requests, have your pharmacy contact our office and allow 72 hours.    Cancer Center Support Programs:   > Cancer Support Group  2nd Tuesday of the month 1pm-2pm, Journey Room

## 2019-04-16 NOTE — Assessment & Plan Note (Signed)
1.  Stage I (PT1APNX) right breast infiltrating ductal carcinoma: - History of right breast DCIS, 0.7 cm, close inferior margin, status post lumpectomy on 02/07/2017. - She was offered reresection and radiation which she declined.  She also declined tamoxifen. - Mammogram on 01/02/2018 showed linear calcifications measuring 1.4 x 1 x 1.0 cm just lateral to the lumpectomy site. -Biopsy of the site on 01/12/2018 showed high-grade DCIS with calcifications and necrosis. -Right lumpectomy on 02/27/2018 showed 0.2 cm invasive ductal carcinoma, grade 2, negative margins, no lymph nodes found, ER/PR positive, Ki-67 10%, HER-2 1+ positive. -She declined tamoxifen therapy. -She did not have any mammogram done last year.  We will arrange her mammogram. -Physical examination shows no clear palpable masses.  There is some tenderness close to the right chest wall behind the areola. -We will schedule her for a phone visit after the mammogram.  Otherwise I will see her back in 4 months with repeat labs including vitamin D.  2.  Osteoporosis: -Bone density test on 09/21/2017 shows T score of -3.2 in the right femoral neck. -We have recommended Prolia in the past and she declined.  3.  Vitamin D deficiency: -Vitamin D is 20.57.  She is not taking any supplements. -I will have told her to take vitamin D 2000 units daily.

## 2019-04-18 ENCOUNTER — Other Ambulatory Visit (HOSPITAL_COMMUNITY): Payer: Self-pay | Admitting: Hematology

## 2019-04-18 DIAGNOSIS — Z9889 Other specified postprocedural states: Secondary | ICD-10-CM

## 2019-04-30 ENCOUNTER — Other Ambulatory Visit: Payer: Self-pay

## 2019-04-30 ENCOUNTER — Encounter (HOSPITAL_COMMUNITY): Payer: Self-pay | Admitting: *Deleted

## 2019-04-30 ENCOUNTER — Other Ambulatory Visit: Payer: Self-pay | Admitting: Hematology

## 2019-04-30 ENCOUNTER — Ambulatory Visit (HOSPITAL_COMMUNITY): Admission: RE | Admit: 2019-04-30 | Payer: Medicare Other | Source: Ambulatory Visit

## 2019-04-30 ENCOUNTER — Ambulatory Visit (HOSPITAL_COMMUNITY)
Admission: RE | Admit: 2019-04-30 | Discharge: 2019-04-30 | Disposition: A | Discharge status: Home / Self Care | Payer: Medicare Other | Source: Ambulatory Visit | Attending: Hematology | Admitting: Hematology

## 2019-04-30 ENCOUNTER — Ambulatory Visit (HOSPITAL_COMMUNITY): Payer: Medicare Other

## 2019-04-30 ENCOUNTER — Encounter (HOSPITAL_COMMUNITY): Payer: Medicare Other

## 2019-04-30 DIAGNOSIS — D0511 Intraductal carcinoma in situ of right breast: Secondary | ICD-10-CM | POA: Diagnosis present

## 2019-04-30 DIAGNOSIS — R921 Mammographic calcification found on diagnostic imaging of breast: Secondary | ICD-10-CM

## 2019-04-30 NOTE — Progress Notes (Signed)
I spoke with patient's daughter-in-law and advised of her mother-in-law's mammogram results from today and the need for biopsy.  I have her scheduled for 2/9 and she is to arrive at 0810.  She verbalizes understanding and states that she will have her there.

## 2019-05-01 ENCOUNTER — Encounter (HOSPITAL_COMMUNITY): Payer: Self-pay | Admitting: Hematology

## 2019-05-01 ENCOUNTER — Inpatient Hospital Stay (HOSPITAL_COMMUNITY): Payer: Medicare Other | Attending: Hematology | Admitting: Hematology

## 2019-05-01 DIAGNOSIS — C50411 Malignant neoplasm of upper-outer quadrant of right female breast: Secondary | ICD-10-CM

## 2019-05-01 DIAGNOSIS — Z170 Estrogen receptor positive status [ER+]: Secondary | ICD-10-CM

## 2019-05-01 NOTE — Progress Notes (Signed)
Virtual Visit via Telephone Note  I connected with Caitlin Alexander on 05/01/19 at  4:05 PM EST by telephone and verified that I am speaking with the correct person using two identifiers.   I discussed the limitations, risks, security and privacy concerns of performing an evaluation and management service by telephone and the availability of in person appointments. I also discussed with the patient that there may be a patient responsible charge related to this service. The patient expressed understanding and agreed to proceed.   History of Present Illness: She has a stage I right breast DCIS and underwent lumpectomy in 2018.  She had close inferior margin and declined reresection and radiation.  She also declined tamoxifen.   Observations/Objective: She had mammogram done on 04/30/2019.  Denies any new onset pains.  Assessment and Plan: I have discussed the findings on the mammogram dated 04/30/2019.  Suspicious 1.9 cm group of branching pleomorphic calcifications located posterior to the right breast lumpectomy site.  Suspicious 0.2 cm group of calcifications located slightly anteromedial to the right breast lumpectomy site. -She will be scheduled for biopsy on 11th.  Follow Up Instructions: I will see her back in 4 weeks to discuss results.   I discussed the assessment and treatment plan with the patient. The patient was provided an opportunity to ask questions and all were answered. The patient agreed with the plan and demonstrated an understanding of the instructions.   The patient was advised to call back or seek an in-person evaluation if the symptoms worsen or if the condition fails to improve as anticipated.  I provided 7 minutes of non-face-to-face time during this encounter.   Derek Jack, MD

## 2019-05-01 NOTE — Patient Instructions (Signed)
[facility] at [facility]  Discharge Instructions:  You had a phone visit with Dr. Katragadda today. _______________________________________________________________  Thank you for choosing [facility] at [facility] to provide your oncology and hematology care.  To afford each patient quality time with our providers, please arrive at least 15 minutes before your scheduled appointment.  You need to re-schedule your appointment if you arrive 10 or more minutes late.  We strive to give you quality time with our providers, and arriving late affects you and other patients whose appointments are after yours.  Also, if you no show three or more times for appointments you may be dismissed from the clinic.  Again, thank you for choosing [facility] at [facility]. Our hope is that these requests will allow you access to exceptional care and in a timely manner. _______________________________________________________________  If you have questions after your visit, please contact our office at [phone] between the hours of 8:30 a.m. and 5:00 p.m. Voicemails left after 4:30 p.m. will not be returned until the following business day. _______________________________________________________________  For prescription refill requests, have your pharmacy contact our office. _______________________________________________________________  Recommendations made by the consultant and any test results will be sent to your referring physician. _______________________________________________________________ 

## 2019-05-01 NOTE — Progress Notes (Signed)
Pt stopped taking Tamoxifen about 2 months ago due to nightmares.  Dr. Delton Coombes made aware.

## 2019-05-09 ENCOUNTER — Ambulatory Visit
Admission: RE | Admit: 2019-05-09 | Discharge: 2019-05-09 | Disposition: A | Discharge status: Home / Self Care | Payer: Medicare Other | Source: Ambulatory Visit | Attending: Hematology | Admitting: Hematology

## 2019-05-09 ENCOUNTER — Other Ambulatory Visit: Payer: Self-pay

## 2019-05-09 DIAGNOSIS — R921 Mammographic calcification found on diagnostic imaging of breast: Secondary | ICD-10-CM

## 2019-05-29 ENCOUNTER — Other Ambulatory Visit: Payer: Self-pay

## 2019-05-29 ENCOUNTER — Inpatient Hospital Stay (HOSPITAL_COMMUNITY): Payer: Medicare Other | Attending: Hematology | Admitting: Hematology

## 2019-05-29 DIAGNOSIS — Z170 Estrogen receptor positive status [ER+]: Secondary | ICD-10-CM | POA: Diagnosis not present

## 2019-05-29 DIAGNOSIS — C50411 Malignant neoplasm of upper-outer quadrant of right female breast: Secondary | ICD-10-CM

## 2019-05-29 NOTE — Progress Notes (Signed)
Virtual Visit via Telephone Note  I connected with Caitlin Alexander on 05/29/19 at  4:05 PM EST by telephone and verified that I am speaking with the correct person using two identifiers.   I discussed the limitations, risks, security and privacy concerns of performing an evaluation and management service by telephone and the availability of in person appointments. I also discussed with the patient that there may be a patient responsible charge related to this service. The patient expressed understanding and agreed to proceed.   History of Present Illness: She had right breast DCIS, close inferior margin status post lumpectomy 02/24/2017. 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  This showed 0.2 cm IDC, grade 2, ER/PR positive, Ki-67 10%, HER-2 negative.  She declined tamoxifen therapy.   Observations/Objective: She underwent biopsy of the right breast mass on 05/09/2019.  She also met with Dr. Donne Hazel on 05/23/2019 who discussed surgical options including lumpectomy versus mastectomy.  She is waiting to talk to Dr. Willey Blade about the options.  She denies any new onset pains.  She reports appetite and energy levels at 100%.  She lives by herself at home.  Assessment and Plan:  1.  Right breast infiltrating ductal carcinoma: -Biopsy of the right breast upper outer quadrant on 05/09/2019 shows microinvasive carcinoma, consistent with ductal carcinoma.  ER is 100%, PR 0%, Ki-67 is 2%.  HER-2 is negative. -She had previous lumpectomies in 2018 and 2019.  She had refused adjuvant tamoxifen for previous invasive cancer. -We talked about various options including lumpectomy and mastectomy.  I was slightly favoring mastectomy as she would refuse any form of adjuvant therapy. -However she is concerned about having to maintain the drain at home.  She would prefer her to be placed at rehab during that time. -She will talk to  Dr. Willey Blade on 06/03/2019 and make a decision.   Follow Up Instructions: Follow-up in 4 weeks after surgery.   I discussed the assessment and treatment plan with the patient. The patient was provided an opportunity to ask questions and all were answered. The patient agreed with the plan and demonstrated an understanding of the instructions.   The patient was advised to call back or seek an in-person evaluation if the symptoms worsen or if the condition fails to improve as anticipated.  I provided 15 minutes of non-face-to-face time during this encounter.   Derek Jack, MD

## 2019-06-03 ENCOUNTER — Telehealth (HOSPITAL_COMMUNITY): Payer: Self-pay | Admitting: *Deleted

## 2019-06-04 ENCOUNTER — Other Ambulatory Visit: Payer: Self-pay | Admitting: General Surgery

## 2019-06-07 ENCOUNTER — Other Ambulatory Visit (HOSPITAL_COMMUNITY)
Admission: RE | Admit: 2019-06-07 | Discharge: 2019-06-07 | Disposition: A | Discharge status: Home / Self Care | Payer: Medicare Other | Source: Ambulatory Visit | Attending: General Surgery | Admitting: General Surgery

## 2019-06-07 ENCOUNTER — Other Ambulatory Visit: Payer: Self-pay

## 2019-06-07 DIAGNOSIS — Z20822 Contact with and (suspected) exposure to covid-19: Secondary | ICD-10-CM | POA: Diagnosis not present

## 2019-06-07 DIAGNOSIS — Z01812 Encounter for preprocedural laboratory examination: Secondary | ICD-10-CM | POA: Insufficient documentation

## 2019-06-08 LAB — SARS CORONAVIRUS 2 (TAT 6-24 HRS): SARS Coronavirus 2: NEGATIVE

## 2019-06-10 ENCOUNTER — Encounter (HOSPITAL_COMMUNITY): Payer: Self-pay | Admitting: General Surgery

## 2019-06-10 ENCOUNTER — Other Ambulatory Visit: Payer: Self-pay

## 2019-06-10 NOTE — Progress Notes (Signed)
Spoke with pt for pre-op call. Pt denies cardiac history, Diabetes or HTN. Pt states she did see Dr. Willey Blade on 06/03/19 for her heart beating hard in her chest at night when she would lay down. She states that Dr. Willey Blade did an EKG and told her that her heart rate and rhythm looked fine. He felt that she may have some stress causing the feelings she was having. I called Dr. Ria Comment office and requested LOV notes and copy of the EKG to be faxed to [place].   Covid test was done on 06/07/19 and it is negative. She states she has been in quarantine since the test was done and understands she stays in quarantine until she comes to the hospital in the morning.   I reviewed the visitation policy with pt several times and she voiced understanding.

## 2019-06-11 ENCOUNTER — Ambulatory Visit (HOSPITAL_COMMUNITY): Payer: Medicare Other

## 2019-06-11 ENCOUNTER — Encounter (HOSPITAL_COMMUNITY): Payer: Self-pay | Admitting: General Surgery

## 2019-06-11 ENCOUNTER — Encounter (HOSPITAL_COMMUNITY)
Admission: RE | Disposition: A | Discharge status: Home / Self Care | Payer: Self-pay | Source: Home / Self Care | Attending: General Surgery

## 2019-06-11 ENCOUNTER — Other Ambulatory Visit: Payer: Self-pay | Admitting: Anatomic Pathology & Clinical Pathology

## 2019-06-11 ENCOUNTER — Observation Stay (HOSPITAL_COMMUNITY)
Admission: RE | Admit: 2019-06-11 | Discharge: 2019-06-12 | Disposition: A | Discharge status: Home / Self Care | Payer: Medicare Other | Attending: General Surgery | Admitting: General Surgery

## 2019-06-11 ENCOUNTER — Other Ambulatory Visit: Payer: Self-pay

## 2019-06-11 DIAGNOSIS — Z90710 Acquired absence of both cervix and uterus: Secondary | ICD-10-CM | POA: Insufficient documentation

## 2019-06-11 DIAGNOSIS — F419 Anxiety disorder, unspecified: Secondary | ICD-10-CM | POA: Diagnosis not present

## 2019-06-11 DIAGNOSIS — Z882 Allergy status to sulfonamides status: Secondary | ICD-10-CM | POA: Insufficient documentation

## 2019-06-11 DIAGNOSIS — D0511 Intraductal carcinoma in situ of right breast: Secondary | ICD-10-CM

## 2019-06-11 DIAGNOSIS — C773 Secondary and unspecified malignant neoplasm of axilla and upper limb lymph nodes: Secondary | ICD-10-CM | POA: Insufficient documentation

## 2019-06-11 DIAGNOSIS — Z881 Allergy status to other antibiotic agents status: Secondary | ICD-10-CM | POA: Diagnosis not present

## 2019-06-11 DIAGNOSIS — Z8249 Family history of ischemic heart disease and other diseases of the circulatory system: Secondary | ICD-10-CM | POA: Diagnosis not present

## 2019-06-11 DIAGNOSIS — Z833 Family history of diabetes mellitus: Secondary | ICD-10-CM | POA: Insufficient documentation

## 2019-06-11 DIAGNOSIS — Z170 Estrogen receptor positive status [ER+]: Secondary | ICD-10-CM | POA: Diagnosis not present

## 2019-06-11 DIAGNOSIS — C50919 Malignant neoplasm of unspecified site of unspecified female breast: Secondary | ICD-10-CM | POA: Diagnosis present

## 2019-06-11 DIAGNOSIS — Z79899 Other long term (current) drug therapy: Secondary | ICD-10-CM | POA: Diagnosis not present

## 2019-06-11 DIAGNOSIS — C50911 Malignant neoplasm of unspecified site of right female breast: Secondary | ICD-10-CM | POA: Diagnosis present

## 2019-06-11 DIAGNOSIS — C50411 Malignant neoplasm of upper-outer quadrant of right female breast: Secondary | ICD-10-CM | POA: Diagnosis not present

## 2019-06-11 DIAGNOSIS — M1990 Unspecified osteoarthritis, unspecified site: Secondary | ICD-10-CM | POA: Diagnosis not present

## 2019-06-11 DIAGNOSIS — Z880 Allergy status to penicillin: Secondary | ICD-10-CM | POA: Insufficient documentation

## 2019-06-11 HISTORY — PX: SIMPLE MASTECTOMY WITH AXILLARY SENTINEL NODE BIOPSY: SHX6098

## 2019-06-11 HISTORY — DX: Concussion with loss of consciousness status unknown, initial encounter: S06.0XAA

## 2019-06-11 HISTORY — DX: Concussion with loss of consciousness of unspecified duration, initial encounter: S06.0X9A

## 2019-06-11 LAB — CBC
HCT: 41.9 % (ref 36.0–46.0)
Hemoglobin: 13.8 g/dL (ref 12.0–15.0)
MCH: 31 pg (ref 26.0–34.0)
MCHC: 32.9 g/dL (ref 30.0–36.0)
MCV: 94.2 fL (ref 80.0–100.0)
Platelets: 340 10*3/uL (ref 150–400)
RBC: 4.45 MIL/uL (ref 3.87–5.11)
RDW: 13.3 % (ref 11.5–15.5)
WBC: 9.2 10*3/uL (ref 4.0–10.5)
nRBC: 0 % (ref 0.0–0.2)

## 2019-06-11 SURGERY — SIMPLE MASTECTOMY
Anesthesia: Regional | Site: Breast | Laterality: Right

## 2019-06-11 MED ORDER — OXYCODONE HCL 5 MG/5ML PO SOLN: 5.0000 mg | Freq: Once | ORAL | Status: AC | PRN

## 2019-06-11 MED ORDER — ROPIVACAINE HCL 7.5 MG/ML IJ SOLN
INTRAMUSCULAR | Status: DC | PRN
  Administered 2019-06-11: 20 mL via PERINEURAL

## 2019-06-11 MED ORDER — SIMETHICONE 80 MG PO CHEW: 40.0000 mg | CHEWABLE_TABLET | Freq: Four times a day (QID) | ORAL | Status: DC | PRN

## 2019-06-11 MED ORDER — ONDANSETRON HCL 4 MG/2ML IJ SOLN
INTRAMUSCULAR | Status: AC
  Filled 2019-06-11: qty 2

## 2019-06-11 MED ORDER — POLYETHYLENE GLYCOL 3350 17 G PO PACK: 17.0000 g | PACK | Freq: Every day | ORAL | Status: DC

## 2019-06-11 MED ORDER — 0.9 % SODIUM CHLORIDE (POUR BTL) OPTIME
TOPICAL | Status: DC | PRN
  Administered 2019-06-11: 1000 mL

## 2019-06-11 MED ORDER — GABAPENTIN 100 MG PO CAPS
100.0000 mg | ORAL_CAPSULE | ORAL | Status: AC
  Administered 2019-06-11: 100 mg via ORAL
  Filled 2019-06-11: qty 1

## 2019-06-11 MED ORDER — PROPOFOL 500 MG/50ML IV EMUL
INTRAVENOUS | Status: DC | PRN
  Administered 2019-06-11: 100 ug/kg/min via INTRAVENOUS

## 2019-06-11 MED ORDER — ACETAMINOPHEN 160 MG/5ML PO SOLN: 1000.0000 mg | Freq: Once | ORAL | Status: DC | PRN

## 2019-06-11 MED ORDER — FENTANYL CITRATE (PF) 250 MCG/5ML IJ SOLN
INTRAMUSCULAR | Status: AC
  Filled 2019-06-11: qty 5

## 2019-06-11 MED ORDER — FENTANYL CITRATE (PF) 100 MCG/2ML IJ SOLN
INTRAMUSCULAR | Status: AC
  Filled 2019-06-11: qty 2

## 2019-06-11 MED ORDER — ENSURE PRE-SURGERY PO LIQD: 296.0000 mL | Freq: Once | ORAL | Status: DC

## 2019-06-11 MED ORDER — FENTANYL CITRATE (PF) 100 MCG/2ML IJ SOLN
25.0000 ug | INTRAMUSCULAR | Status: DC | PRN
  Administered 2019-06-11 (×2): 50 ug via INTRAVENOUS

## 2019-06-11 MED ORDER — DEXAMETHASONE SODIUM PHOSPHATE 10 MG/ML IJ SOLN
INTRAMUSCULAR | Status: AC
  Filled 2019-06-11: qty 1

## 2019-06-11 MED ORDER — OXYCODONE HCL 5 MG PO TABS
ORAL_TABLET | ORAL | Status: AC
  Filled 2019-06-11: qty 1

## 2019-06-11 MED ORDER — SODIUM CHLORIDE 0.9 % IV SOLN: INTRAVENOUS | Status: DC

## 2019-06-11 MED ORDER — ONDANSETRON HCL 4 MG/2ML IJ SOLN
INTRAMUSCULAR | Status: DC | PRN
  Administered 2019-06-11: 4 mg via INTRAVENOUS

## 2019-06-11 MED ORDER — CIPROFLOXACIN IN D5W 400 MG/200ML IV SOLN
400.0000 mg | INTRAVENOUS | Status: AC
  Administered 2019-06-11: 400 mg via INTRAVENOUS
  Filled 2019-06-11: qty 200

## 2019-06-11 MED ORDER — OXYCODONE HCL 5 MG PO TABS
5.0000 mg | ORAL_TABLET | Freq: Once | ORAL | Status: AC | PRN
  Administered 2019-06-11: 5 mg via ORAL

## 2019-06-11 MED ORDER — ACETAMINOPHEN 500 MG PO TABS
1000.0000 mg | ORAL_TABLET | ORAL | Status: AC
  Administered 2019-06-11: 1000 mg via ORAL
  Filled 2019-06-11: qty 2

## 2019-06-11 MED ORDER — METHOCARBAMOL 500 MG PO TABS
500.0000 mg | ORAL_TABLET | Freq: Four times a day (QID) | ORAL | Status: DC | PRN
  Filled 2019-06-11: qty 1

## 2019-06-11 MED ORDER — LIDOCAINE 2% (20 MG/ML) 5 ML SYRINGE
INTRAMUSCULAR | Status: DC | PRN
  Administered 2019-06-11: 40 mg via INTRAVENOUS

## 2019-06-11 MED ORDER — PROPOFOL 10 MG/ML IV BOLUS
INTRAVENOUS | Status: AC
  Filled 2019-06-11: qty 40

## 2019-06-11 MED ORDER — PROPOFOL 10 MG/ML IV BOLUS
INTRAVENOUS | Status: DC | PRN
  Administered 2019-06-11: 20 mg via INTRAVENOUS
  Administered 2019-06-11: 30 mg via INTRAVENOUS
  Administered 2019-06-11: 150 mg via INTRAVENOUS
  Administered 2019-06-11: 20 mg via INTRAVENOUS

## 2019-06-11 MED ORDER — ACETAMINOPHEN 500 MG PO TABS: 1000.0000 mg | ORAL_TABLET | Freq: Once | ORAL | Status: DC | PRN

## 2019-06-11 MED ORDER — MORPHINE SULFATE (PF) 2 MG/ML IV SOLN: 1.0000 mg | INTRAVENOUS | Status: DC | PRN

## 2019-06-11 MED ORDER — LIDOCAINE 2% (20 MG/ML) 5 ML SYRINGE
INTRAMUSCULAR | Status: AC
  Filled 2019-06-11: qty 5

## 2019-06-11 MED ORDER — ACETAMINOPHEN 10 MG/ML IV SOLN: 1000.0000 mg | Freq: Once | INTRAVENOUS | Status: DC | PRN

## 2019-06-11 MED ORDER — ONDANSETRON 4 MG PO TBDP: 4.0000 mg | ORAL_TABLET | Freq: Four times a day (QID) | ORAL | Status: DC | PRN

## 2019-06-11 MED ORDER — ROPIVACAINE HCL 5 MG/ML IJ SOLN
INTRAMUSCULAR | Status: DC | PRN
  Administered 2019-06-11: 10 mL via PERINEURAL

## 2019-06-11 MED ORDER — ONDANSETRON HCL 4 MG/2ML IJ SOLN: 4.0000 mg | Freq: Four times a day (QID) | INTRAMUSCULAR | Status: DC | PRN

## 2019-06-11 MED ORDER — LORAZEPAM 1 MG PO TABS
1.0000 mg | ORAL_TABLET | Freq: Three times a day (TID) | ORAL | Status: DC | PRN
  Administered 2019-06-11 – 2019-06-12 (×2): 1 mg via ORAL
  Filled 2019-06-11 (×2): qty 1

## 2019-06-11 MED ORDER — TRAMADOL HCL 50 MG PO TABS
50.0000 mg | ORAL_TABLET | Freq: Four times a day (QID) | ORAL | Status: DC | PRN
  Administered 2019-06-12 (×2): 50 mg via ORAL
  Filled 2019-06-11 (×2): qty 1

## 2019-06-11 MED ORDER — DEXAMETHASONE SODIUM PHOSPHATE 4 MG/ML IJ SOLN
INTRAMUSCULAR | Status: DC | PRN
  Administered 2019-06-11: 8 mg via INTRAVENOUS

## 2019-06-11 MED ORDER — ACETAMINOPHEN 500 MG PO TABS
1000.0000 mg | ORAL_TABLET | Freq: Four times a day (QID) | ORAL | Status: DC
  Administered 2019-06-11 – 2019-06-12 (×2): 1000 mg via ORAL
  Filled 2019-06-11 (×3): qty 2

## 2019-06-11 MED ORDER — POLYETHYLENE GLYCOL 3350 17 G PO PACK
17.0000 g | PACK | Freq: Every day | ORAL | Status: DC
  Administered 2019-06-11 – 2019-06-12 (×2): 17 g via ORAL
  Filled 2019-06-11 (×3): qty 1

## 2019-06-11 MED ORDER — LACTATED RINGERS IV SOLN: INTRAVENOUS | Status: DC

## 2019-06-11 SURGICAL SUPPLY — 42 items
ADH SKN CLS APL DERMABOND .7 (GAUZE/BANDAGES/DRESSINGS)
APL PRP STRL LF DISP 70% ISPRP (MISCELLANEOUS) ×1
APPLIER CLIP 11 MED OPEN (CLIP) ×3
APR CLP MED 11 20 MLT OPN (CLIP) ×1
BINDER BREAST LRG (GAUZE/BANDAGES/DRESSINGS) IMPLANT
BINDER BREAST XLRG (GAUZE/BANDAGES/DRESSINGS) ×2 IMPLANT
BIOPATCH RED 1 DISK 7.0 (GAUZE/BANDAGES/DRESSINGS) ×1 IMPLANT
BIOPATCH RED 1IN DISK 7.0MM (GAUZE/BANDAGES/DRESSINGS) ×1
CHLORAPREP W/TINT 26 (MISCELLANEOUS) ×3 IMPLANT
CLIP APPLIE 11 MED OPEN (CLIP) IMPLANT
CLOSURE WOUND 1/2 X4 (GAUZE/BANDAGES/DRESSINGS) ×1
COVER SURGICAL LIGHT HANDLE (MISCELLANEOUS) ×3 IMPLANT
COVER WAND RF STERILE (DRAPES) ×3 IMPLANT
DERMABOND ADVANCED (GAUZE/BANDAGES/DRESSINGS)
DERMABOND ADVANCED .7 DNX12 (GAUZE/BANDAGES/DRESSINGS) ×1 IMPLANT
DRAIN CHANNEL 19F RND (DRAIN) ×3 IMPLANT
DRAPE LAPAROSCOPIC ABDOMINAL (DRAPES) ×3 IMPLANT
DRSG PAD ABDOMINAL 8X10 ST (GAUZE/BANDAGES/DRESSINGS) ×2 IMPLANT
DRSG TEGADERM 4X4.75 (GAUZE/BANDAGES/DRESSINGS) ×2 IMPLANT
ELECT CAUTERY BLADE 6.4 (BLADE) ×2 IMPLANT
ELECT REM PT RETURN 9FT ADLT (ELECTROSURGICAL) ×3
ELECTRODE REM PT RTRN 9FT ADLT (ELECTROSURGICAL) ×1 IMPLANT
EVACUATOR SILICONE 100CC (DRAIN) ×3 IMPLANT
GAUZE SPONGE 4X4 12PLY STRL (GAUZE/BANDAGES/DRESSINGS) ×3 IMPLANT
GLOVE BIO SURGEON STRL SZ7 (GLOVE) ×3 IMPLANT
GLOVE BIOGEL PI IND STRL 7.5 (GLOVE) ×1 IMPLANT
GLOVE BIOGEL PI INDICATOR 7.5 (GLOVE) ×2
GOWN STRL REUS W/ TWL LRG LVL3 (GOWN DISPOSABLE) ×3 IMPLANT
GOWN STRL REUS W/TWL LRG LVL3 (GOWN DISPOSABLE) ×9
KIT BASIN OR (CUSTOM PROCEDURE TRAY) ×3 IMPLANT
KIT TURNOVER KIT B (KITS) ×3 IMPLANT
NS IRRIG 1000ML POUR BTL (IV SOLUTION) ×3 IMPLANT
PACK GENERAL/GYN (CUSTOM PROCEDURE TRAY) ×3 IMPLANT
PAD ARMBOARD 7.5X6 YLW CONV (MISCELLANEOUS) ×3 IMPLANT
PENCIL SMOKE EVACUATOR (MISCELLANEOUS) ×3 IMPLANT
STRIP CLOSURE SKIN 1/2X4 (GAUZE/BANDAGES/DRESSINGS) ×1 IMPLANT
SUT ETHILON 2 0 FS 18 (SUTURE) ×3 IMPLANT
SUT MON AB 4-0 PC3 18 (SUTURE) ×3 IMPLANT
SUT VIC AB 2-0 SH 18 (SUTURE) ×3 IMPLANT
SUT VIC AB 3-0 SH 8-18 (SUTURE) ×3 IMPLANT
TOWEL GREEN STERILE (TOWEL DISPOSABLE) ×3 IMPLANT
TOWEL GREEN STERILE FF (TOWEL DISPOSABLE) ×3 IMPLANT

## 2019-06-11 NOTE — Anesthesia Procedure Notes (Signed)
Procedure Name: LMA Insertion Date/Time: 06/11/2019 9:37 AM Performed by: Leonor Liv, CRNA Pre-anesthesia Checklist: Patient identified, Emergency Drugs available, Suction available and Patient being monitored Patient Re-evaluated:Patient Re-evaluated prior to induction Oxygen Delivery Method: Circle System Utilized Preoxygenation: Pre-oxygenation with 100% oxygen Induction Type: IV induction Ventilation: Mask ventilation without difficulty LMA: LMA inserted LMA Size: 4.0 Number of attempts: 1 Airway Equipment and Method: Bite block Placement Confirmation: positive ETCO2 Tube secured with: Tape Dental Injury: Teeth and Oropharynx as per pre-operative assessment

## 2019-06-11 NOTE — Transfer of Care (Signed)
Immediate Anesthesia Transfer of Care Note  Patient: Caitlin Alexander  Procedure(s) Performed: RIGHT MASTECTOMY (Right Breast)  Patient Location: PACU  Anesthesia Type:General  Level of Consciousness: drowsy  Airway & Oxygen Therapy: Patient Spontanous Breathing and Patient connected to face mask oxygen  Post-op Assessment: Report given to RN and Post -op Vital signs reviewed and stable  Post vital signs: Reviewed and stable  Last Vitals:  Vitals Value Taken Time  BP 119/60 06/11/19 1102  Temp 36.5 C 06/11/19 1100  Pulse 53 06/11/19 1112  Resp 18 06/11/19 1112  SpO2 100 % 06/11/19 1112  Vitals shown include unvalidated device data.  Last Pain:  Vitals:   06/11/19 0822  TempSrc:   PainSc: 0-No pain      Patients Stated Pain Goal: 3 ([id number])  Complications: No apparent anesthesia complications

## 2019-06-11 NOTE — Anesthesia Procedure Notes (Addendum)
Anesthesia Regional Block: Pectoralis block   Pre-Anesthetic Checklist: ,, timeout performed, Correct Patient, Correct Site, Correct Laterality, Correct Procedure, Correct Position, site marked, Risks and benefits discussed,  Surgical consent,  Pre-op evaluation,  At surgeon's request and post-op pain management  Laterality: Right  Prep: chloraprep       Needles:  Injection technique: Single-shot     Needle Length: 9cm  Needle Gauge: 22     Additional Needles: Arrow StimuQuik ECHO Echogenic Stimulating PNB Needle  Procedures:,,,, ultrasound used (permanent image in chart),,,,  Narrative:  Start time: 06/11/2019 9:33 AM End time: 06/11/2019 9:38 AM Injection made incrementally with aspirations every 5 mL.  Performed by: Personally  Anesthesiologist: Oleta Mouse, MD

## 2019-06-11 NOTE — Anesthesia Preprocedure Evaluation (Signed)
Anesthesia Evaluation  Patient identified by MRN, date of birth, ID band Patient awake    Reviewed: Allergy & Precautions, NPO status , Patient's Chart, lab work & pertinent test results  History of Anesthesia Complications (+) PONV and history of anesthetic complications  Airway Mallampati: III  TM Distance: >3 FB Neck ROM: Full    Dental  (+) Dental Advisory Given, Teeth Intact   Pulmonary neg pulmonary ROS, neg recent URI,    breath sounds clear to auscultation       Cardiovascular negative cardio ROS   Rhythm:Regular     Neuro/Psych PSYCHIATRIC DISORDERS Anxiety negative neurological ROS     GI/Hepatic negative GI ROS, Neg liver ROS,   Endo/Other  negative endocrine ROS  Renal/GU negative Renal ROS     Musculoskeletal  (+) Arthritis ,   Abdominal   Peds  Hematology negative hematology ROS (+)   Anesthesia Other Findings   Reproductive/Obstetrics                             Anesthesia Physical Anesthesia Plan  ASA: II  Anesthesia Plan: General and Regional   Post-op Pain Management:  Regional for Post-op pain   Induction: Intravenous  PONV Risk Score and Plan: 4 or greater and Ondansetron, Dexamethasone, Propofol infusion and TIVA  Airway Management Planned: LMA  Additional Equipment: None  Intra-op Plan:   Post-operative Plan: Extubation in OR  Informed Consent: I have reviewed the patients History and Physical, chart, labs and discussed the procedure including the risks, benefits and alternatives for the proposed anesthesia with the patient or authorized representative who has indicated his/her understanding and acceptance.     Dental advisory given  Plan Discussed with: Surgeon and CRNA  Anesthesia Plan Comments:         Anesthesia Quick Evaluation

## 2019-06-11 NOTE — Interval H&P Note (Signed)
History and Physical Interval Note:  06/11/2019 9:00 AM  Caitlin Alexander  has presented today for surgery, with the diagnosis of RIGHT BREAST CANCER.  The various methods of treatment have been discussed with the patient and family. After consideration of risks, benefits and other options for treatment, the patient has consented to  Procedure(s): RIGHT MASTECTOMY (Right) as a surgical intervention.  The patient's history has been reviewed, patient examined, no change in status, stable for surgery.  I have reviewed the patient's chart and labs.  Questions were answered to the patient's satisfaction.     Rolm Bookbinder

## 2019-06-11 NOTE — H&P (Signed)
  5 yof s/p right breast seed guided lumpectomy for recurrent disease after no adjuvant therapy. she is doing well. path is IDC grade 2, 2 mm with hg dcis. one margin close. she returns today doing well from surgery but she has seen Dr Raliegh Ip and was prescribed tamoxifen. she then went to pick up. the pharmacist then gave her list of side effects without any discussion that have made her not willing to take medication. some of these I have never heard of related to tamoxifen. she now does not want to pursue adjuvant therapy. she has no symptoms but then had another mm that shows c density breasts. she has 1.9 cm of new calcs in ruoq and then 2-3 similar calcs in the lumpectomy site. left is negative. she had both these areas biopsied and the lumpectomy site is dense fibrosis, concordant and the other is microinvasive ductal carcinoma that is er pos, pr negative and Ki is 2%, her 2 negative. she is here to discus options   Past Surgical History Rolm Bookbinder, MD; 05/23/2019 8:12 PM) Anal Fissure Repair  Appendectomy  Breast Biopsy  Right. Hysterectomy (not due to cancer) - Complete  Pancreas Surgery  Resection of Small Bowel   Allergies (Tanisha A. Owens Shark, [place]; 05/23/2019 10:40 AM) Sulfa Antibiotics  Dermatitis, Diarrhea. Phenergan *ANTIHISTAMINES*  Allergies Reconciled   Medication History (Tanisha A. Owens Shark, [place]; 05/23/2019 10:40 AM) Lovastatin (20MG  Tablet, Oral) Active. Tylenol (500MG  Capsule, Oral) Active. MiraLax (Oral) Active. Theratears (0.25% Solution, Ophthalmic) Active. LORazepam (1MG  Tablet, Oral) Active. Medications Reconciled  Social History Rolm Bookbinder, MD; 05/23/2019 8:12 PM) Caffeine use  Carbonated beverages, Coffee, Tea. No alcohol use  No drug use  Tobacco use  Never smoker.  Family History Rolm Bookbinder, MD; 05/23/2019 8:12 PM) Diabetes Mellitus  Brother. Hypertension  Mother, Sister, Son.  Vitals Yvetta Coder A. Brown RMA;  05/23/2019 10:40 AM) 05/23/2019 10:40 AM Weight: 133.4 lb Height: 63in Body Surface Area: 1.63 m Body Mass Index: 23.63 kg/m  Temp.: 97.34F  Pulse: 90 (Regular)  BP: 126/82 (Sitting, Left Arm, Standard)  Physical Exam Rolm Bookbinder MD; 05/23/2019 3:56 PM) General Mental Status-Alert. Orientation-Oriented X3. Breast Nipples-No Discharge. Breast Lump-No Palpable Breast Mass. Lymphatic Head & Neck General Head & Neck Lymphatics: Bilateral - Description - Normal. Axillary General Axillary Region: Bilateral - Description - Normal. Note: no [place] adenopathy cv rrr Lungs clear   Assessment & Plan Rolm Bookbinder MD; 05/23/2019 8:11 PM) BREAST CANCER OF UPPER-OUTER QUADRANT OF RIGHT FEMALE BREAST (C50.411) Story: this will be third breast surgery and she has not agreed to any adjuvant therapy at any time. I think she likely could technically undergo another lumpectomy but I think that mastectomy might be better due to her reluctance to undergo adjuvant therapy. We will plan on mastectomy now.  Will forgo node biopsy given age and other issues as well as no change in adjuvant therapy.

## 2019-06-11 NOTE — Op Note (Signed)
Preoperative diagnosis: Clinical stage I right breast cancer Postoperative diagnosis: Same as above Procedure: Right mastectomy Surgeon: Dr. Serita Grammes Anesthesia: General with a pectoral block Estimated blood loss: 50 cc Specimens: Right breast marked short superior, long lateral Drains: 19 [place] Blake drain Complications: None Sponge and count was correct completion This patient to recovery stable condition  Indications: This is an 84 year old female who I know from 2 prior lumpectomies who has refused adjuvant therapy and mastectomy in the past.  She has a new area of calcifications found that are 1.9 cm.  She had this biopsied and is a microinvasive ductal carcinoma that is estrogen receptor positive.  We discussed all her options and she is agreed to complete a mastectomy at this point.  Procedure: After informed consent was obtained the patient was given a pectoral block.  She was given antibiotics.  SCDs were in place.  She was placed under anesthesia without complication.  She was prepped and draped in a standard sterile surgical fashion.  Surgical timeout was then performed.  I made an elliptical incision encompassing the nipple and areola and removing a fair amount of skin.  I then created flaps to the clavicle, parasternal region, inframammary fold, and latissimus laterally.  I then remove the breast and the pectoralis fashion from the muscle.  This was then marked as above and passed off the table.  Hemostasis was then obtained.  I placed a 19 [place] Blake drain and secured this with a 2-0 nylon suture.  I then sutured the lateral skin to the chest wall with a 2-0 Vicryl suture.  I then closed the dermis with a 3-0 Vicryl.  The skin was closed with a 4-0 Monocryl, glue and Steri-Strips.  She tolerated this well was extubated and transferred to recovery room in stable condition.

## 2019-06-11 NOTE — Discharge Instructions (Signed)
[facility] °[phone] ° °MASTECTOMY: POST OP INSTRUCTIONS °Take 400 mg of ibuprofen every 8 hours or 650 mg tylenol every 6 hours for next 72 hours then as needed. Use ice several times daily also. °Always review your discharge instruction sheet given to you by the facility where your surgery was performed. ° °IF YOU HAVE DISABILITY OR FAMILY LEAVE FORMS, YOU MUST BRING THEM TO THE OFFICE FOR PROCESSING.   °DO NOT GIVE THEM TO YOUR DOCTOR. °A prescription for pain medication may be given to you upon discharge.  Take your pain medication as prescribed, if needed.  If narcotic pain medicine is not needed, then you may take acetaminophen (Tylenol), naprosyn (Alleve) or ibuprofen (Advil) as needed. °1. Take your usually prescribed medications unless otherwise directed. °2. If you need a refill on your pain medication, please contact your pharmacy.  They will contact our office to request authorization.  Prescriptions will not be filled after 5pm or on week-ends. °3. You should follow a light diet the first few days after arrival home, such as soup and crackers, etc.  Resume your normal diet the day after surgery. °4. Most patients will experience some swelling and bruising on the chest and underarm.  Ice packs will help.  Swelling and bruising can take several days to resolve. Wear the binder day and night until you return to the office.  °5. It is common to experience some constipation if taking pain medication after surgery.  Increasing fluid intake and taking a stool softener (such as Colace) will usually help or prevent this problem from occurring.  A mild laxative (Milk of Magnesia or Miralax) should be taken according to package instructions if there are no bowel movements after 48 hours. °6. Unless discharge instructions indicate otherwise, leave your bandage dry and in place until your next appointment in 3-5 days.  You may take a limited sponge bath.  No tube baths or showers until the  drains are removed.  You may have steri-strips (small skin tapes) in place directly over the incision.  These strips should be left on the skin for 7-10 days. If you have glue it will come off in next couple week.  Any sutures will be removed at an office visit °7. DRAINS:  If you have drains in place, it is important to keep a list of the amount of drainage produced each day in your drains.  Before leaving the hospital, you should be instructed on drain care.  Call our office if you have any questions about your drains. I will remove your drains when they put out less than 30 cc or ml for 2 consecutive days. °8. ACTIVITIES:  You may resume regular (light) daily activities beginning the next day--such as daily self-care, walking, climbing stairs--gradually increasing activities as tolerated.  You may have sexual intercourse when it is comfortable.  Refrain from any heavy lifting or straining until approved by your doctor. °a. You may drive when you are no longer taking prescription pain medication, you can comfortably wear a seatbelt, and you can safely maneuver your car and apply brakes. °b. RETURN TO WORK:  __________________________________________________________ °9. You should see your doctor in the office for a follow-up appointment approximately 3-5 days after your surgery.  Your doctor’s nurse will typically make your follow-up appointment when she calls you with your pathology report.  Expect your pathology report 3-4business days after surgery. °10. OTHER INSTRUCTIONS: ______________________________________________________________________________________________ ____________________________________________________________________________________________ °WHEN TO CALL YOUR DR Deirdra Heumann: °1. Fever over 101.0 °  2. Nausea and/or vomiting °3. Extreme swelling or bruising °4. Continued bleeding from incision. °5. Increased pain, redness, or drainage from the incision. °The clinic staff is available to answer your  questions during regular business hours.  Please don’t hesitate to call and ask to speak to one of the nurses for clinical concerns.  If you have a medical emergency, go to the nearest emergency room or call 911.  A surgeon from [facility] is always on call at the hospital. °[address], [address], [place],   [address] ? [address] [address], [place],    [address] °[phone] ? [phone] ? FAX [phone] °Web site: [email or web address] ° °

## 2019-06-12 DIAGNOSIS — C50411 Malignant neoplasm of upper-outer quadrant of right female breast: Secondary | ICD-10-CM | POA: Diagnosis not present

## 2019-06-12 MED ORDER — TRAMADOL HCL 50 MG PO TABS: 50.0000 mg | ORAL_TABLET | Freq: Four times a day (QID) | ORAL | 0 refills | Status: DC | PRN

## 2019-06-12 NOTE — Progress Notes (Signed)
Caitlin Alexander to be D/C'd  per MD order. Discussed with the patient and all questions fully answered.  VSS, Skin clean, dry and intact without evidence of skin break down, no evidence of skin tears noted.  IV catheter discontinued intact. Site without signs and symptoms of complications. Dressing and pressure applied.  An After Visit Summary was printed and given to the patient. Patient received prescription.  D/c education completed with patient/family including follow up instructions, medication list, d/c activities limitations if indicated, with other d/c instructions as indicated by MD - patient able to verbalize understanding, all questions fully answered.   Taught patient drain care.   Patient instructed to return to ED, call 911, or call MD for any changes in condition.   Patient to be escorted via [place], and D/C home via private auto.

## 2019-06-12 NOTE — TOC Transition Note (Signed)
Transition of Care [facility]) - CM/SW Discharge Note   Patient Details  Name: Caitlin Alexander MRN: [id number] Date of Birth: 1936-01-08  Transition of Care [facility]) CM/SW Contact:  Maryclare Labrador, RN Phone Number: 06/12/2019, 12:34 PM   Clinical Narrative:   CM spoke with pt via phone.  Per attending pt is ready for discharge home today.  Pt is independent from home alone, per bedside nurse and therapy pt has remained independent during hospitalization.  Per therapy ; surgery will arrange outpt PT for CA pt's once pt is appropriate during an outpt follow up visit.  Pt interested in [facility] for education and initial care of newly placed drain as pt is s/p mastectomy.   Pt also communicated that after  her stomach surgery in 2014 she had a [facility] nurse that came in everyday and would like a HHRN each day.   CM explained that the [facility] arranged by the hospital could would only come into the home once or twice a week initally.  CM attempted to explore how "everyday" services were paid for or how they were set up in 2014,  however pt became defensive and told CM  "I'm absolutely not going to tell you who set it up - I will call them myself".   CM again reiterated a max of 2 visits for [facility] initially and asked pt who would perform care on other days.  Pt informed CM that either she will independently perform drain care or get a neighbor to perform.   Bedside nurse confirms that pt has been receiving education on drain care while hospitalized. Pt informed CM  that she has an active relationship with her PCP and denied barriers with paying for medications.  CM offered pt medicare.gov HH list choice - pt chose [facility] - agency accepts referral.    HH order written - face to face requested from attending    Final next level of care: [place]     Patient Goals and CMS Choice Patient states their goals for this hospitalization and ongoing recovery are:: Pt declined to state any goal CMS Medicare.gov  Compare Post Acute Care list provided to:: Patient Choice offered to / list presented to : Patient  Discharge Placement                       Discharge Plan and Services                          HH Arranged: RN [facility] Agency: [place] ([facility]) Date [place]: 06/12/19 Time [place]: 1233 Representative spoke with at [place]: [facility] ([place]) Interventions     Readmission Risk Interventions No flowsheet data found.

## 2019-06-12 NOTE — Progress Notes (Signed)
1 Day Post-Op   Subjective/Chief Complaint: Doing fine   Objective: Vital signs in last 24 hours: Temp:  [97.6 F (36.4 C)-98.3 F (36.8 C)] 98 F (36.7 C) (03/17 0532) Pulse Rate:  [52-75] 67 (03/17 0532) Resp:  [12-18] 18 (03/17 0532) BP: (108-155)/(62-110) 108/62 (03/17 0532) SpO2:  [95 %-100 %] 96 % (03/17 0532) Last BM Date: 06/10/19  Intake/Output from previous day: 03/16 0701 - 03/17 0700 In: 710.9 [P.O.:360; I.V.:350.9] Out: 438 [Urine:300; Drains:113; Blood:25] Intake/Output this shift: No intake/output data recorded.  right mastectomy incision clean without infection, drain serosang  Lab Results:  Recent Labs    06/11/19 0759  WBC 9.2  HGB 13.8  HCT 41.9  PLT 340   BMET No results for input(s): NA, K, CL, CO2, GLUCOSE, BUN, CREATININE, CALCIUM in the last 72 hours. PT/INR No results for input(s): LABPROT, INR in the last 72 hours. ABG No results for input(s): PHART, HCO3 in the last 72 hours.  Invalid input(s): PCO2, PO2  Studies/Results: No results found.  Anti-infectives: Anti-infectives (From admission, onward)   Start     Dose/Rate Route Frequency Ordered Stop   06/11/19 0800  ciprofloxacin (CIPRO) IVPB 400 mg     400 mg 200 mL/hr over 60 Minutes Intravenous On call to O.R. 06/11/19 0753 06/11/19 0940      Assessment/Plan: POD 1 right mastectomy -she really wants home care-await case mgt to see her otherwise will discharge home today with follow up  Rolm Bookbinder 06/12/2019

## 2019-06-12 NOTE — Evaluation (Signed)
Physical Therapy Evaluation and Discharge Patient Details Name: Caitlin Alexander MRN: [id number] DOB: [birth year] Today's Date: 06/12/2019   History of Present Illness  Pt is an 84 y/o female s/p R mastectomy secondary to clinical stage I right breast cancer. PMH including but not limited to hysterectomy and appendectomy.    Clinical Impression  Pt is a very chatty, pleasant woman that was standing up from EOB without difficulties upon arrival. Pt stated she has been ambulating to and from the bathroom all morning independently. Prior to admission, pt reported that she was independent with all functional mobility and ADLs. Pt lives alone in a single level home with a couple of steps to enter. She stated that her son and daughter-in-law are available intermittently to assist her. However, she repeatedly expressed her concerns with drain and incision site management, and insists on having home health services upon d/c. At the time of evaluation, pt overall moving incredibly well. She was at a mod I to independent level with all functional mobility. She ambulated the unit without use of an AD with no LOB or need for physical assistance. Would recommend f/u with OP PT at the [facility] once cleared by MD. No additional acute PT needs indicated at this time. PT signing off.     Follow Up Recommendations Outpatient PT;Other (comment)(OP PT Cancer Rehab)    Equipment Recommendations  None recommended by PT    Recommendations for Other Services       Precautions / Restrictions Precautions Precautions: Other (comment) Precaution Comments: R chest JP drain Restrictions Weight Bearing Restrictions: No      Mobility  Bed Mobility               General bed mobility comments: pt seated EOB upon arrival  Transfers Overall transfer level: Independent                  Ambulation/Gait Ambulation/Gait assistance: Modified independent (Device/Increase time) Gait Distance (Feet):  400 Feet Assistive device: None Gait Pattern/deviations: Step-through pattern;Decreased stride length Gait velocity: WFL   General Gait Details: pt overall steady with hallway ambulation; able to navigate around obstacles in hallway without difficulties; no LOB or need for physical assistance  Stairs            Wheelchair Mobility    Modified Rankin (Stroke Patients Only)       Balance Overall balance assessment: No apparent balance deficits (not formally assessed)                                           Pertinent Vitals/Pain Pain Assessment: No/denies pain    Home Living Family/patient expects to be discharged to:: Private residence Living Arrangements: Alone Available Help at Discharge: Family;Available PRN/intermittently Type of Home: House Home Access: Stairs to enter   [employer] of Steps: 2 Home Layout: One level Home Equipment: None      Prior Function Level of Independence: Independent         Comments: drives     Hand Dominance        Extremity/Trunk Assessment   Upper Extremity Assessment Upper Extremity Assessment: Overall WFL for tasks assessed    Lower Extremity Assessment Lower Extremity Assessment: Overall WFL for tasks assessed    Cervical / Trunk Assessment Cervical / Trunk Assessment: Kyphotic  Communication   Communication: No difficulties  Cognition Arousal/Alertness: Awake/alert Behavior  During Therapy: Restless Overall Cognitive Status: Impaired/Different from baseline Area of Impairment: Attention;Memory                   Current Attention Level: Sustained Memory: Decreased short-term memory         General Comments: pt very chatty throughout and perseverating on needing HH services      General Comments      Exercises     Assessment/Plan    PT Assessment Patent does not need any further PT services;All further PT needs can be met in the next venue of care  PT  Problem List Decreased knowledge of precautions;Decreased coordination;Decreased safety awareness;Decreased cognition;Decreased strength       PT Treatment Interventions      PT Goals (Current goals can be found in the Care Plan section)  Acute Rehab PT Goals Patient Stated Goal: perseverating on receiving HH services for drain and incision site management PT Goal Formulation: All assessment and education complete, DC therapy    Frequency     Barriers to discharge        Co-evaluation               AM-PAC PT "6 Clicks" Mobility  Outcome Measure Help needed turning from your back to your side while in a flat bed without using bedrails?: None Help needed moving from lying on your back to sitting on the side of a flat bed without using bedrails?: None Help needed moving to and from a bed to a chair (including a wheelchair)?: None Help needed standing up from a chair using your arms (e.g., wheelchair or bedside chair)?: None Help needed to walk in hospital room?: None Help needed climbing 3-5 steps with a railing? : A Little 6 Click Score: 23    End of Session   Activity Tolerance: Patient tolerated treatment well Patient left: with call bell/phone within reach;Other (comment)(sitting EOB) Nurse Communication: Mobility status PT Visit Diagnosis: Muscle weakness (generalized) (M62.81)    Time: 1017-1050 PT Time Calculation (min) (ACUTE ONLY): 33 min   Charges:   PT Evaluation $PT Eval Moderate Complexity: 1 Mod PT Treatments $Gait Training: 8-22 mins        Anastasio Champion, DPT  Acute Rehabilitation Services Pager [phone] Office [place] 06/12/2019, 10:56 AM

## 2019-06-13 NOTE — Discharge Summary (Signed)
Physician Discharge Summary  Patient ID: Caitlin Alexander MRN: [id number] DOB/AGE: 1936/03/05 84 y.o.  Admit date: 06/11/2019 Discharge date: 06/13/2019  Admission Diagnoses: Right breast cancer  Discharge Diagnoses:  Active Problems:   Breast cancer, right [facility])   Discharged Condition: good  Hospital Course: 43 yof s/p right mastectomy for recurrent microinvasive cancer.  She did well overnight and will be discharged home with home health  Consults: None  Significant Diagnostic Studies: none  Treatments: surgery: right mastectomy   Disposition: Discharge disposition: 01-Home or Self Care       Discharge Instructions    Ambulatory referral to Physical Therapy   Complete by: As directed    S/p right mastectomy 06/11/2019 Eval and treat     Allergies as of 06/12/2019      Reactions   Macrodantin [nitrofurantoin Macrocrystal] Other (See Comments)   Unknown   Nystatin    Pt can't remember what the allergy was    Promethazine Other (See Comments)   Hallucinations   Sulfa Antibiotics Other (See Comments)   Whelps   Suprax [cefixime] Other (See Comments)   Unknown   Penicillins Rash   Did it involve swelling of the face/tongue/throat, SOB, or low BP? Unknown Did it involve sudden or severe rash/hives, skin peeling, or any reaction on the inside of your mouth or nose? No Did you need to seek medical attention at a hospital or doctor's office? Unknown When did it last happen?unknown If all above answers are "NO", may proceed with cephalosporin use.      Medication List    TAKE these medications   acetaminophen 500 MG tablet Commonly known as: TYLENOL Take 500 mg by mouth every 6 (six) hours as needed for moderate pain or headache.   LORazepam 1 MG tablet Commonly known as: ATIVAN Take 1 mg by mouth 3 (three) times daily.   polyethylene glycol 17 g packet Commonly known as: MIRALAX / GLYCOLAX Take 17 g by mouth every morning.   THERATEARS OP Place 1  drop into both eyes daily as needed (dry eyes).   traMADol 50 MG tablet Commonly known as: ULTRAM Take 1 tablet (50 mg total) by mouth every 6 (six) hours as needed.      Follow-up Information    Rolm Bookbinder, MD In 1 week.   Specialty: General Surgery Contact information: [address] [place] [address] [phone]           Signed: Rolm Bookbinder 06/13/2019, 10:37 AM

## 2019-06-14 LAB — SURGICAL PATHOLOGY

## 2019-06-14 NOTE — Anesthesia Postprocedure Evaluation (Signed)
Anesthesia Post Note  Patient: Caitlin Alexander  Procedure(s) Performed: RIGHT MASTECTOMY (Right Breast)     Patient location during evaluation: PACU Anesthesia Type: Regional and General Level of consciousness: awake and alert Pain management: pain level controlled Vital Signs Assessment: post-procedure vital signs reviewed and stable Respiratory status: spontaneous breathing, nonlabored ventilation, respiratory function stable and patient connected to nasal cannula oxygen Cardiovascular status: blood pressure returned to baseline and stable Postop Assessment: no apparent nausea or vomiting Anesthetic complications: no    Last Vitals:  Vitals:   06/12/19 0532 06/12/19 1213  BP: 108/62 110/65  Pulse: 67 71  Resp: 18 18  Temp: 36.7 C 36.6 C  SpO2: 96% 96%    Last Pain:  Vitals:   06/12/19 1431  TempSrc:   PainSc: 9                  Dynver Clemson

## 2019-06-20 NOTE — Addendum Note (Signed)
Addendum  created 06/20/19 1911 by Oleta Mouse, MD   Clinical Note Signed, Intraprocedure Blocks edited

## 2019-08-07 ENCOUNTER — Other Ambulatory Visit (HOSPITAL_COMMUNITY): Payer: Medicare Other

## 2019-08-14 ENCOUNTER — Inpatient Hospital Stay (HOSPITAL_COMMUNITY): Payer: Medicare Other | Attending: Hematology | Admitting: Hematology

## 2019-08-14 ENCOUNTER — Other Ambulatory Visit: Payer: Self-pay

## 2019-08-14 DIAGNOSIS — C50411 Malignant neoplasm of upper-outer quadrant of right female breast: Secondary | ICD-10-CM | POA: Insufficient documentation

## 2019-08-14 DIAGNOSIS — M810 Age-related osteoporosis without current pathological fracture: Secondary | ICD-10-CM | POA: Insufficient documentation

## 2019-08-14 DIAGNOSIS — Z9011 Acquired absence of right breast and nipple: Secondary | ICD-10-CM | POA: Insufficient documentation

## 2019-08-14 DIAGNOSIS — E559 Vitamin D deficiency, unspecified: Secondary | ICD-10-CM | POA: Insufficient documentation

## 2019-08-14 DIAGNOSIS — Z79899 Other long term (current) drug therapy: Secondary | ICD-10-CM | POA: Diagnosis not present

## 2019-08-14 DIAGNOSIS — Z170 Estrogen receptor positive status [ER+]: Secondary | ICD-10-CM | POA: Diagnosis not present

## 2019-08-14 DIAGNOSIS — R5383 Other fatigue: Secondary | ICD-10-CM | POA: Diagnosis not present

## 2019-08-14 MED ORDER — TAMOXIFEN CITRATE 20 MG PO TABS: 20.0000 mg | ORAL_TABLET | Freq: Every day | ORAL | 6 refills | Status: DC

## 2019-08-14 NOTE — Patient Instructions (Addendum)
[place] at [facility] Discharge Instructions  You were seen today by Dr. Delton Coombes.  He talked with you about how you have been feeling lately. He will start you on Tamoxifen to reduce future breast cancer risk. He will see you back in 6 weeks with labs.     Thank you for choosing [place] at [facility] to provide your oncology and hematology care.  To afford each patient quality time with our provider, please arrive at least 15 minutes before your scheduled appointment time.   If you have a lab appointment with the [place] please come in thru the  Main Entrance and check in at the main information desk  You need to re-schedule your appointment should you arrive 10 or more minutes late.  We strive to give you quality time with our providers, and arriving late affects you and other patients whose appointments are after yours.  Also, if you no show three or more times for appointments you may be dismissed from the clinic at the providers discretion.     Again, thank you for choosing [facility].  Our hope is that these requests will decrease the amount of time that you wait before being seen by our physicians.       _____________________________________________________________  Should you have questions after your visit to [facility], please contact our office at [phone] between the hours of 8:30 a.m. and 4:30 p.m.  Voicemails left after 4:30 p.m. will not be returned until the following business day.  For prescription refill requests, have your pharmacy contact our office.     Thank you for choosing [place] at [facility] to provide your oncology and hematology care.  To afford each patient quality time with our provider, please arrive at least 15 minutes before your scheduled appointment time.   If you have a lab appointment with the [facility] please come in thru the Main  Entrance and check in at the main information desk.  You need to re-schedule your appointment should you arrive 10 or more minutes late.  We strive to give you quality time with our providers, and arriving late affects you and other patients whose appointments are after yours.  Also, if you no show three or more times for appointments you may be dismissed from the clinic at the providers discretion.     Again, thank you for choosing [facility].  Our hope is that these requests will decrease the amount of time that you wait before being seen by our physicians.       _____________________________________________________________  Should you have questions after your visit to [facility], please contact our office at [phone] between the hours of 8:00 a.m. and 4:30 p.m.  Voicemails left after 4:00 p.m. will not be returned until the following business day.  For prescription refill requests, have your pharmacy contact our office and allow 72 hours.    Due to Covid, you will need to wear a mask upon entering the hospital. If you do not have a mask, a mask will be given to you at the Main Entrance upon arrival. For doctor visits, patients may have 1 support person with them. For treatment visits, patients can not have anyone with them due to social distancing guidelines and our immunocompromised population.

## 2019-08-14 NOTE — Assessment & Plan Note (Signed)
1.  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  Declined tamoxifen. -Right mastectomy on 06/11/2019 for recurrence.  0.6 cm IDC, grade 1, 1/2 lymph nodes positive with extracapsular extension.  ER is 100%, PR is 0%.  HER-2 is negative.  Ki-67 2%.  PT1BPN1A. -We discussed pathology report in detail.  We also discussed normal management of breast cancer.  She is not interested in pursuing radiation therapy. -We talked about tamoxifen and its side effects in detail.  She would like to give a trial of tamoxifen.  We have sent prescription. -I plan to see her back in 6 weeks for follow-up with repeat labs and see how she is tolerating tamoxifen.  2.  Osteoporosis: -Bone density on 09/21/2017 shows T score -3.2 in the right femoral neck.  Prolia was recommended in the past which was declined.  3.  Vitamin D deficiency: -She will continue vitamin D 2000 units daily.  Last vitamin D was 20.57. 

## 2019-08-14 NOTE — Progress Notes (Signed)
Patient Care Team: Caitlin Noble, MD as PCP - General (Internal Medicine)  SUMMARY OF ONCOLOGIC HISTORY: Oncology History  Breast cancer of upper-outer quadrant of right female breast ([place])  04/02/2018 Initial Diagnosis   Breast cancer of upper-outer quadrant of right female breast ([place])   08/14/2019 Cancer Staging   Staging form: Breast, AJCC 8th Edition - Clinical stage from 08/14/2019: Stage IIA (rcT1b, cN1, cM0, G1, ER+, PR-, HER2-) - Signed by Caitlin Jack, MD on 08/14/2019     CHIEF COMPLIANT: Recurrent right breast cancer.   INTERVAL HISTORY: [name] is a 84 y.o. female here today for follow up of her ductal carcinoma in situ. Her last visit was on 05/29/2019.  She underwent right mastectomy by Dr. Donne Alexander on 06/11/2019.  She is feeling better.  Wound is healed well.  Appetite is 100%.  Energy levels are 50%.  Denies any new onset pains.  REVIEW OF SYSTEMS:   Review of Systems  Constitutional: Positive for fatigue (moderate). Negative for appetite change, chills, diaphoresis and fever.  HENT:   Negative for mouth sores, sore throat and trouble swallowing.   Eyes: Negative for eye problems.  Respiratory: Negative for cough, shortness of breath and wheezing.   Cardiovascular: Negative for chest pain, leg swelling and palpitations.  Gastrointestinal: Negative for abdominal pain, constipation, diarrhea, nausea and vomiting.  Genitourinary: Negative for bladder incontinence, dysuria and frequency.   Musculoskeletal: Negative for arthralgias, back pain and myalgias.  Skin: Negative for rash.  Neurological: Negative for dizziness, extremity weakness, headaches and numbness.  Hematological: Does not bruise/bleed easily.  Psychiatric/Behavioral: Negative for depression and sleep disturbance. The patient is not nervous/anxious.     I have reviewed the past medical history, past surgical history, social history and family history with the patient and they are unchanged  from previous note.   ALLERGIES:   is allergic to macrodantin [nitrofurantoin macrocrystal]; nystatin; promethazine; sulfa antibiotics; suprax [cefixime]; and penicillins.   MEDICATIONS:  Current Outpatient Medications  Medication Sig Dispense Refill  . LORazepam (ATIVAN) 1 MG tablet Take 1 mg by mouth 3 (three) times daily.     . polyethylene glycol (MIRALAX / GLYCOLAX) packet Take 17 g by mouth every morning.    Marland Kitchen acetaminophen (TYLENOL) 500 MG tablet Take 500 mg by mouth every 6 (six) hours as needed for moderate pain or headache.     . Carboxymethylcellulose Sodium (THERATEARS OP) Place 1 drop into both eyes daily as needed (dry eyes).    . tamoxifen (NOLVADEX) 20 MG tablet Take 1 tablet (20 mg total) by mouth daily. 30 tablet 6   No current facility-administered medications for this visit.     PHYSICAL EXAMINATION: Performance status (ECOG): 1 - Symptomatic but completely ambulatory  Vitals:   08/14/19 1427  BP: 137/64  Pulse: 65  Resp: 18  Temp: 98 F (36.7 C)  SpO2: 97%   Wt Readings from Last 3 Encounters:  08/14/19 134 lb (60.8 kg)  06/11/19 133 lb (60.3 kg)  04/16/19 134 lb 8 oz (61 kg)   Physical Exam Constitutional:      General: She is not in acute distress.    Appearance: Normal appearance. She is normal weight. She is not ill-appearing.  HENT:     Mouth/Throat:     Mouth: Mucous membranes are moist.     Pharynx: No oropharyngeal exudate or posterior oropharyngeal erythema.  Eyes:     Extraocular Movements: Extraocular movements intact.     Pupils: Pupils are equal,  round, and reactive to light.  Cardiovascular:     Rate and Rhythm: Normal rate and regular rhythm.     Pulses: Normal pulses.     Heart sounds: Normal heart sounds. No murmur. No friction rub. No gallop.   Pulmonary:     Effort: Pulmonary effort is normal.     Breath sounds: Normal breath sounds. No wheezing, rhonchi or rales.  Chest:     Breasts:        Right: Tenderness (70mos/p  mastectomy) present.   Abdominal:     Palpations: There is no mass.     Tenderness: There is no abdominal tenderness. There is no guarding.  Musculoskeletal:        General: No swelling or tenderness.     Right lower leg: No edema.     Left lower leg: No edema.  Skin:    Findings: No bruising or erythema.  Neurological:     Mental Status: She is alert and oriented to person, place, and time.     Sensory: No sensory deficit.  Psychiatric:        Mood and Affect: Mood normal.        Behavior: Behavior normal.        Thought Content: Thought content normal.        Judgment: Judgment normal.   Right mastectomy site does not have any suspicious nodules.  Left breast has no palpable masses.  No palpable adenopathy.  Breast Exam Chaperone: ABiomedical scientist    LABORATORY DATA:  I have reviewed the data as listed CMP Latest Ref Rng & Units 04/09/2019 12/04/2018 03/26/2018  Glucose 70 - 99 mg/dL 112(H) 100(H) 74  BUN 8 - 23 mg/dL _0 Creatinine 0.44 - 1.00 mg/dL 0.76 0.73 0.69  Sodium 135 - 145 mmol/L 136 141 137  Potassium 3.5 - 5.1 mmol/L 4.3 4.6 4.3  Chloride 98 - 111 mmol/L 101 106 104  CO2 22 - 32 mmol/L _1 Calcium 8.9 - 10.3 mg/dL 9.0 9.3 9.2  Total Protein 6.5 - 8.1 g/dL 7.2 7.1 6.9  Total Bilirubin 0.3 - 1.2 mg/dL 0.5 1.0 0.4  Alkaline Phos 38 - 126 U/L 71 64 66  AST 15 - 41 U/L _2 ALT 0 - 44 U/L _3 No results found for: [id number] Lab Results  Component Value Date   WBC 9.2 06/11/2019   HGB 13.8 06/11/2019   HCT 41.9 06/11/2019   MCV 94.2 06/11/2019   PLT 340 06/11/2019   NEUTROABS 3.4 04/09/2019    ASSESSMENT & PLAN:  Breast cancer of upper-outer quadrant of right female breast ([place].  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  Declined tamoxifen. -Right mastectomy on 06/11/2019 for recurrence.   0.6 cm IDC, grade 1, 1/2 lymph nodes positive with extracapsular extension.  ER is 100%, PR is 0%.  HER-2 is negative.  Ki-67 2%.  PT1BPN1A. -We discussed pathology report in detail.  We also discussed normal management of breast cancer.  She is not interested in pursuing radiation therapy. -We talked about tamoxifen and its side effects in detail.  She would like to give a trial of tamoxifen.  We have sent prescription. -I plan to see her back in 6 weeks for follow-up with repeat labs and see how she is  tolerating tamoxifen.  2.  Osteoporosis: -Bone density on 09/21/2017 shows T score -3.2 in the right femoral neck.  Prolia was recommended in the past which was declined.  3.  Vitamin D deficiency: -She will continue vitamin D 2000 units daily.  Last vitamin D was 20.57.    Breast Cancer therapy associated bone loss: I have recommended calcium, Vitamin D and weight bearing exercises.  Total time spent is 30 minutes with more than 50% of the time spent face-to-face discussing diagnosis, treatment plan, counseling and coordination of care.  No orders of the defined types were placed in this encounter.  The patient has a good understanding of the overall plan. she agrees with it. she will call with any problems that may develop before the next visit here.    Caitlin Jack, MD, 08/14/19 5:28 PM  [place] [phone]   I, Jacqualyn Posey, am acting as a scribe for Dr. Sanda Linger.  I, Caitlin Jack MD, have reviewed the above documentation for accuracy and completeness, and I agree with the above.

## 2019-09-25 ENCOUNTER — Inpatient Hospital Stay (HOSPITAL_COMMUNITY): Payer: Medicare Other

## 2019-09-25 ENCOUNTER — Inpatient Hospital Stay (HOSPITAL_COMMUNITY): Payer: Medicare Other | Attending: Hematology

## 2019-09-25 ENCOUNTER — Other Ambulatory Visit: Payer: Self-pay

## 2019-09-25 DIAGNOSIS — Z170 Estrogen receptor positive status [ER+]: Secondary | ICD-10-CM | POA: Diagnosis not present

## 2019-09-25 DIAGNOSIS — D0511 Intraductal carcinoma in situ of right breast: Secondary | ICD-10-CM | POA: Diagnosis not present

## 2019-09-25 DIAGNOSIS — E559 Vitamin D deficiency, unspecified: Secondary | ICD-10-CM | POA: Insufficient documentation

## 2019-09-25 DIAGNOSIS — R978 Other abnormal tumor markers: Secondary | ICD-10-CM | POA: Diagnosis not present

## 2019-09-25 DIAGNOSIS — C50411 Malignant neoplasm of upper-outer quadrant of right female breast: Secondary | ICD-10-CM | POA: Insufficient documentation

## 2019-09-25 LAB — COMPREHENSIVE METABOLIC PANEL
ALT: 20 U/L (ref 0–44)
AST: 24 U/L (ref 15–41)
Albumin: 4 g/dL (ref 3.5–5.0)
Alkaline Phosphatase: 63 U/L (ref 38–126)
Anion gap: 9 (ref 5–15)
BUN: 9 mg/dL (ref 8–23)
CO2: 28 mmol/L (ref 22–32)
Calcium: 9.2 mg/dL (ref 8.9–10.3)
Chloride: 101 mmol/L (ref 98–111)
Creatinine, Ser: 0.81 mg/dL (ref 0.44–1.00)
GFR calc Af Amer: >60 mL/min (ref >60)
GFR calc non Af Amer: >60 mL/min (ref >60)
Glucose, Bld: 163 mg/dL — ABNORMAL HIGH (ref 70–99)
Potassium: 4.5 mmol/L (ref 3.5–5.1)
Sodium: 138 mmol/L (ref 135–145)
Total Bilirubin: 0.8 mg/dL (ref 0.3–1.2)
Total Protein: 6.9 g/dL (ref 6.5–8.1)

## 2019-09-25 LAB — CBC WITH DIFFERENTIAL/PLATELET
Abs Immature Granulocytes: 0.02 10*3/uL (ref 0.00–0.07)
Basophils Absolute: 0.1 10*3/uL (ref 0.0–0.1)
Basophils Relative: 1 %
Eosinophils Absolute: 0.2 10*3/uL (ref 0.0–0.5)
Eosinophils Relative: 3 %
HCT: 42.7 % (ref 36.0–46.0)
Hemoglobin: 14 g/dL (ref 12.0–15.0)
Immature Granulocytes: 0 %
Lymphocytes Relative: 47 %
Lymphs Abs: 3.8 10*3/uL (ref 0.7–4.0)
MCH: 31.7 pg (ref 26.0–34.0)
MCHC: 32.8 g/dL (ref 30.0–36.0)
MCV: 96.6 fL (ref 80.0–100.0)
Monocytes Absolute: 1.1 10*3/uL — ABNORMAL HIGH (ref 0.1–1.0)
Monocytes Relative: 13 %
Neutro Abs: 2.9 10*3/uL (ref 1.7–7.7)
Neutrophils Relative %: 36 %
Platelets: 303 10*3/uL (ref 150–400)
RBC: 4.42 MIL/uL (ref 3.87–5.11)
RDW: 13.3 % (ref 11.5–15.5)
WBC: 8.1 10*3/uL (ref 4.0–10.5)
nRBC: 0 % (ref 0.0–0.2)

## 2019-09-25 LAB — VITAMIN D 25 HYDROXY (VIT D DEFICIENCY, FRACTURES): Vit D, 25-Hydroxy: 27.15 ng/mL — ABNORMAL LOW (ref 30–100)

## 2019-09-26 LAB — CANCER ANTIGEN 15-3: CA 15-3: 37.3 U/mL — ABNORMAL HIGH (ref 0.0–25.0)

## 2019-10-02 ENCOUNTER — Ambulatory Visit (HOSPITAL_COMMUNITY): Payer: Medicare Other | Admitting: Hematology

## 2019-10-08 ENCOUNTER — Other Ambulatory Visit: Payer: Self-pay

## 2019-10-08 ENCOUNTER — Inpatient Hospital Stay (HOSPITAL_COMMUNITY): Payer: Medicare Other | Attending: Hematology | Admitting: Nurse Practitioner

## 2019-10-08 DIAGNOSIS — C50411 Malignant neoplasm of upper-outer quadrant of right female breast: Secondary | ICD-10-CM | POA: Diagnosis not present

## 2019-10-08 DIAGNOSIS — Z79899 Other long term (current) drug therapy: Secondary | ICD-10-CM | POA: Diagnosis not present

## 2019-10-08 DIAGNOSIS — I319 Disease of pericardium, unspecified: Secondary | ICD-10-CM | POA: Insufficient documentation

## 2019-10-08 DIAGNOSIS — E785 Hyperlipidemia, unspecified: Secondary | ICD-10-CM | POA: Diagnosis not present

## 2019-10-08 DIAGNOSIS — E559 Vitamin D deficiency, unspecified: Secondary | ICD-10-CM | POA: Insufficient documentation

## 2019-10-08 DIAGNOSIS — M810 Age-related osteoporosis without current pathological fracture: Secondary | ICD-10-CM | POA: Insufficient documentation

## 2019-10-08 DIAGNOSIS — D0511 Intraductal carcinoma in situ of right breast: Secondary | ICD-10-CM | POA: Diagnosis not present

## 2019-10-08 DIAGNOSIS — M129 Arthropathy, unspecified: Secondary | ICD-10-CM | POA: Diagnosis not present

## 2019-10-08 DIAGNOSIS — Z170 Estrogen receptor positive status [ER+]: Secondary | ICD-10-CM | POA: Diagnosis not present

## 2019-10-08 DIAGNOSIS — F419 Anxiety disorder, unspecified: Secondary | ICD-10-CM | POA: Insufficient documentation

## 2019-10-08 NOTE — Progress Notes (Signed)
[facility] [place], [place] [address]   CLINIC:  Medical Oncology/Hematology  PCP:  Caitlin Alexander, [place]  [phone]   REASON FOR VISIT: Follow-up for breast cancer   CURRENT THERAPY: Declined therapy  BRIEF ONCOLOGIC HISTORY:  Oncology History  Breast cancer of upper-outer quadrant of right female breast ([place])  04/02/2018 Initial Diagnosis   Breast cancer of upper-outer quadrant of right female breast ([place])   08/14/2019 Cancer Staging   Staging form: Breast, AJCC 8th Edition - Clinical stage from 08/14/2019: Stage IIA (rcT1b, cN1, cM0, G1, ER+, PR-, HER2-) - Signed by Caitlin Jack, MD on 08/14/2019     CANCER STAGING: Cancer Staging Breast cancer of upper-outer quadrant of right female breast [facility]) Staging form: Breast, AJCC 8th Edition - Clinical stage from 08/14/2019: Stage IIA (rcT1b, cN1, cM0, G1, ER+, PR-, HER2-) - Signed by Caitlin Jack, MD on 08/14/2019    INTERVAL HISTORY:  [name] 84 y.o. female returns for routine follow-up for breast cancer. Patient reports she is doing well since her last visit. She denies any new lumps or bumps present. Her recent mastectomy scar is very well-healed. Denies any nausea, vomiting, or diarrhea. Denies any new pains. Had not noticed any recent bleeding such as epistaxis, hematuria or hematochezia. Denies recent chest pain on exertion, shortness of breath on minimal exertion, pre-syncopal episodes, or palpitations. Denies any numbness or tingling in hands or feet. Denies any recent fevers, infections, or recent hospitalizations. Patient reports appetite at 100% and energy level at 100%.     REVIEW OF SYSTEMS:  Review of Systems  All other systems reviewed and are negative.    PAST MEDICAL/SURGICAL HISTORY:  Past Medical History:  Diagnosis Date  . Anxiety   . Arthritis   . Breast cancer ([place]) 2018  . Cancer [facility])    breast cancer  .  Concussion    hit by a softball on left side of head  . Hyperlipidemia   . Osteoporosis   . Pericarditis 1961  . PONV (postoperative nausea and vomiting)   . Thyroiditis 1971   Past Surgical History:  Procedure Laterality Date  . ABDOMINAL HYSTERECTOMY  1985  . ABDOMINAL SURGERY    . APPENDECTOMY    . BOWEL RESECTION  10/31/2011   Procedure: SMALL BOWEL RESECTION;  Surgeon: Donato Heinz, MD;  Location: [facility];  Service: General;;  . BREAST LUMPECTOMY Right   . BREAST LUMPECTOMY WITH RADIOACTIVE SEED LOCALIZATION Right 02/07/2017   Procedure: BREAST LUMPECTOMY WITH RADIOACTIVE SEED LOCALIZATION;  Surgeon: Rolm Bookbinder, MD;  Location: [facility];  Service: General;  Laterality: Right;  . BREAST LUMPECTOMY WITH RADIOACTIVE SEED LOCALIZATION Right 02/27/2018   Procedure: RIGHT BREAST LUMPECTOMY WITH RADIOACTIVE SEED LOCALIZATION;  Surgeon: Rolm Bookbinder, MD;  Location: [place];  Service: General;  Laterality: Right;  . FISSURECTOMY    . 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 LAPAROTOMY  10/31/2011   Procedure: EXPLORATORY LAPAROTOMY;  Surgeon: Donato Heinz, MD;  Location: [facility];  Service: General;  Laterality: N/A;  . NASAL SEPTUM SURGERY  1990  . NASAL SINUS SURGERY    . SIMPLE MASTECTOMY WITH AXILLARY SENTINEL NODE BIOPSY Right 06/11/2019   Procedure: RIGHT MASTECTOMY;  Surgeon: Rolm Bookbinder, MD;  Location: [facility];  Service: General;  Laterality: Right;  . TUBAL LIGATION       SOCIAL HISTORY:  Social History   Socioeconomic History  . Marital  status: Widowed    Spouse name: Not on file  . Number of children: Not on file  . Years of education: Not on file  . Highest education level: Not on file  Occupational History  . Not on file  Tobacco Use  . Smoking status: Never Smoker  . Smokeless tobacco: Never Used  Vaping Use  . Vaping Use: Never used  Substance and Sexual Activity  . Alcohol use: No  . Drug use: No  . Sexual activity: Never    Other Topics Concern  . Not on file  Social History Narrative  . Not on file   Social Determinants of Health   Financial Resource Strain:   . Difficulty of Paying Living Expenses:   Food Insecurity:   . Worried About Charity fundraiser in the Last Year:   . Arboriculturist in the Last Year:   Transportation Needs:   . Film/video editor (Medical):   Marland Kitchen Lack of Transportation (Non-Medical):   Physical Activity:   . Days of Exercise per Week:   . Minutes of Exercise per Session:   Stress:   . Feeling of Stress :   Social Connections:   . Frequency of Communication with Friends and Family:   . Frequency of Social Gatherings with Friends and Family:   . Attends Religious Services:   . Active Member of Clubs or Organizations:   . Attends Archivist Meetings:   Marland Kitchen Marital Status:   Intimate Partner Violence:   . Fear of Current or Ex-Partner:   . Emotionally Abused:   Marland Kitchen Physically Abused:   . Sexually Abused:     FAMILY HISTORY:  Family History  Problem Relation Age of Onset  . Diabetes Brother   . Hypertension Son   . Ulcers Sister     CURRENT MEDICATIONS:  Outpatient Encounter Medications as of 10/08/2019  Medication Sig  . Carboxymethylcellulose Sodium (THERATEARS OP) Place 1 drop into both eyes daily as needed (dry eyes).  . LORazepam (ATIVAN) 1 MG tablet Take 1 mg by mouth 3 (three) times daily.   . polyethylene glycol (MIRALAX / GLYCOLAX) packet Take 17 g by mouth every morning.  . tamoxifen (NOLVADEX) 20 MG tablet Take 1 tablet (20 mg total) by mouth daily.  Marland Kitchen acetaminophen (TYLENOL) 500 MG tablet Take 500 mg by mouth every 6 (six) hours as needed for moderate pain or headache.  (Patient not taking: Reported on 10/08/2019)   No facility-administered encounter medications on file as of 10/08/2019.    ALLERGIES:  Allergies  Allergen Reactions  . Macrodantin [Nitrofurantoin Macrocrystal] Other (See Comments)    Unknown  . Nystatin     Pt can't  remember what the allergy was   . Promethazine Other (See Comments)    Hallucinations  . Sulfa Antibiotics Other (See Comments)    Whelps  . Suprax [Cefixime] Other (See Comments)    Unknown  . Penicillins Rash    Did it involve swelling of the face/tongue/throat, SOB, or low BP? Unknown Did it involve sudden or severe rash/hives, skin peeling, or any reaction on the inside of your mouth or nose? No Did you need to seek medical attention at a hospital or doctor's office? Unknown When did it last happen?unknown If all above answers are "NO", may proceed with cephalosporin use.      PHYSICAL EXAM:  ECOG Performance status: 1  Vitals:   10/08/19 1443  BP: 137/69  Pulse: 64  Resp: 18  Temp: (!) 97.5 F (36.4 C)  SpO2: 96%   Filed Weights   10/08/19 1443  Weight: 128 lb (58.1 kg)   Physical Exam Constitutional:      Appearance: Normal appearance. She is normal weight.  Cardiovascular:     Rate and Rhythm: Normal rate.     Heart sounds: Normal heart sounds.  Pulmonary:     Effort: Pulmonary effort is normal.     Breath sounds: Normal breath sounds.  Abdominal:     General: Bowel sounds are normal.     Palpations: Abdomen is soft.  Musculoskeletal:        General: Normal range of motion.  Skin:    General: Skin is warm.  Neurological:     Mental Status: She is alert and oriented to person, place, and time. Mental status is at baseline.  Psychiatric:        Mood and Affect: Mood normal.        Behavior: Behavior normal.        Thought Content: Thought content normal.        Judgment: Judgment normal.      LABORATORY DATA:  I have reviewed the labs as listed.  CBC    Component Value Date/Time   WBC 8.1 09/25/2019 0853   RBC 4.42 09/25/2019 0853   HGB 14.0 09/25/2019 0853   HCT 42.7 09/25/2019 0853   PLT 303 09/25/2019 0853   MCV 96.6 09/25/2019 0853   MCH 31.7 09/25/2019 0853   MCHC 32.8 09/25/2019 0853   RDW 13.3 09/25/2019 0853   LYMPHSABS 3.8  09/25/2019 0853   MONOABS 1.1 (H) 09/25/2019 0853   EOSABS 0.2 09/25/2019 0853   BASOSABS 0.1 09/25/2019 0853   CMP Latest Ref Rng & Units 09/25/2019 04/09/2019 12/04/2018  Glucose 70 - 99 mg/dL 163(H) 112(H) 100(H)  BUN 8 - 23 mg/dL _0 Creatinine 0.44 - 1.00 mg/dL 0.81 0.76 0.73  Sodium 135 - 145 mmol/L 138 136 141  Potassium 3.5 - 5.1 mmol/L 4.5 4.3 4.6  Chloride 98 - 111 mmol/L 101 101 106  CO2 22 - 32 mmol/L _1 Calcium 8.9 - 10.3 mg/dL 9.2 9.0 9.3  Total Protein 6.5 - 8.1 g/dL 6.9 7.2 7.1  Total Bilirubin 0.3 - 1.2 mg/dL 0.8 0.5 1.0  Alkaline Phos 38 - 126 U/L 63 71 64  AST 15 - 41 U/L _2 ALT 0 - 44 U/L _3 All questions were answered to patient's stated satisfaction. Encouraged patient to call with any new concerns or questions before his next visit to the cancer center and we can certain see him sooner, if needed.     ASSESSMENT & PLAN:  Ductal carcinoma in situ (DCIS) of right breast 1.  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  Declined tamoxifen. -Right mastectomy on 06/11/2019 for recurrence.  0.6 cm IDC, grade 1, 1/2 lymph nodes positive with extracapsular extension.  ER was 100%.  PR 0%.  HER-2 was negative.  Ki-67 2%.  PT1BPN1A. -She was not interested in pursuing radiation therapy. -She started tamoxifen on 08/14/2019. -She stopped tamoxifen in the middle of June due to when she sat still she could feel her heartbeat. She had multiple EKGs at her primary care doctor's office and they were all normal. She does not want to go back  on any antiestrogen pills. -Labs done on 09/25/2019 were all WNL -We will see her back in 4 months with repeat labs.  2.  Osteoporosis: -Bone density on 09/21/2017 showed T score of -3.2 in the right femoral neck. -Prolia was recommended in the past which was declined. -We have  recommended calcium and vitamin D daily  3.  Vitamin D deficiency: -Last vitamin D level was 20.57. -She will continue vitamin D 2000 units daily.     Orders placed this encounter:  Orders Placed This Encounter  Procedures  . Lactate dehydrogenase  . CBC with Differential/Platelet  . Comprehensive metabolic panel  . Vitamin B12  . VITAMIN D 25 Hydroxy (Vit-D Deficiency, Fractures)      Caitlin Finders, FNP-C [place] [phone]

## 2019-10-08 NOTE — Assessment & Plan Note (Addendum)
1.  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  Declined tamoxifen. -Right mastectomy on 06/11/2019 for recurrence.  0.6 cm IDC, grade 1, 1/2 lymph nodes positive with extracapsular extension.  ER was 100%.  PR 0%.  HER-2 was negative.  Ki-67 2%.  PT1BPN1A. -She was not interested in pursuing radiation therapy. -She started tamoxifen on 08/14/2019. -She stopped tamoxifen in the middle of June due to when she sat still she could feel her heartbeat. She had multiple EKGs at her primary care doctor's office and they were all normal. She does not want to go back on any antiestrogen pills. -Labs done on 09/25/2019 were all WNL -We will see her back in 4 months with repeat labs.  2.  Osteoporosis: -Bone density on 09/21/2017 showed T score of -3.2 in the right femoral neck. -Prolia was recommended in the past which was declined. -We have recommended calcium and vitamin D daily  3.  Vitamin D deficiency: -Last vitamin D level was 20.57. -She will continue vitamin D 2000 units daily.

## 2019-10-08 NOTE — Patient Instructions (Signed)
[facility] Discharge Instructions  Follow up in 4 months with labs    Thank you for choosing [facility] to provide your oncology and hematology care.  To afford each patient quality time with our provider, please arrive at least 15 minutes before your scheduled appointment time.   If you have a lab appointment with the Cancer Center please come in thru the Main Entrance and check in at the main information desk.  You need to re-schedule your appointment should you arrive 10 or more minutes late.  We strive to give you quality time with our providers, and arriving late affects you and other patients whose appointments are after yours.  Also, if you no show three or more times for appointments you may be dismissed from the clinic at the providers discretion.     Again, thank you for choosing [facility].  Our hope is that these requests will decrease the amount of time that you wait before being seen by our physicians.       _____________________________________________________________  Should you have questions after your visit to  Cancer Center, please contact our office at [phone] between the hours of 8:00 a.m. and 4:30 p.m.  Voicemails left after 4:00 p.m. will not be returned until the following business day.  For prescription refill requests, have your pharmacy contact our office and allow 72 hours.    Due to Covid, you will need to wear a mask upon entering the hospital. If you do not have a mask, a mask will be given to you at the Main Entrance upon arrival. For doctor visits, patients may have 1 support person with them. For treatment visits, patients can not have anyone with them due to social distancing guidelines and our immunocompromised population.      

## 2020-01-02 IMAGING — MG DIGITAL DIAGNOSTIC BILATERAL MAMMOGRAM WITH TOMO AND CAD
9 of 13 series · 9 of 29 positions shown · non-contrast
Comparison: 02/06/2017 and earlier

CLINICAL DATA: History of RIGHT lumpectomy in January 2017.
Patient declined radiation and chemotherapy.

EXAM:
DIGITAL DIAGNOSTIC BILATERAL MAMMOGRAM WITH CAD AND TOMO

[R ML (1 of 2)]
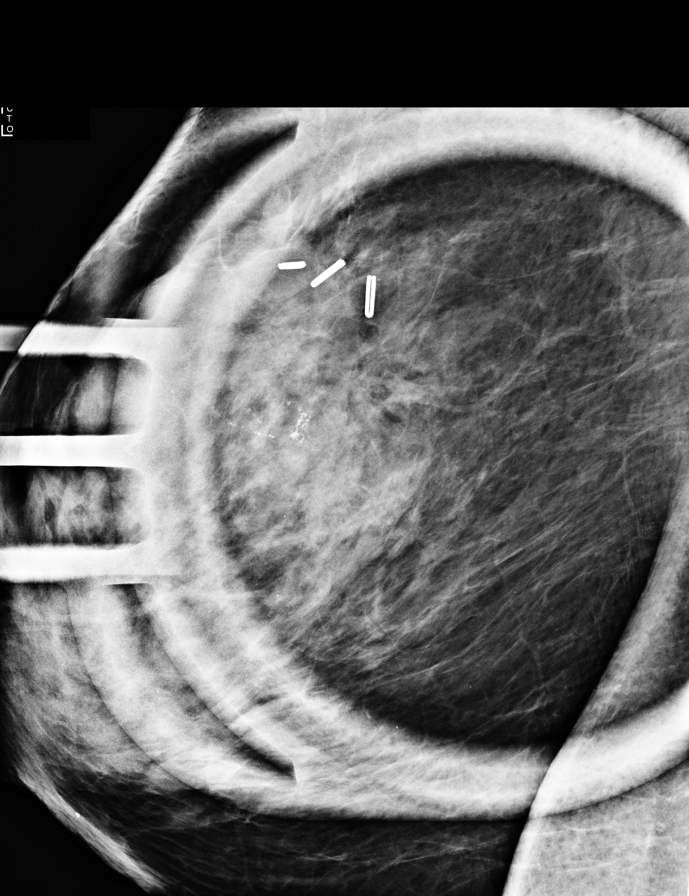

[L ML]
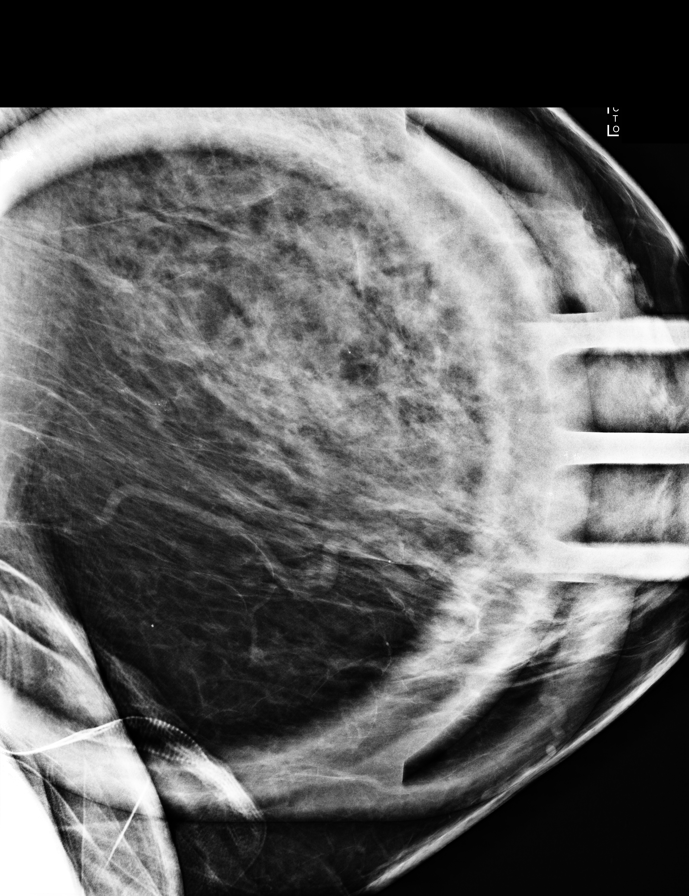

[L CC]
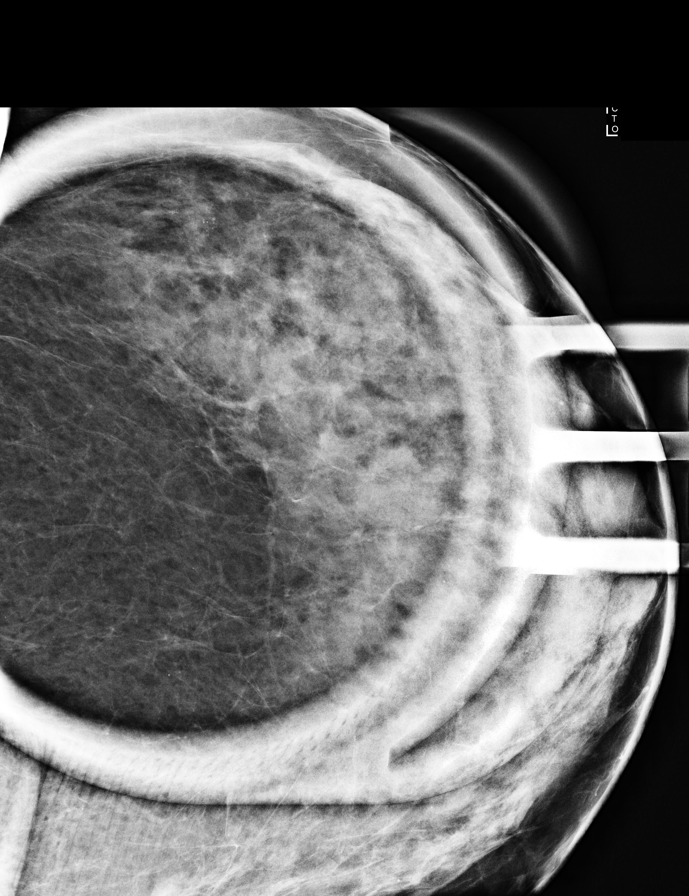

[R ML (2 of 2)]
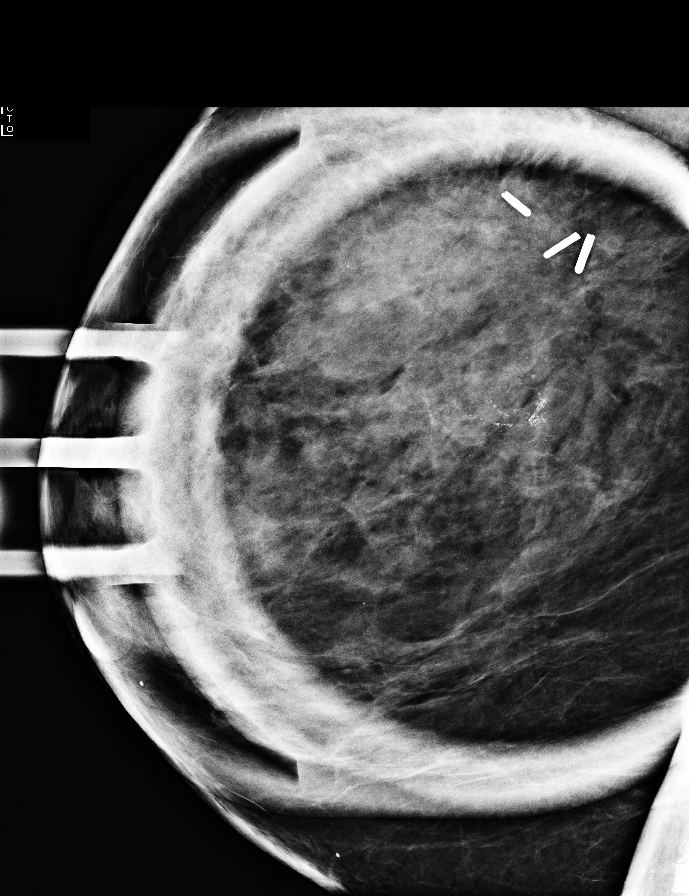

[R CC]
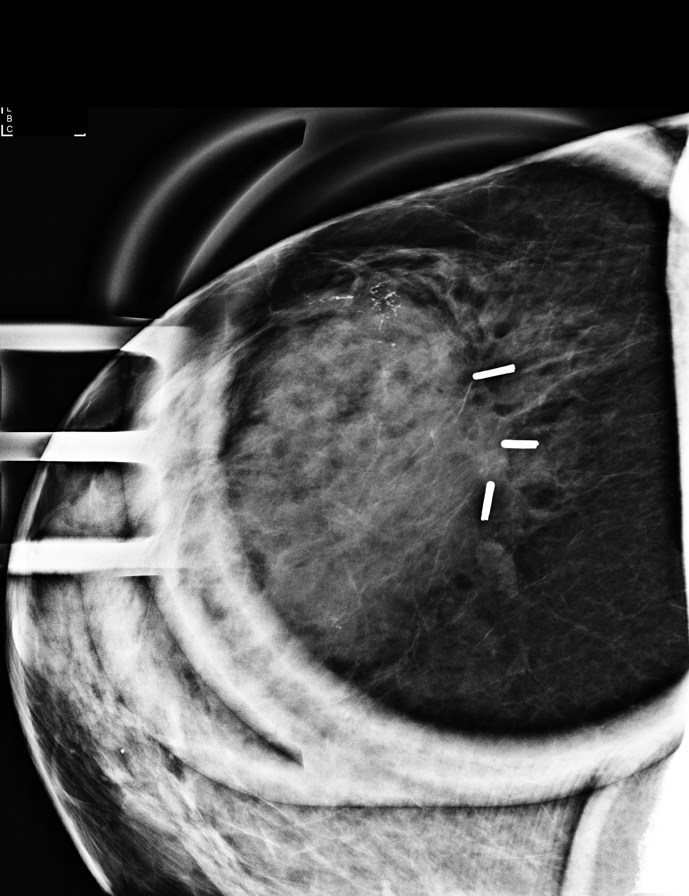

[R CC synth-2D]
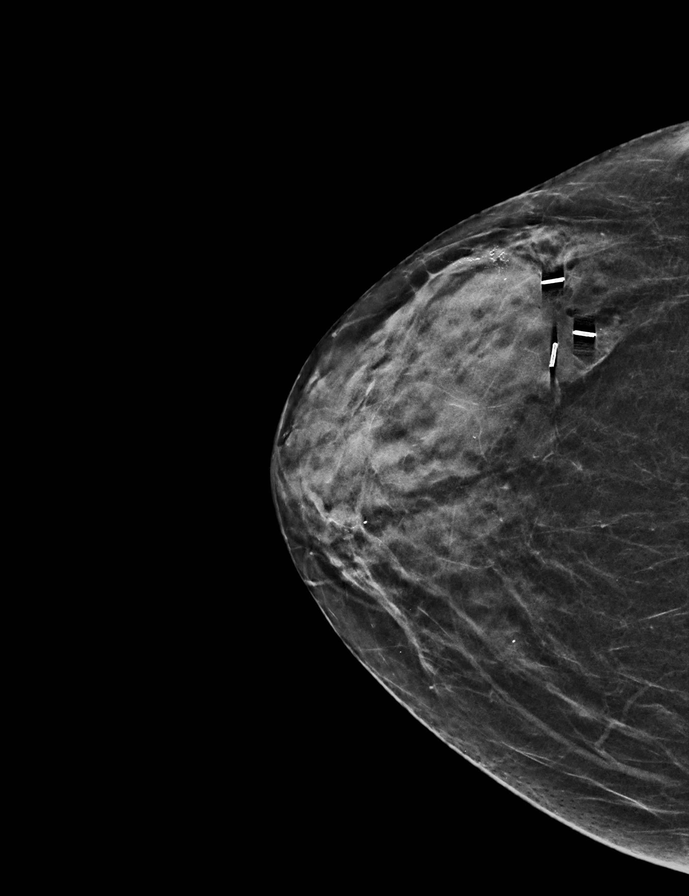

[L MLO synth-2D]
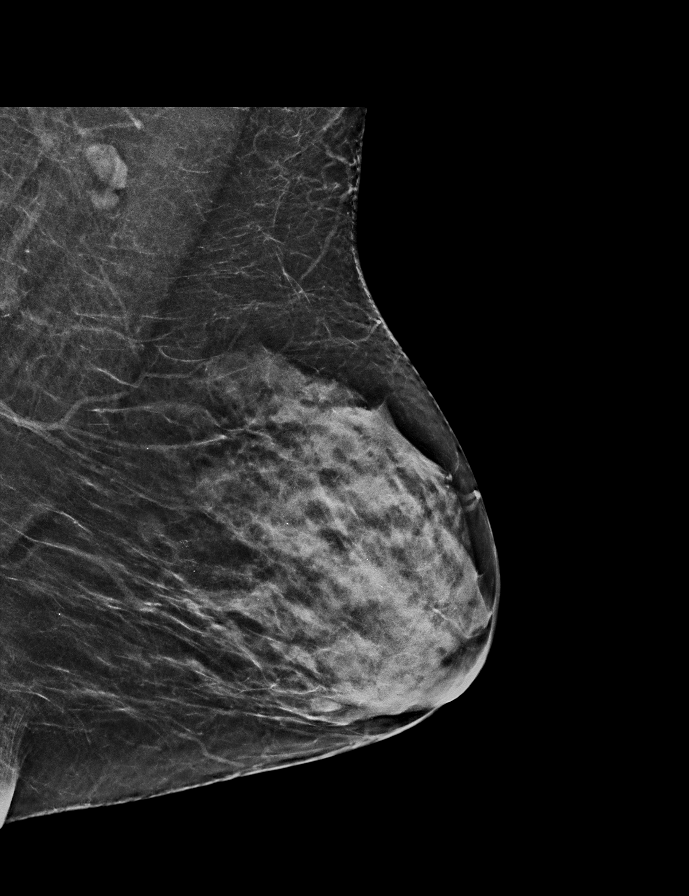

[R MLO synth-2D]
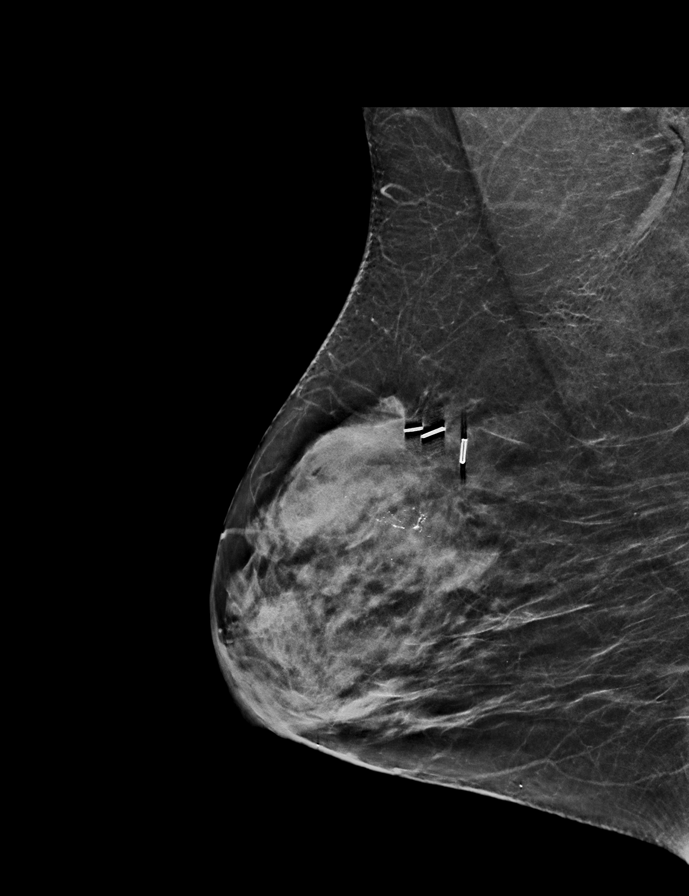

[L CC synth-2D]
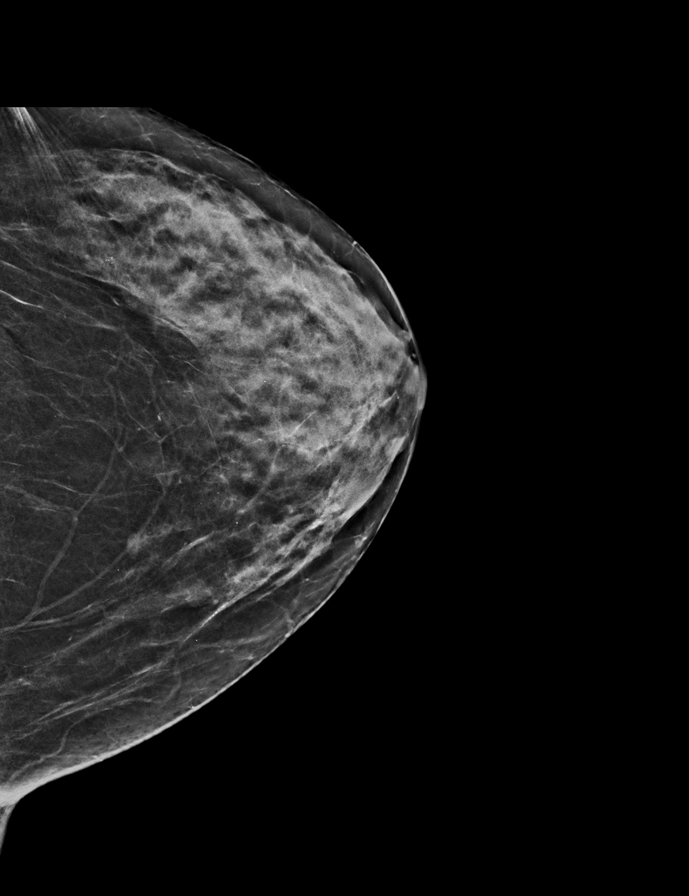

[9 of 29 positions shown; findings below may reference images not displayed]

ACR Breast Density Category c: The breast tissue is heterogeneously
dense, which may obscure small masses.
FINDINGS: Post operative changes are seen in the UPPER-OUTER QUADRANT of the
RIGHT breast. Just LATERAL and anterior to the lumpectomy site,
there is a group of fine linear and branching calcifications which
measures 1.4 x 1.0 x 1.0 centimeters. In the LATERAL portion of the
LEFT breast, there is a group of fine pleomorphic calcifications
which measures 0.7 x 0.7 x 0.3 centimeters.

Mammographic images were processed with CAD.
IMPRESSION: Indeterminate calcifications bilaterally. Tissue diagnosis is
recommended.

RECOMMENDATION:
Bilateral stereotactic guided core biopsies.

I have discussed the findings and recommendations with the patient.
Results were also provided in writing at the conclusion of the
visit. If applicable, a reminder letter will be sent to the patient
regarding the next appointment.

BI-RADS CATEGORY  4: Suspicious.

## 2020-02-03 ENCOUNTER — Other Ambulatory Visit (HOSPITAL_COMMUNITY): Payer: Self-pay | Admitting: Surgery

## 2020-02-03 DIAGNOSIS — D0511 Intraductal carcinoma in situ of right breast: Secondary | ICD-10-CM

## 2020-02-04 ENCOUNTER — Other Ambulatory Visit: Payer: Self-pay

## 2020-02-04 ENCOUNTER — Inpatient Hospital Stay (HOSPITAL_COMMUNITY): Payer: Medicare Other | Attending: Hematology

## 2020-02-04 DIAGNOSIS — M129 Arthropathy, unspecified: Secondary | ICD-10-CM | POA: Insufficient documentation

## 2020-02-04 DIAGNOSIS — Z170 Estrogen receptor positive status [ER+]: Secondary | ICD-10-CM | POA: Insufficient documentation

## 2020-02-04 DIAGNOSIS — F419 Anxiety disorder, unspecified: Secondary | ICD-10-CM | POA: Insufficient documentation

## 2020-02-04 DIAGNOSIS — E785 Hyperlipidemia, unspecified: Secondary | ICD-10-CM | POA: Insufficient documentation

## 2020-02-04 DIAGNOSIS — Z79899 Other long term (current) drug therapy: Secondary | ICD-10-CM | POA: Diagnosis not present

## 2020-02-04 DIAGNOSIS — C50411 Malignant neoplasm of upper-outer quadrant of right female breast: Secondary | ICD-10-CM | POA: Insufficient documentation

## 2020-02-04 DIAGNOSIS — E559 Vitamin D deficiency, unspecified: Secondary | ICD-10-CM | POA: Insufficient documentation

## 2020-02-04 DIAGNOSIS — D0511 Intraductal carcinoma in situ of right breast: Secondary | ICD-10-CM

## 2020-02-04 DIAGNOSIS — M810 Age-related osteoporosis without current pathological fracture: Secondary | ICD-10-CM | POA: Insufficient documentation

## 2020-02-04 LAB — COMPREHENSIVE METABOLIC PANEL
ALT: 19 U/L (ref 0–44)
AST: 22 U/L (ref 15–41)
Albumin: 3.9 g/dL (ref 3.5–5.0)
Alkaline Phosphatase: 60 U/L (ref 38–126)
Anion gap: 8 (ref 5–15)
BUN: 11 mg/dL (ref 8–23)
CO2: 27 mmol/L (ref 22–32)
Calcium: 9.2 mg/dL (ref 8.9–10.3)
Chloride: 102 mmol/L (ref 98–111)
Creatinine, Ser: 0.78 mg/dL (ref 0.44–1.00)
GFR, Estimated: >60 mL/min (ref >60)
Glucose, Bld: 182 mg/dL — ABNORMAL HIGH (ref 70–99)
Potassium: 4.8 mmol/L (ref 3.5–5.1)
Sodium: 137 mmol/L (ref 135–145)
Total Bilirubin: 0.8 mg/dL (ref 0.3–1.2)
Total Protein: 6.9 g/dL (ref 6.5–8.1)

## 2020-02-04 LAB — CBC WITH DIFFERENTIAL/PLATELET
Abs Immature Granulocytes: 0.02 10*3/uL (ref 0.00–0.07)
Basophils Absolute: 0.1 10*3/uL (ref 0.0–0.1)
Basophils Relative: 1 %
Eosinophils Absolute: 0.2 10*3/uL (ref 0.0–0.5)
Eosinophils Relative: 2 %
HCT: 41.8 % (ref 36.0–46.0)
Hemoglobin: 13.7 g/dL (ref 12.0–15.0)
Immature Granulocytes: 0 %
Lymphocytes Relative: 47 %
Lymphs Abs: 3.7 10*3/uL (ref 0.7–4.0)
MCH: 31.6 pg (ref 26.0–34.0)
MCHC: 32.8 g/dL (ref 30.0–36.0)
MCV: 96.3 fL (ref 80.0–100.0)
Monocytes Absolute: 0.9 10*3/uL (ref 0.1–1.0)
Monocytes Relative: 12 %
Neutro Abs: 3.1 10*3/uL (ref 1.7–7.7)
Neutrophils Relative %: 38 %
Platelets: 330 10*3/uL (ref 150–400)
RBC: 4.34 MIL/uL (ref 3.87–5.11)
RDW: 13.3 % (ref 11.5–15.5)
WBC: 8 10*3/uL (ref 4.0–10.5)
nRBC: 0 % (ref 0.0–0.2)

## 2020-02-04 LAB — VITAMIN D 25 HYDROXY (VIT D DEFICIENCY, FRACTURES): Vit D, 25-Hydroxy: 27.99 ng/mL — ABNORMAL LOW (ref 30–100)

## 2020-02-04 LAB — VITAMIN B12: Vitamin B-12: 404 pg/mL (ref 180–914)

## 2020-02-04 LAB — LACTATE DEHYDROGENASE: LDH: 118 U/L (ref 98–192)

## 2020-02-10 ENCOUNTER — Inpatient Hospital Stay (HOSPITAL_BASED_OUTPATIENT_CLINIC_OR_DEPARTMENT_OTHER): Payer: Medicare Other | Admitting: Oncology

## 2020-02-10 ENCOUNTER — Other Ambulatory Visit: Payer: Self-pay

## 2020-02-10 ENCOUNTER — Other Ambulatory Visit (HOSPITAL_COMMUNITY): Payer: Self-pay | Admitting: Oncology

## 2020-02-10 ENCOUNTER — Encounter (HOSPITAL_COMMUNITY): Payer: Self-pay | Admitting: Oncology

## 2020-02-10 VITALS — BP 128/77 | HR 67 | Temp 97.3°F | Resp 18 | Wt 128.7 lb

## 2020-02-10 DIAGNOSIS — C50411 Malignant neoplasm of upper-outer quadrant of right female breast: Secondary | ICD-10-CM

## 2020-02-10 DIAGNOSIS — Z170 Estrogen receptor positive status [ER+]: Secondary | ICD-10-CM | POA: Diagnosis not present

## 2020-02-10 NOTE — Progress Notes (Signed)
[place], [place] [address]   CLINIC:  Medical Oncology/Hematology  PCP:  Asencion Noble, [address] [place] [address] [phone]   REASON FOR VISIT: Follow-up for breast cancer   CURRENT THERAPY: Declined therapy  BRIEF ONCOLOGIC HISTORY:  Oncology History  Breast cancer of upper-outer quadrant of right female breast ([place])  04/02/2018 Initial Diagnosis   Breast cancer of upper-outer quadrant of right female breast ([place])   08/14/2019 Cancer Staging   Staging form: Breast, AJCC 8th Edition - Clinical stage from 08/14/2019: Stage IIA (rcT1b, cN1, cM0, G1, ER+, PR-, HER2-) - Signed by Derek Jack, MD on 08/14/2019     CANCER STAGING: Cancer Staging Breast cancer of upper-outer quadrant of right female breast [facility]) Staging form: Breast, AJCC 8th Edition - Clinical stage from 08/14/2019: Stage IIA (rcT1b, cN1, cM0, G1, ER+, PR-, HER2-) - Signed by Derek Jack, MD on 08/14/2019    INTERVAL HISTORY:  [name] 84 y.o. female returns for routine follow-up for breast cancer. Patient reports she is doing well since her last visit. She denies any new lumps or bumps present. Her recent mastectomy scar is very well-healed.  States she had very little pain after her mastectomy and only required Tylenol.  Denies any nausea, vomiting, or diarrhea. Denies any new pains. Had not noticed any recent bleeding such as epistaxis, hematuria or hematochezia. Denies recent chest pain on exertion, shortness of breath on minimal exertion, pre-syncopal episodes, or palpitations. Denies any numbness or tingling in hands or feet. Denies any recent fevers, infections, or recent hospitalizations. Patient reports appetite at 100% and energy level at 100%.     REVIEW OF SYSTEMS:  Review of Systems  All other systems reviewed and are negative.    PAST MEDICAL/SURGICAL HISTORY:  Past Medical History:  Diagnosis Date  . Anxiety   .  Arthritis   . Breast cancer [place] 2018  . Cancer [facility])    breast cancer  . Concussion    hit by a softball on left side of head  . Hyperlipidemia   . Osteoporosis   . Pericarditis 1961  . PONV (postoperative nausea and vomiting)   . Thyroiditis 1971   Past Surgical History:  Procedure Laterality Date  . ABDOMINAL HYSTERECTOMY  1985  . ABDOMINAL SURGERY    . APPENDECTOMY    . BOWEL RESECTION  10/31/2011   Procedure: SMALL BOWEL RESECTION;  Surgeon: Donato Heinz, MD;  Location: [facility];  Service: General;;  . BREAST LUMPECTOMY Right   . BREAST LUMPECTOMY WITH RADIOACTIVE SEED LOCALIZATION Right 02/07/2017   Procedure: BREAST LUMPECTOMY WITH RADIOACTIVE SEED LOCALIZATION;  Surgeon: Rolm Bookbinder, MD;  Location: [facility];  Service: General;  Laterality: Right;  . BREAST LUMPECTOMY WITH RADIOACTIVE SEED LOCALIZATION Right 02/27/2018   Procedure: RIGHT BREAST LUMPECTOMY WITH RADIOACTIVE SEED LOCALIZATION;  Surgeon: Rolm Bookbinder, MD;  Location: [facility];  Service: General;  Laterality: Right;  . FISSURECTOMY    . 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 LAPAROTOMY  10/31/2011   Procedure: EXPLORATORY LAPAROTOMY;  Surgeon: Donato Heinz, MD;  Location: [facility];  Service: General;  Laterality: N/A;  . NASAL SEPTUM SURGERY  1990  . NASAL SINUS SURGERY    . SIMPLE MASTECTOMY WITH AXILLARY SENTINEL NODE BIOPSY Right 06/11/2019   Procedure: RIGHT MASTECTOMY;  Surgeon: Rolm Bookbinder, MD;  Location: [facility];  Service: General;  Laterality: Right;  . TUBAL LIGATION  SOCIAL HISTORY:  Social History   Socioeconomic History  . Marital status: Widowed    Spouse name: Not on file  . Number of children: Not on file  . Years of education: Not on file  . Highest education level: Not on file  Occupational History  . Not on file  Tobacco Use  . Smoking status: Never Smoker  . Smokeless tobacco: Never Used  Vaping Use  . Vaping Use: Never used  Substance and  Sexual Activity  . Alcohol use: No  . Drug use: No  . Sexual activity: Never  Other Topics Concern  . Not on file  Social History Narrative  . Not on file   Social Determinants of Health   Financial Resource Strain:   . Difficulty of Paying Living Expenses: Not on file  Food Insecurity:   . Worried About Charity fundraiser in the Last Year: Not on file  . Ran Out of Food in the Last Year: Not on file  Transportation Needs:   . Lack of Transportation (Medical): Not on file  . Lack of Transportation (Non-Medical): Not on file  Physical Activity:   . Days of Exercise per Week: Not on file  . Minutes of Exercise per Session: Not on file  Stress:   . Feeling of Stress : Not on file  Social Connections:   . Frequency of Communication with Friends and Family: Not on file  . Frequency of Social Gatherings with Friends and Family: Not on file  . Attends Religious Services: Not on file  . Active Member of Clubs or Organizations: Not on file  . Attends Archivist Meetings: Not on file  . Marital Status: Not on file  Intimate Partner Violence:   . Fear of Current or Ex-Partner: Not on file  . Emotionally Abused: Not on file  . Physically Abused: Not on file  . Sexually Abused: Not on file    FAMILY HISTORY:  Family History  Problem Relation Age of Onset  . Diabetes Brother   . Hypertension Son   . Ulcers Sister     CURRENT MEDICATIONS:  Outpatient Encounter Medications as of 02/10/2020  Medication Sig  . acetaminophen (TYLENOL) 500 MG tablet Take 500 mg by mouth every 6 (six) hours as needed for moderate pain or headache.   . Carboxymethylcellulose Sodium (THERATEARS OP) Place 1 drop into both eyes daily as needed (dry eyes).  . LORazepam (ATIVAN) 1 MG tablet Take 1 mg by mouth 3 (three) times daily.   . polyethylene glycol (MIRALAX / GLYCOLAX) packet Take 17 g by mouth every morning.  . tamoxifen (NOLVADEX) 20 MG tablet Take 1 tablet (20 mg total) by mouth daily.  (Patient not taking: Reported on 02/10/2020)   No facility-administered encounter medications on file as of 02/10/2020.    ALLERGIES:  Allergies  Allergen Reactions  . Macrodantin [Nitrofurantoin Macrocrystal] Other (See Comments)    Unknown  . Nystatin     Pt can't remember what the allergy was   . Promethazine Other (See Comments)    Hallucinations  . Sulfa Antibiotics Other (See Comments)    [place]  . Suprax [Cefixime] Other (See Comments)    Unknown  . Penicillins Rash    Did it involve swelling of the face/tongue/throat, SOB, or low BP? Unknown Did it involve sudden or severe rash/hives, skin peeling, or any reaction on the inside of your mouth or nose? No Did you need to seek medical attention at a hospital or  doctor's office? Unknown When did it last happen?unknown If all above answers are "NO", may proceed with cephalosporin use.      PHYSICAL EXAM:  ECOG Performance status: 1  Vitals:   02/10/20 1351  BP: 128/77  Pulse: 67  Resp: 18  Temp: (!) 97.3 F (36.3 C)  SpO2: 97%   Filed Weights   02/10/20 1351  Weight: 128 lb 11.2 oz (58.4 kg)   Physical Exam Constitutional:      Appearance: Normal appearance. She is normal weight.  Cardiovascular:     Rate and Rhythm: Normal rate.     Heart sounds: Normal heart sounds.  Pulmonary:     Effort: Pulmonary effort is normal.     Breath sounds: Normal breath sounds.  Chest:     Breasts:        Right: Absent.   Abdominal:     General: Bowel sounds are normal.     Palpations: Abdomen is soft.  Musculoskeletal:        General: Normal range of motion.  Skin:    General: Skin is warm.  Neurological:     Mental Status: She is alert and oriented to person, place, and time. Mental status is at baseline.  Psychiatric:        Mood and Affect: Mood normal.        Behavior: Behavior normal.        Thought Content: Thought content normal.        Judgment: Judgment normal.      LABORATORY DATA:  I have  reviewed the labs as listed.  CBC    Component Value Date/Time   WBC 8.0 02/04/2020 1257   RBC 4.34 02/04/2020 1257   HGB 13.7 02/04/2020 1257   HCT 41.8 02/04/2020 1257   PLT 330 02/04/2020 1257   MCV 96.3 02/04/2020 1257   MCH 31.6 02/04/2020 1257   MCHC 32.8 02/04/2020 1257   RDW 13.3 02/04/2020 1257   LYMPHSABS 3.7 02/04/2020 1257   MONOABS 0.9 02/04/2020 1257   EOSABS 0.2 02/04/2020 1257   BASOSABS 0.1 02/04/2020 1257   CMP Latest Ref Rng & Units 02/04/2020 09/25/2019 04/09/2019  Glucose 70 - 99 mg/dL 182(H) 163(H) 112(H)  BUN 8 - 23 mg/dL _0 Creatinine 0.44 - 1.00 mg/dL 0.78 0.81 0.76  Sodium 135 - 145 mmol/L 137 138 136  Potassium 3.5 - 5.1 mmol/L 4.8 4.5 4.3  Chloride 98 - 111 mmol/L 102 101 101  CO2 22 - 32 mmol/L _1 Calcium 8.9 - 10.3 mg/dL 9.2 9.2 9.0  Total Protein 6.5 - 8.1 g/dL 6.9 6.9 7.2  Total Bilirubin 0.3 - 1.2 mg/dL 0.8 0.8 0.5  Alkaline Phos 38 - 126 U/L 60 63 71  AST 15 - 41 U/L _2 ALT 0 - 44 U/L _3 All questions were answered to patient's stated satisfaction. Encouraged patient to call with any new concerns or questions before his next visit to the cancer center and we can certain see him sooner, if needed.     ASSESSMENT & PLAN:  1.  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  Declined tamoxifen. -Right mastectomy on 06/11/2019 for recurrence.  0.6 cm IDC, grade 1, 1/2 lymph nodes positive with extracapsular extension.  ER was 100%.  PR 0%.  HER-2 was negative.  Ki-67  2%.  PT1BPN1A. -She was not interested in pursuing radiation therapy. -She started tamoxifen on 08/14/2019. -She stopped tamoxifen in the middle of June 2021 due to when she sat still she could feel her heartbeat. She had multiple EKGs at her primary care doctor's office and they were all normal. She does not want to go back on any  antiestrogen pills. -She would not like to try any other antihormone pills at this time. -Labs done on 02/04/2020 were all WNL -Mammogram of left breast in March 2022. -We will see her back after mammogram with repeat labs and assessment.   2.  Osteoporosis: -Bone density on 09/21/2017 showed T score of -3.2 in the right femoral neck. -Prolia was recommended in the past which was declined. -We have recommended calcium and vitamin D daily -She is noncompliant.  3.  Vitamin D deficiency: -Last vitamin D level was 27.99 -She is not taking any supplements at this time. -I have recommended she take calcium and vitamin D. -Patient states she drinks 2 ensures daily along with milk.    No problem-specific Assessment & Plan notes found for this encounter. Greater than 50% was spent in counseling and coordination of care with this patient including but not limited to discussion of the relevant topics above (See A&P) including, but not limited to diagnosis and management of acute and chronic medical conditions.    Orders placed this encounter:  Orders Placed This Encounter  Procedures  . MM Digital Diagnostic Unilat Luster Landsberg, NP 02/10/2020 2:39 PM  [place] [phone]

## 2020-02-10 NOTE — Progress Notes (Signed)
Pt has stopped taking her Tamoxifen due to her heart racing.  I will notify Rulon Abide, NP.

## 2020-02-11 ENCOUNTER — Ambulatory Visit (HOSPITAL_COMMUNITY): Payer: Medicare Other | Admitting: Nurse Practitioner

## 2020-06-16 ENCOUNTER — Ambulatory Visit (HOSPITAL_COMMUNITY)
Admission: RE | Admit: 2020-06-16 | Discharge: 2020-06-16 | Disposition: A | Discharge status: Home / Self Care | Payer: Medicare Other | Source: Ambulatory Visit | Attending: Oncology | Admitting: Oncology

## 2020-06-16 ENCOUNTER — Other Ambulatory Visit (HOSPITAL_COMMUNITY): Payer: Self-pay | Admitting: Oncology

## 2020-06-16 ENCOUNTER — Encounter (HOSPITAL_COMMUNITY): Payer: Self-pay

## 2020-06-16 DIAGNOSIS — Z1231 Encounter for screening mammogram for malignant neoplasm of breast: Secondary | ICD-10-CM | POA: Insufficient documentation

## 2020-06-16 DIAGNOSIS — Z170 Estrogen receptor positive status [ER+]: Secondary | ICD-10-CM | POA: Diagnosis present

## 2020-06-16 DIAGNOSIS — C50411 Malignant neoplasm of upper-outer quadrant of right female breast: Secondary | ICD-10-CM

## 2020-06-23 ENCOUNTER — Other Ambulatory Visit (HOSPITAL_COMMUNITY): Payer: Self-pay

## 2020-06-23 DIAGNOSIS — C50411 Malignant neoplasm of upper-outer quadrant of right female breast: Secondary | ICD-10-CM

## 2020-06-23 DIAGNOSIS — Z170 Estrogen receptor positive status [ER+]: Secondary | ICD-10-CM

## 2020-08-04 ENCOUNTER — Inpatient Hospital Stay (HOSPITAL_COMMUNITY): Payer: Medicare Other | Attending: Hematology

## 2020-08-04 DIAGNOSIS — Z79899 Other long term (current) drug therapy: Secondary | ICD-10-CM | POA: Insufficient documentation

## 2020-08-04 DIAGNOSIS — Z170 Estrogen receptor positive status [ER+]: Secondary | ICD-10-CM | POA: Insufficient documentation

## 2020-08-04 DIAGNOSIS — Z79810 Long term (current) use of selective estrogen receptor modulators (SERMs): Secondary | ICD-10-CM | POA: Diagnosis not present

## 2020-08-04 DIAGNOSIS — C50411 Malignant neoplasm of upper-outer quadrant of right female breast: Secondary | ICD-10-CM | POA: Insufficient documentation

## 2020-08-04 DIAGNOSIS — Z923 Personal history of irradiation: Secondary | ICD-10-CM | POA: Diagnosis not present

## 2020-08-04 DIAGNOSIS — M810 Age-related osteoporosis without current pathological fracture: Secondary | ICD-10-CM | POA: Diagnosis not present

## 2020-08-04 LAB — COMPREHENSIVE METABOLIC PANEL
ALT: 18 U/L (ref 0–44)
AST: 23 U/L (ref 15–41)
Albumin: 3.9 g/dL (ref 3.5–5.0)
Alkaline Phosphatase: 66 U/L (ref 38–126)
Anion gap: 6 (ref 5–15)
BUN: 10 mg/dL (ref 8–23)
CO2: 27 mmol/L (ref 22–32)
Calcium: 8.9 mg/dL (ref 8.9–10.3)
Chloride: 105 mmol/L (ref 98–111)
Creatinine, Ser: 0.71 mg/dL (ref 0.44–1.00)
GFR, Estimated: >60 mL/min (ref >60)
Glucose, Bld: 76 mg/dL (ref 70–99)
Potassium: 4.3 mmol/L (ref 3.5–5.1)
Sodium: 138 mmol/L (ref 135–145)
Total Bilirubin: 0.6 mg/dL (ref 0.3–1.2)
Total Protein: 6.8 g/dL (ref 6.5–8.1)

## 2020-08-04 LAB — CBC WITH DIFFERENTIAL/PLATELET
Abs Immature Granulocytes: 0.02 10*3/uL (ref 0.00–0.07)
Basophils Absolute: 0.1 10*3/uL (ref 0.0–0.1)
Basophils Relative: 1 %
Eosinophils Absolute: 0.3 10*3/uL (ref 0.0–0.5)
Eosinophils Relative: 3 %
HCT: 43.8 % (ref 36.0–46.0)
Hemoglobin: 14.4 g/dL (ref 12.0–15.0)
Immature Granulocytes: 0 %
Lymphocytes Relative: 43 %
Lymphs Abs: 3.9 10*3/uL (ref 0.7–4.0)
MCH: 31.6 pg (ref 26.0–34.0)
MCHC: 32.9 g/dL (ref 30.0–36.0)
MCV: 96.1 fL (ref 80.0–100.0)
Monocytes Absolute: 1.1 10*3/uL — ABNORMAL HIGH (ref 0.1–1.0)
Monocytes Relative: 12 %
Neutro Abs: 3.8 10*3/uL (ref 1.7–7.7)
Neutrophils Relative %: 41 %
Platelets: 307 10*3/uL (ref 150–400)
RBC: 4.56 MIL/uL (ref 3.87–5.11)
RDW: 13.7 % (ref 11.5–15.5)
WBC: 9.2 10*3/uL (ref 4.0–10.5)
nRBC: 0 % (ref 0.0–0.2)

## 2020-08-04 LAB — LACTATE DEHYDROGENASE: LDH: 131 U/L (ref 98–192)

## 2020-08-04 LAB — VITAMIN D 25 HYDROXY (VIT D DEFICIENCY, FRACTURES): Vit D, 25-Hydroxy: 27.73 ng/mL — ABNORMAL LOW (ref 30–100)

## 2020-08-05 LAB — CANCER ANTIGEN 15-3: CA 15-3: 78.3 U/mL — ABNORMAL HIGH (ref 0.0–25.0)

## 2020-08-10 NOTE — Progress Notes (Signed)
[facility] [address], [place] [address]   Patient Care Team: Asencion Noble, MD as PCP - General (Internal Medicine)  SUMMARY OF ONCOLOGIC HISTORY: Oncology History  Breast cancer of upper-outer quadrant of right female breast ([place])  04/02/2018 Initial Diagnosis   Breast cancer of upper-outer quadrant of right female breast ([place])   08/14/2019 Cancer Staging   Staging form: Breast, AJCC 8th Edition - Clinical stage from 08/14/2019: Stage IIA (rcT1b, cN1, cM0, G1, ER+, PR-, HER2-) - Signed by Derek Jack, MD on 08/14/2019     CHIEF COMPLIANT: Follow-up for breast cancer   INTERVAL HISTORY: [name] is a 85 y.o. female here today for follow up of her breast cancer. Her last visit was on 02/10/2020.   Today she reports feeling well. She reports no new aches or pains. She could not tolerate Tamoxifen. She is not currently taking vitamin D. She drinks 2-3 cans of Ensure daily.   REVIEW OF SYSTEMS:   Review of Systems  Constitutional: Positive for appetite change (75%) and fatigue (75%).  All other systems reviewed and are negative.   I have reviewed the past medical history, past surgical history, social history and family history with the patient and they are unchanged from previous note.   ALLERGIES:   is allergic to macrodantin [nitrofurantoin macrocrystal], nystatin, promethazine, sulfa antibiotics, suprax [cefixime], and penicillins.   MEDICATIONS:  Current Outpatient Medications  Medication Sig Dispense Refill  . acetaminophen (TYLENOL) 500 MG tablet Take 500 mg by mouth every 6 (six) hours as needed for moderate pain or headache.     . Carboxymethylcellulose Sodium (THERATEARS OP) Place 1 drop into both eyes daily as needed (dry eyes).    . LORazepam (ATIVAN) 1 MG tablet Take 1 mg by mouth 3 (three) times daily.     . polyethylene glycol (MIRALAX / GLYCOLAX) packet Take 17 g by mouth every morning.    . tamoxifen (NOLVADEX) 20 MG  tablet Take 1 tablet (20 mg total) by mouth daily. (Patient not taking: Reported on 02/10/2020) 30 tablet 6   No current facility-administered medications for this visit.     PHYSICAL EXAMINATION: Performance status (ECOG): 1 - Symptomatic but completely ambulatory  There were no vitals filed for this visit. Wt Readings from Last 3 Encounters:  02/10/20 128 lb 11.2 oz (58.4 kg)  10/08/19 128 lb (58.1 kg)  08/14/19 134 lb (60.8 kg)   Physical Exam Vitals reviewed.  Constitutional:      Appearance: Normal appearance.  Cardiovascular:     Rate and Rhythm: Normal rate and regular rhythm.     Pulses: Normal pulses.     Heart sounds: Normal heart sounds.  Pulmonary:     Effort: Pulmonary effort is normal.     Breath sounds: Normal breath sounds.  Chest:  Breasts:     Right: Absent. Skin change: well healed.     Left: Tenderness present. No inverted nipple, mass or nipple discharge.    Abdominal:     Palpations: Abdomen is soft. There is no hepatomegaly, splenomegaly or mass.     Tenderness: There is no abdominal tenderness.  Musculoskeletal:     Right lower leg: No edema.     Left lower leg: No edema.  Neurological:     General: No focal deficit present.     Mental Status: She is alert and oriented to person, place, and time.  Psychiatric:        Mood and Affect:  Mood normal.        Behavior: Behavior normal.     Breast Exam Chaperone: Thana Ates     LABORATORY DATA:  I have reviewed the data as listed CMP Latest Ref Rng & Units 08/04/2020 02/04/2020 09/25/2019  Glucose 70 - 99 mg/dL 76 182(H) 163(H)  BUN 8 - 23 mg/dL _0 Creatinine 0.44 - 1.00 mg/dL 0.71 0.78 0.81  Sodium 135 - 145 mmol/L 138 137 138  Potassium 3.5 - 5.1 mmol/L 4.3 4.8 4.5  Chloride 98 - 111 mmol/L 105 102 101  CO2 22 - 32 mmol/L _1 Calcium 8.9 - 10.3 mg/dL 8.9 9.2 9.2  Total Protein 6.5 - 8.1 g/dL 6.8 6.9 6.9  Total Bilirubin 0.3 - 1.2 mg/dL 0.6 0.8 0.8  Alkaline Phos 38 - 126  U/L 66 60 63  AST 15 - 41 U/L _2 ALT 0 - 44 U/L _3 Lab Results  Component Value Date   CAN153 78.3 (H) 08/04/2020   CAN153 37.3 (H) 09/25/2019   Lab Results  Component Value Date   WBC 9.2 08/04/2020   HGB 14.4 08/04/2020   HCT 43.8 08/04/2020   MCV 96.1 08/04/2020   PLT 307 08/04/2020   NEUTROABS 3.8 08/04/2020    ASSESSMENT:  1.  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  Declined tamoxifen. -Right mastectomy on 06/11/2019 for recurrence.  0.6 cm IDC, grade 1, 1/2 lymph nodes positive with extracapsular extension.  ER was 100%.  PR 0%.  HER-2 was negative.  Ki-67 2%.  PT1BPN1A. -She was not interested in pursuing radiation therapy. -She started tamoxifen on 08/14/2019. -She stopped tamoxifen in the middle of June 2021 due to when she sat still she could feel her heartbeat. She had multiple EKGs at her primary care doctor's office and they were all normal. She does not want to go back on any antiestrogen pills. -She would not like to try any other antihormone pills at this time.   2.  Osteoporosis: -Bone density on 09/21/2017 showed T score of -3.2 in the right femoral neck. -Prolia was recommended in the past which was declined.    PLAN:  1.  Stage I right breast invasive ductal carcinoma: - Physical examination today shows no palpable masses at right mastectomy site.  Left breast has no palpable masses.  She points a tender area in the left breast which was normal to palpation. - We reviewed mammogram from 06/16/2020 of the left breast which was BI-RADS Category 1. - I reviewed labs from 08/04/2020 which showed normal LFTs and CBC. - CA 15-3 has increased to 78.3 from 37.3 previously. - I plan to see her back in 3 months with repeat CA 15-3.  If it is continuing to go high, will consider imaging.  2.  Osteoporosis: - Her  vitamin D is 27.73.  She has declined Prolia in the past. - I have recommended her to start taking vitamin D daily.  Breast Cancer therapy associated bone loss: I have recommended calcium, Vitamin D and weight bearing exercises.  Orders placed this encounter:  No orders of the defined types were placed in this encounter.   The patient has a good understanding of the overall plan. She agrees with it. She will call with any problems that may develop before the next visit here.  Derek Jack, MD [facility] [phone]   I, Thana Ates, am acting as a scribe for Dr. Derek Jack.  I, Derek Jack MD, have reviewed the above documentation for accuracy and completeness, and I agree with the above.

## 2020-08-11 ENCOUNTER — Other Ambulatory Visit: Payer: Self-pay

## 2020-08-11 ENCOUNTER — Inpatient Hospital Stay (HOSPITAL_BASED_OUTPATIENT_CLINIC_OR_DEPARTMENT_OTHER): Payer: Medicare Other | Admitting: Hematology

## 2020-08-11 VITALS — BP 132/69 | HR 62 | Temp 97.0°F | Resp 17 | Wt 126.6 lb

## 2020-08-11 DIAGNOSIS — Z170 Estrogen receptor positive status [ER+]: Secondary | ICD-10-CM

## 2020-08-11 DIAGNOSIS — C50411 Malignant neoplasm of upper-outer quadrant of right female breast: Secondary | ICD-10-CM | POA: Diagnosis not present

## 2020-08-11 NOTE — Patient Instructions (Signed)
[facility] Discharge Instructions  You were seen today by Dr. Katragadda. He went over your recent results. Dr. Katragadda will see you back in 3 months for labs and follow up.   Thank you for choosing [facility] at [facility] to provide your oncology and hematology care.  To afford each patient quality time with our provider, please arrive at least 15 minutes before your scheduled appointment time.   If you have a lab appointment with the Cancer Center please come in thru the Main Entrance and check in at the main information desk  You need to re-schedule your appointment should you arrive 10 or more minutes late.  We strive to give you quality time with our providers, and arriving late affects you and other patients whose appointments are after yours.  Also, if you no show three or more times for appointments you may be dismissed from the clinic at the providers discretion.     Again, thank you for choosing [facility].  Our hope is that these requests will decrease the amount of time that you wait before being seen by our physicians.       _____________________________________________________________  Should you have questions after your visit to [facility], please contact our office at [phone] between the hours of 8:00 a.m. and 4:30 p.m.  Voicemails left after 4:00 p.m. will not be returned until the following business day.  For prescription refill requests, have your pharmacy contact our office and allow 72 hours.    Cancer Center Support Programs:   > Cancer Support Group  2nd Tuesday of the month 1pm-2pm, Journey Room   

## 2020-11-17 ENCOUNTER — Other Ambulatory Visit: Payer: Self-pay

## 2020-11-17 ENCOUNTER — Inpatient Hospital Stay (HOSPITAL_COMMUNITY): Payer: Medicare Other | Attending: Hematology

## 2020-11-17 DIAGNOSIS — K5900 Constipation, unspecified: Secondary | ICD-10-CM | POA: Insufficient documentation

## 2020-11-17 DIAGNOSIS — C50411 Malignant neoplasm of upper-outer quadrant of right female breast: Secondary | ICD-10-CM | POA: Diagnosis not present

## 2020-11-17 DIAGNOSIS — Z9011 Acquired absence of right breast and nipple: Secondary | ICD-10-CM | POA: Diagnosis not present

## 2020-11-17 DIAGNOSIS — M810 Age-related osteoporosis without current pathological fracture: Secondary | ICD-10-CM | POA: Diagnosis not present

## 2020-11-17 DIAGNOSIS — Z170 Estrogen receptor positive status [ER+]: Secondary | ICD-10-CM | POA: Insufficient documentation

## 2020-11-17 DIAGNOSIS — M546 Pain in thoracic spine: Secondary | ICD-10-CM | POA: Diagnosis not present

## 2020-11-17 DIAGNOSIS — Z79899 Other long term (current) drug therapy: Secondary | ICD-10-CM | POA: Insufficient documentation

## 2020-11-17 LAB — COMPREHENSIVE METABOLIC PANEL
ALT: 15 U/L (ref 0–44)
AST: 21 U/L (ref 15–41)
Albumin: 3.9 g/dL (ref 3.5–5.0)
Alkaline Phosphatase: 71 U/L (ref 38–126)
Anion gap: 7 (ref 5–15)
BUN: 10 mg/dL (ref 8–23)
CO2: 28 mmol/L (ref 22–32)
Calcium: 9.1 mg/dL (ref 8.9–10.3)
Chloride: 105 mmol/L (ref 98–111)
Creatinine, Ser: 0.73 mg/dL (ref 0.44–1.00)
GFR, Estimated: >60 mL/min (ref >60)
Glucose, Bld: 81 mg/dL (ref 70–99)
Potassium: 4.6 mmol/L (ref 3.5–5.1)
Sodium: 140 mmol/L (ref 135–145)
Total Bilirubin: 0.7 mg/dL (ref 0.3–1.2)
Total Protein: 6.9 g/dL (ref 6.5–8.1)

## 2020-11-17 LAB — CBC WITH DIFFERENTIAL/PLATELET
Abs Immature Granulocytes: 0.02 10*3/uL (ref 0.00–0.07)
Basophils Absolute: 0.1 10*3/uL (ref 0.0–0.1)
Basophils Relative: 1 %
Eosinophils Absolute: 0.2 10*3/uL (ref 0.0–0.5)
Eosinophils Relative: 3 %
HCT: 41.9 % (ref 36.0–46.0)
Hemoglobin: 14.3 g/dL (ref 12.0–15.0)
Immature Granulocytes: 0 %
Lymphocytes Relative: 50 %
Lymphs Abs: 4.2 10*3/uL — ABNORMAL HIGH (ref 0.7–4.0)
MCH: 32.8 pg (ref 26.0–34.0)
MCHC: 34.1 g/dL (ref 30.0–36.0)
MCV: 96.1 fL (ref 80.0–100.0)
Monocytes Absolute: 1 10*3/uL (ref 0.1–1.0)
Monocytes Relative: 12 %
Neutro Abs: 2.9 10*3/uL (ref 1.7–7.7)
Neutrophils Relative %: 34 %
Platelets: 308 10*3/uL (ref 150–400)
RBC: 4.36 MIL/uL (ref 3.87–5.11)
RDW: 13.6 % (ref 11.5–15.5)
WBC: 8.5 10*3/uL (ref 4.0–10.5)
nRBC: 0 % (ref 0.0–0.2)

## 2020-11-18 LAB — CANCER ANTIGEN 15-3: CA 15-3: 181 U/mL — ABNORMAL HIGH (ref 0.0–25.0)

## 2020-11-23 NOTE — Progress Notes (Signed)
[facility] [address], [place] [address]   Patient Care Team: Asencion Noble, MD as PCP - General (Internal Medicine)  SUMMARY OF ONCOLOGIC HISTORY: Oncology History  Breast cancer of upper-outer quadrant of right female breast ([place])  04/02/2018 Initial Diagnosis   Breast cancer of upper-outer quadrant of right female breast ([place])   08/14/2019 Cancer Staging   Staging form: Breast, AJCC 8th Edition - Clinical stage from 08/14/2019: Stage IIA (rcT1b, cN1, cM0, G1, ER+, PR-, HER2-) - Signed by Derek Jack, MD on 08/14/2019     CHIEF COMPLIANT: Follow-up for breast cancer   INTERVAL HISTORY: [name] is a 85 y.o. female here today for follow up of her breast cancer. Her last visit was on 08/11/2020.   Today she reports feeling good. She has lost 5 pounds over the past 3 months. She reports soreness on the right side of her chest as well as in her mid to upper back.   REVIEW OF SYSTEMS:   Review of Systems  Constitutional:  Positive for unexpected weight change (- 5 lbs). Negative for appetite change and fatigue (70%).  Gastrointestinal:  Positive for constipation.  Musculoskeletal:  Positive for arthralgias (3/10) and back pain (3/10).  All other systems reviewed and are negative.  I have reviewed the past medical history, past surgical history, social history and family history with the patient and they are unchanged from previous note.   ALLERGIES:   is allergic to macrodantin [nitrofurantoin macrocrystal], nystatin, promethazine, sulfa antibiotics, suprax [cefixime], and penicillins.   MEDICATIONS:  Current Outpatient Medications  Medication Sig Dispense Refill   acetaminophen (TYLENOL) 500 MG tablet Take 500 mg by mouth every 6 (six) hours as needed for moderate pain or headache.     Carboxymethylcellulose Sodium (THERATEARS OP) Place 1 drop into both eyes daily as needed (dry eyes).     LORazepam (ATIVAN) 1 MG tablet Take 1 mg by  mouth 3 (three) times daily.      polyethylene glycol (MIRALAX / GLYCOLAX) packet Take 17 g by mouth every morning.     tamoxifen (NOLVADEX) 20 MG tablet Take 1 tablet (20 mg total) by mouth daily. (Patient not taking: Reported on 11/24/2020) 30 tablet 6   No current facility-administered medications for this visit.     PHYSICAL EXAMINATION: Performance status (ECOG): 1 - Symptomatic but completely ambulatory  Vitals:   11/24/20 1356  BP: 124/61  Pulse: 70  Resp: 17  Temp: (!) 97 F (36.1 C)  SpO2: 97%   Wt Readings from Last 3 Encounters:  11/24/20 121 lb (54.9 kg)  08/11/20 126 lb 9.6 oz (57.4 kg)  02/10/20 128 lb 11.2 oz (58.4 kg)   Physical Exam Vitals reviewed.  Constitutional:      Appearance: Normal appearance.  Cardiovascular:     Rate and Rhythm: Normal rate and regular rhythm.     Pulses: Normal pulses.     Heart sounds: Normal heart sounds.  Pulmonary:     Effort: Pulmonary effort is normal.     Breath sounds: Normal breath sounds.  Chest:  Breasts:    Right: Tenderness (anterior ribs below mastectomy site) present. No mass.     Left: No mass.  Abdominal:     Palpations: Abdomen is soft. There is no hepatomegaly, splenomegaly or mass.     Tenderness: There is no abdominal tenderness.  Lymphadenopathy:     Upper Body:     Right upper body: Supraclavicular adenopathy (1 cm)  present. No axillary or pectoral adenopathy.     Left upper body: No supraclavicular, axillary or pectoral adenopathy.  Neurological:     General: No focal deficit present.     Mental Status: She is alert and oriented to person, place, and time.  Psychiatric:        Mood and Affect: Mood normal.        Behavior: Behavior normal.    Breast Exam Chaperone: Thana Ates     LABORATORY DATA:  I have reviewed the data as listed CMP Latest Ref Rng & Units 11/17/2020 08/04/2020 02/04/2020  Glucose 70 - 99 mg/dL 81 76 182(H)  BUN 8 - 23 mg/dL 10 10 11   Creatinine 0.44 - 1.00 mg/dL 0.73  0.71 0.78  Sodium 135 - 145 mmol/L 140 138 137  Potassium 3.5 - 5.1 mmol/L 4.6 4.3 4.8  Chloride 98 - 111 mmol/L 105 105 102  CO2 22 - 32 mmol/L 28 27 27   Calcium 8.9 - 10.3 mg/dL 9.1 8.9 9.2  Total Protein 6.5 - 8.1 g/dL 6.9 6.8 6.9  Total Bilirubin 0.3 - 1.2 mg/dL 0.7 0.6 0.8  Alkaline Phos 38 - 126 U/L 71 66 60  AST 15 - 41 U/L 21 23 22   ALT 0 - 44 U/L 15 18 19    Lab Results  Component Value Date   CAN153 181.0 (H) 11/17/2020   CAN153 78.3 (H) 08/04/2020   CAN153 37.3 (H) 09/25/2019   Lab Results  Component Value Date   WBC 8.5 11/17/2020   HGB 14.3 11/17/2020   HCT 41.9 11/17/2020   MCV 96.1 11/17/2020   PLT 308 11/17/2020   NEUTROABS 2.9 11/17/2020    ASSESSMENT:  1.  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  Declined tamoxifen. -Right mastectomy on 06/11/2019 for recurrence.  0.6 cm IDC, grade 1, 1/2 lymph nodes positive with extracapsular extension.  ER was 100%.  PR 0%.  HER-2 was negative.  Ki-67 2%.  PT1BPN1A. -She was not interested in pursuing radiation therapy. -She started tamoxifen on 08/14/2019. -She stopped tamoxifen in the middle of June 2021 due to when she sat still she could feel her heartbeat. She had multiple EKGs at her primary care doctor's office and they were all normal. She does not want to go back on any antiestrogen pills. -She would not like to try any other antihormone pills at this time.     2.  Osteoporosis: -Bone density on 09/21/2017 showed T score of -3.2 in the right femoral neck. -Prolia was recommended in the past which was declined.   PLAN:  1.  Stage I right breast invasive ductal carcinoma: - No palpable masses at the right mastectomy site.  Left breast has no palpable masses. - Reviewed mammogram of the left breast on 06/16/2020 which was BI-RADS Category 1. - There is right supraclavicular lymph  node.  She also complains new onset mid back pain noticed particularly when she goes to church. - Reviewed labs which showed normal LFTs and CBC.  CA 15-3 has increased to 181 from 78. - Recommend PET CT scan to evaluate for metastatic disease.  RTC after scan.   2.  Osteoporosis: - Continue vitamin D supplements.  Last vitamin D was 27.  She has declined Prolia in the past.  Breast Cancer therapy associated bone loss: I have recommended calcium, Vitamin D and weight bearing exercises.  Orders placed this encounter:  No orders of the defined types were placed in this encounter.   The patient has a good understanding of the overall plan. She agrees with it. She will call with any problems that may develop before the next visit here.  Derek Jack, MD [place] [phone]   I, Thana Ates, am acting as a scribe for Dr. Derek Jack.  I, Derek Jack MD, have reviewed the above documentation for accuracy and completeness, and I agree with the above.

## 2020-11-24 ENCOUNTER — Other Ambulatory Visit: Payer: Self-pay

## 2020-11-24 ENCOUNTER — Inpatient Hospital Stay (HOSPITAL_BASED_OUTPATIENT_CLINIC_OR_DEPARTMENT_OTHER): Payer: Medicare Other | Admitting: Hematology

## 2020-11-24 VITALS — BP 124/61 | HR 70 | Temp 97.0°F | Resp 17 | Wt 121.0 lb

## 2020-11-24 DIAGNOSIS — Z170 Estrogen receptor positive status [ER+]: Secondary | ICD-10-CM | POA: Diagnosis not present

## 2020-11-24 DIAGNOSIS — C50411 Malignant neoplasm of upper-outer quadrant of right female breast: Secondary | ICD-10-CM | POA: Diagnosis not present

## 2020-11-24 NOTE — Patient Instructions (Addendum)
[place] at [facility] Discharge Instructions  You were seen today by Dr. Delton Coombes. He went over your recent results and scans. You will be scheduled for a PET scan prior to your next visit. Dr. Delton Coombes will see you back in after your scan for labs and follow up.   Thank you for choosing [place] at [facility] to provide your oncology and hematology care.  To afford each patient quality time with our provider, please arrive at least 15 minutes before your scheduled appointment time.   If you have a lab appointment with the [facility] please come in thru the Main Entrance and check in at the main information desk  You need to re-schedule your appointment should you arrive 10 or more minutes late.  We strive to give you quality time with our providers, and arriving late affects you and other patients whose appointments are after yours.  Also, if you no show three or more times for appointments you may be dismissed from the clinic at the providers discretion.     Again, thank you for choosing [facility].  Our hope is that these requests will decrease the amount of time that you wait before being seen by our physicians.       _____________________________________________________________  Should you have questions after your visit to [facility], please contact our office at [phone] between the hours of 8:00 a.m. and 4:30 p.m.  Voicemails left after 4:00 p.m. will not be returned until the following business day.  For prescription refill requests, have your pharmacy contact our office and allow 72 hours.    Cancer Center Support Programs:   > Cancer Support Group  2nd Tuesday of the month 1pm-2pm, Journey Room

## 2020-11-24 NOTE — Progress Notes (Signed)
Patient is not taking tamoxifen as prescribed.

## 2020-12-08 ENCOUNTER — Other Ambulatory Visit (HOSPITAL_COMMUNITY): Payer: Self-pay | Admitting: *Deleted

## 2020-12-08 DIAGNOSIS — Z170 Estrogen receptor positive status [ER+]: Secondary | ICD-10-CM

## 2020-12-08 DIAGNOSIS — E559 Vitamin D deficiency, unspecified: Secondary | ICD-10-CM

## 2020-12-10 ENCOUNTER — Other Ambulatory Visit: Payer: Self-pay

## 2020-12-10 ENCOUNTER — Encounter (HOSPITAL_COMMUNITY)
Admission: RE | Admit: 2020-12-10 | Discharge: 2020-12-10 | Disposition: A | Discharge status: Home / Self Care | Payer: Medicare Other | Source: Ambulatory Visit | Attending: Hematology | Admitting: Hematology

## 2020-12-10 ENCOUNTER — Inpatient Hospital Stay (HOSPITAL_COMMUNITY): Payer: Medicare Other | Attending: Hematology

## 2020-12-10 DIAGNOSIS — Z170 Estrogen receptor positive status [ER+]: Secondary | ICD-10-CM | POA: Diagnosis present

## 2020-12-10 DIAGNOSIS — C7951 Secondary malignant neoplasm of bone: Secondary | ICD-10-CM | POA: Insufficient documentation

## 2020-12-10 DIAGNOSIS — E559 Vitamin D deficiency, unspecified: Secondary | ICD-10-CM

## 2020-12-10 DIAGNOSIS — M545 Low back pain, unspecified: Secondary | ICD-10-CM | POA: Insufficient documentation

## 2020-12-10 DIAGNOSIS — C50411 Malignant neoplasm of upper-outer quadrant of right female breast: Secondary | ICD-10-CM | POA: Insufficient documentation

## 2020-12-10 DIAGNOSIS — M810 Age-related osteoporosis without current pathological fracture: Secondary | ICD-10-CM | POA: Insufficient documentation

## 2020-12-10 DIAGNOSIS — Z9011 Acquired absence of right breast and nipple: Secondary | ICD-10-CM | POA: Insufficient documentation

## 2020-12-10 LAB — CBC WITH DIFFERENTIAL/PLATELET
Abs Immature Granulocytes: 0.02 10*3/uL (ref 0.00–0.07)
Basophils Absolute: 0.1 10*3/uL (ref 0.0–0.1)
Basophils Relative: 1 %
Eosinophils Absolute: 0.1 10*3/uL (ref 0.0–0.5)
Eosinophils Relative: 1 %
HCT: 46.8 % — ABNORMAL HIGH (ref 36.0–46.0)
Hemoglobin: 15.2 g/dL — ABNORMAL HIGH (ref 12.0–15.0)
Immature Granulocytes: 0 %
Lymphocytes Relative: 52 %
Lymphs Abs: 3.9 10*3/uL (ref 0.7–4.0)
MCH: 31.7 pg (ref 26.0–34.0)
MCHC: 32.5 g/dL (ref 30.0–36.0)
MCV: 97.5 fL (ref 80.0–100.0)
Monocytes Absolute: 0.9 10*3/uL (ref 0.1–1.0)
Monocytes Relative: 12 %
Neutro Abs: 2.6 10*3/uL (ref 1.7–7.7)
Neutrophils Relative %: 34 %
Platelets: 297 10*3/uL (ref 150–400)
RBC: 4.8 MIL/uL (ref 3.87–5.11)
RDW: 13.8 % (ref 11.5–15.5)
WBC: 7.6 10*3/uL (ref 4.0–10.5)
nRBC: 0 % (ref 0.0–0.2)

## 2020-12-10 LAB — COMPREHENSIVE METABOLIC PANEL
ALT: 17 U/L (ref 0–44)
AST: 25 U/L (ref 15–41)
Albumin: 4.5 g/dL (ref 3.5–5.0)
Alkaline Phosphatase: 73 U/L (ref 38–126)
Anion gap: 9 (ref 5–15)
BUN: 10 mg/dL (ref 8–23)
CO2: 25 mmol/L (ref 22–32)
Calcium: 9.2 mg/dL (ref 8.9–10.3)
Chloride: 104 mmol/L (ref 98–111)
Creatinine, Ser: 0.75 mg/dL (ref 0.44–1.00)
GFR, Estimated: >60 mL/min (ref >60)
Glucose, Bld: 79 mg/dL (ref 70–99)
Potassium: 4.1 mmol/L (ref 3.5–5.1)
Sodium: 138 mmol/L (ref 135–145)
Total Bilirubin: 1.1 mg/dL (ref 0.3–1.2)
Total Protein: 7.6 g/dL (ref 6.5–8.1)

## 2020-12-10 LAB — VITAMIN D 25 HYDROXY (VIT D DEFICIENCY, FRACTURES): Vit D, 25-Hydroxy: 30.46 ng/mL (ref 30–100)

## 2020-12-10 LAB — LACTATE DEHYDROGENASE: LDH: 166 U/L (ref 98–192)

## 2020-12-10 MED ORDER — FLUDEOXYGLUCOSE F - 18 (FDG) INJECTION
6.2400 | Freq: Once | INTRAVENOUS | Status: AC | PRN
  Administered 2020-12-10: 6.24 via INTRAVENOUS

## 2020-12-11 LAB — CANCER ANTIGEN 15-3: CA 15-3: 250 U/mL — ABNORMAL HIGH (ref 0.0–25.0)

## 2020-12-14 NOTE — Progress Notes (Signed)
[facility] [address] [place] [address]   Patient Care Team: Asencion Noble, MD as PCP - General (Internal Medicine)  SUMMARY OF ONCOLOGIC HISTORY: Oncology History  Breast cancer of upper-outer quadrant of right female breast ([place])  04/02/2018 Initial Diagnosis   Breast cancer of upper-outer quadrant of right female breast ([place])   08/14/2019 Cancer Staging   Staging form: Breast, AJCC 8th Edition - Clinical stage from 08/14/2019: Stage IIA (rcT1b, cN1, cM0, G1, ER+, PR-, HER2-) - Signed by Derek Jack, MD on 08/14/2019     CHIEF COMPLIANT: Follow-up of breast cancer   INTERVAL HISTORY: [name] is a 85 y.o. female here today for follow up of her breast cancer. Her last visit was on 11/24/2020.   Today she reports feeling good, and she is accompanied by her daughter-in-law. She reports occasional back pain while seated at church. She denies rectal pain, hematuria, and hematochezia.   REVIEW OF SYSTEMS:   Review of Systems  Constitutional:  Negative for appetite change and fatigue (80%).  Cardiovascular:  Positive for palpitations (occasional skipping).  Gastrointestinal:  Negative for blood in stool and rectal pain.  Genitourinary:  Negative for hematuria.   Musculoskeletal:  Positive for back pain.  Psychiatric/Behavioral:  The patient is nervous/anxious.   All other systems reviewed and are negative.  I have reviewed the past medical history, past surgical history, social history and family history with the patient and they are unchanged from previous note.   ALLERGIES:   is allergic to macrodantin [nitrofurantoin macrocrystal], nystatin, promethazine, sulfa antibiotics, suprax [cefixime], and penicillins.   MEDICATIONS:  Current Outpatient Medications  Medication Sig Dispense Refill   acetaminophen (TYLENOL) 500 MG tablet Take 500 mg by mouth every 6 (six) hours as needed for moderate pain or headache.     Carboxymethylcellulose  Sodium (THERATEARS OP) Place 1 drop into both eyes daily as needed (dry eyes).     LORazepam (ATIVAN) 1 MG tablet Take 1 mg by mouth 3 (three) times daily.      polyethylene glycol (MIRALAX / GLYCOLAX) packet Take 17 g by mouth every morning.     tamoxifen (NOLVADEX) 20 MG tablet Take 1 tablet (20 mg total) by mouth daily. (Patient not taking: Reported on 11/24/2020) 30 tablet 6   No current facility-administered medications for this visit.     PHYSICAL EXAMINATION: Performance status (ECOG): 1 - Symptomatic but completely ambulatory  There were no vitals filed for this visit. Wt Readings from Last 3 Encounters:  11/24/20 121 lb (54.9 kg)  08/11/20 126 lb 9.6 oz (57.4 kg)  02/10/20 128 lb 11.2 oz (58.4 kg)   Physical Exam Vitals reviewed.  Constitutional:      Appearance: Normal appearance.  Cardiovascular:     Rate and Rhythm: Normal rate and regular rhythm.     Pulses: Normal pulses.     Heart sounds: Normal heart sounds.  Pulmonary:     Effort: Pulmonary effort is normal.     Breath sounds: Normal breath sounds.  Neurological:     General: No focal deficit present.     Mental Status: She is alert and oriented to person, place, and time.  Psychiatric:        Mood and Affect: Mood normal.        Behavior: Behavior normal.    Breast Exam Chaperone: Thana Ates     LABORATORY DATA:  I have reviewed the data as listed CMP Latest Ref Rng &  Units 12/10/2020 11/17/2020 08/04/2020  Glucose 70 - 99 mg/dL 79 81 76  BUN 8 - 23 mg/dL 10 10 10   Creatinine 0.44 - 1.00 mg/dL 0.75 0.73 0.71  Sodium 135 - 145 mmol/L 138 140 138  Potassium 3.5 - 5.1 mmol/L 4.1 4.6 4.3  Chloride 98 - 111 mmol/L 104 105 105  CO2 22 - 32 mmol/L 25 28 27   Calcium 8.9 - 10.3 mg/dL 9.2 9.1 8.9  Total Protein 6.5 - 8.1 g/dL 7.6 6.9 6.8  Total Bilirubin 0.3 - 1.2 mg/dL 1.1 0.7 0.6  Alkaline Phos 38 - 126 U/L 73 71 66  AST 15 - 41 U/L 25 21 23   ALT 0 - 44 U/L 17 15 18    Lab Results  Component Value  Date   CAN153 250.0 (H) 12/10/2020   CAN153 181.0 (H) 11/17/2020   CAN153 78.3 (H) 08/04/2020   Lab Results  Component Value Date   WBC 7.6 12/10/2020   HGB 15.2 (H) 12/10/2020   HCT 46.8 (H) 12/10/2020   MCV 97.5 12/10/2020   PLT 297 12/10/2020   NEUTROABS 2.6 12/10/2020    ASSESSMENT:  1.  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  Declined tamoxifen. -Right mastectomy on 06/11/2019 for recurrence.  0.6 cm IDC, grade 1, 1/2 lymph nodes positive with extracapsular extension.  ER was 100%.  PR 0%.  HER-2 was negative.  Ki-67 2%.  PT1BPN1A. -She was not interested in pursuing radiation therapy. -She started tamoxifen on 08/14/2019. -She stopped tamoxifen in the middle of June 2021 due to when she sat still she could feel her heartbeat. She had multiple EKGs at her primary care doctor's office and they were all normal. She does not want to go back on any antiestrogen pills. -She would not like to try any other antihormone pills at this time.     2.  Osteoporosis: -Bone density on 09/21/2017 showed T score of -3.2 in the right femoral neck. -Prolia was recommended in the past which was declined.   PLAN:  1.  Stage I right breast invasive ductal carcinoma: - We have reviewed images of the PET scan.  There are several areas of very mild activity, for example right fourth rib, T9 vertebral body and right iliac bone lesion.  SUV max is between 2 and 4. - Based on increasing tumor marker level, I have recommended right iliac lesion biopsy which is easily accessible by CT guidance. - She will come back after the biopsy to discuss further treatment options.   2.  Osteoporosis: - Continue vitamin D supplements.  Last vitamin D was 30.  She has declined Prolia in the past.  Breast Cancer therapy associated bone loss: I have recommended calcium, Vitamin D and  weight bearing exercises.  Orders placed this encounter:  No orders of the defined types were placed in this encounter.   The patient has a good understanding of the overall plan. She agrees with it. She will call with any problems that may develop before the next visit here.  Derek Jack, MD [place] [phone]   I, Thana Ates, am acting as a scribe for Dr. Derek Jack.  I, Derek Jack MD, have reviewed the above documentation for accuracy and completeness, and I agree with the above.

## 2020-12-15 ENCOUNTER — Other Ambulatory Visit: Payer: Self-pay

## 2020-12-15 ENCOUNTER — Inpatient Hospital Stay (HOSPITAL_BASED_OUTPATIENT_CLINIC_OR_DEPARTMENT_OTHER): Payer: Medicare Other | Admitting: Hematology

## 2020-12-15 VITALS — BP 120/72 | HR 78 | Temp 97.1°F | Resp 18 | Wt 119.2 lb

## 2020-12-15 DIAGNOSIS — C7951 Secondary malignant neoplasm of bone: Secondary | ICD-10-CM | POA: Diagnosis not present

## 2020-12-15 DIAGNOSIS — Z170 Estrogen receptor positive status [ER+]: Secondary | ICD-10-CM | POA: Diagnosis not present

## 2020-12-15 DIAGNOSIS — C50411 Malignant neoplasm of upper-outer quadrant of right female breast: Secondary | ICD-10-CM

## 2020-12-15 DIAGNOSIS — M545 Low back pain, unspecified: Secondary | ICD-10-CM | POA: Diagnosis not present

## 2020-12-15 DIAGNOSIS — M810 Age-related osteoporosis without current pathological fracture: Secondary | ICD-10-CM | POA: Diagnosis not present

## 2020-12-15 DIAGNOSIS — Z9011 Acquired absence of right breast and nipple: Secondary | ICD-10-CM | POA: Diagnosis not present

## 2020-12-15 NOTE — Patient Instructions (Addendum)
[place] at [facility] Discharge Instructions  You were seen today by Dr. Delton Coombes. He went over your recent results and scans. You will be referred for a biopsy of your right hip bone. Dr. Delton Coombes will see you back in after the biopsy for labs and follow up.   Thank you for choosing [place] at [facility] to provide your oncology and hematology care.  To afford each patient quality time with our provider, please arrive at least 15 minutes before your scheduled appointment time.   If you have a lab appointment with the [facility] please come in thru the Main Entrance and check in at the main information desk  You need to re-schedule your appointment should you arrive 10 or more minutes late.  We strive to give you quality time with our providers, and arriving late affects you and other patients whose appointments are after yours.  Also, if you no show three or more times for appointments you may be dismissed from the clinic at the providers discretion.     Again, thank you for choosing [facility].  Our hope is that these requests will decrease the amount of time that you wait before being seen by our physicians.       _____________________________________________________________  Should you have questions after your visit to [facility], please contact our office at [phone] between the hours of 8:00 a.m. and 4:30 p.m.  Voicemails left after 4:00 p.m. will not be returned until the following business day.  For prescription refill requests, have your pharmacy contact our office and allow 72 hours.    Cancer Center Support Programs:   > Cancer Support Group  2nd Tuesday of the month 1pm-2pm, Journey Room

## 2020-12-16 ENCOUNTER — Encounter (HOSPITAL_COMMUNITY): Payer: Self-pay | Admitting: Radiology

## 2020-12-16 NOTE — Progress Notes (Signed)
Patient Name  Mikaiya, Tramble Legal Sex  Female DOB  1935-05-28 SSN  [id number] Address  [place]  [place] [address] Phone  [phone] [facility])  [phone] (Mobile) *Preferred*    RE: CT Biopsy Received: Today Sandi Mariscal, MD  Garth Bigness D OK for CT guided Bx of sclerotic lesion involving the right ilium - PET CT 12/10/20 - image 171, series 3.   Cathren Harsh        Previous Messages   ----- Message -----  From: Garth Bigness D  Sent: 12/15/2020   3:41 PM EDT  To: [facility] Procedure Requests  Subject: CT Biopsy                                       Procedure:   CT Biopsy   Reason:  Malignant neoplasm of upper-outer quadrant of right breast in female, estrogen receptor positive, Right iliac crest biopsy due to abnormal pet   History:  NM PET in computer   Provider:  Derek Jack   Provider Contact:  [phone]

## 2020-12-21 ENCOUNTER — Other Ambulatory Visit: Payer: Self-pay | Admitting: Radiology

## 2020-12-22 ENCOUNTER — Other Ambulatory Visit: Payer: Self-pay | Admitting: Radiology

## 2020-12-23 ENCOUNTER — Other Ambulatory Visit: Payer: Self-pay

## 2020-12-23 ENCOUNTER — Ambulatory Visit (HOSPITAL_COMMUNITY)
Admission: RE | Admit: 2020-12-23 | Discharge: 2020-12-23 | Disposition: A | Discharge status: Home / Self Care | Payer: Medicare Other | Source: Ambulatory Visit | Attending: Hematology | Admitting: Hematology

## 2020-12-23 DIAGNOSIS — C7951 Secondary malignant neoplasm of bone: Secondary | ICD-10-CM | POA: Diagnosis not present

## 2020-12-23 DIAGNOSIS — M899 Disorder of bone, unspecified: Secondary | ICD-10-CM | POA: Diagnosis present

## 2020-12-23 DIAGNOSIS — Z90411 Acquired partial absence of pancreas: Secondary | ICD-10-CM | POA: Diagnosis not present

## 2020-12-23 DIAGNOSIS — Z880 Allergy status to penicillin: Secondary | ICD-10-CM | POA: Insufficient documentation

## 2020-12-23 DIAGNOSIS — Z9011 Acquired absence of right breast and nipple: Secondary | ICD-10-CM | POA: Diagnosis not present

## 2020-12-23 DIAGNOSIS — Z881 Allergy status to other antibiotic agents status: Secondary | ICD-10-CM | POA: Diagnosis not present

## 2020-12-23 DIAGNOSIS — Z170 Estrogen receptor positive status [ER+]: Secondary | ICD-10-CM

## 2020-12-23 DIAGNOSIS — E785 Hyperlipidemia, unspecified: Secondary | ICD-10-CM | POA: Insufficient documentation

## 2020-12-23 DIAGNOSIS — Z79899 Other long term (current) drug therapy: Secondary | ICD-10-CM | POA: Insufficient documentation

## 2020-12-23 DIAGNOSIS — M1990 Unspecified osteoarthritis, unspecified site: Secondary | ICD-10-CM | POA: Diagnosis not present

## 2020-12-23 DIAGNOSIS — Z882 Allergy status to sulfonamides status: Secondary | ICD-10-CM | POA: Diagnosis not present

## 2020-12-23 DIAGNOSIS — Z853 Personal history of malignant neoplasm of breast: Secondary | ICD-10-CM | POA: Insufficient documentation

## 2020-12-23 DIAGNOSIS — M810 Age-related osteoporosis without current pathological fracture: Secondary | ICD-10-CM | POA: Insufficient documentation

## 2020-12-23 LAB — CBC WITH DIFFERENTIAL/PLATELET
Abs Immature Granulocytes: 0.02 10*3/uL (ref 0.00–0.07)
Basophils Absolute: 0.1 10*3/uL (ref 0.0–0.1)
Basophils Relative: 2 %
Eosinophils Absolute: 0.1 10*3/uL (ref 0.0–0.5)
Eosinophils Relative: 2 %
HCT: 41.2 % (ref 36.0–46.0)
Hemoglobin: 13.8 g/dL (ref 12.0–15.0)
Immature Granulocytes: 0 %
Lymphocytes Relative: 43 %
Lymphs Abs: 2.9 10*3/uL (ref 0.7–4.0)
MCH: 31.7 pg (ref 26.0–34.0)
MCHC: 33.5 g/dL (ref 30.0–36.0)
MCV: 94.7 fL (ref 80.0–100.0)
Monocytes Absolute: 0.9 10*3/uL (ref 0.1–1.0)
Monocytes Relative: 13 %
Neutro Abs: 2.7 10*3/uL (ref 1.7–7.7)
Neutrophils Relative %: 40 %
Platelets: 315 10*3/uL (ref 150–400)
RBC: 4.35 MIL/uL (ref 3.87–5.11)
RDW: 13.6 % (ref 11.5–15.5)
WBC: 6.8 10*3/uL (ref 4.0–10.5)
nRBC: 0 % (ref 0.0–0.2)

## 2020-12-23 LAB — BASIC METABOLIC PANEL
Anion gap: 7 (ref 5–15)
BUN: 8 mg/dL (ref 8–23)
CO2: 26 mmol/L (ref 22–32)
Calcium: 9.1 mg/dL (ref 8.9–10.3)
Chloride: 104 mmol/L (ref 98–111)
Creatinine, Ser: 0.76 mg/dL (ref 0.44–1.00)
GFR, Estimated: >60 mL/min (ref >60)
Glucose, Bld: 93 mg/dL (ref 70–99)
Potassium: 4.3 mmol/L (ref 3.5–5.1)
Sodium: 137 mmol/L (ref 135–145)

## 2020-12-23 LAB — PROTIME-INR
INR: 1.1 (ref 0.8–1.2)
Prothrombin Time: 13.9 seconds (ref 11.4–15.2)

## 2020-12-23 MED ORDER — FENTANYL CITRATE (PF) 100 MCG/2ML IJ SOLN
INTRAMUSCULAR | Status: AC
  Filled 2020-12-23: qty 2

## 2020-12-23 MED ORDER — FENTANYL CITRATE (PF) 100 MCG/2ML IJ SOLN
INTRAMUSCULAR | Status: DC | PRN
  Administered 2020-12-23: 25 ug via INTRAVENOUS

## 2020-12-23 MED ORDER — HYDROCODONE-ACETAMINOPHEN 5-325 MG PO TABS
1.0000 | ORAL_TABLET | ORAL | Status: DC | PRN
  Filled 2020-12-23: qty 2

## 2020-12-23 MED ORDER — LIDOCAINE HCL 1 % IJ SOLN
INTRAMUSCULAR | Status: AC
  Filled 2020-12-23: qty 10

## 2020-12-23 MED ORDER — SODIUM CHLORIDE 0.9 % IV SOLN: INTRAVENOUS | Status: DC

## 2020-12-23 MED ORDER — MIDAZOLAM HCL 2 MG/2ML IJ SOLN
INTRAMUSCULAR | Status: AC
  Filled 2020-12-23: qty 2

## 2020-12-23 MED ORDER — MIDAZOLAM HCL 2 MG/2ML IJ SOLN
INTRAMUSCULAR | Status: DC | PRN
  Administered 2020-12-23: 1 mg via INTRAVENOUS

## 2020-12-23 NOTE — Consult Note (Signed)
Chief Complaint: Patient was seen in consultation today for  image guided right ilium sclerotic lesion biopsy  Referring Physician(s): Katragadda,Sreedhar  Supervising Physician: Markus Daft  Patient Status: [facility] - Out-pt  History of Present Illness: Caitlin Alexander is an 85 y.o. female with PMH anxiety, HLD, osteoporosis, arthritis and right breast cancer in 2019, s/p lumpectomy followed by mastectomy. PET scan on 12/10/20 revealed :  1. No hypermetabolic cervical lymph nodes. 2. Mild metabolic activity associated thickened tissue in the RIGHT axilla is favored postsurgical inflammation following RIGHT axillary node dissection. 3. Several foci of activity in the skeleton skeleton. Most intense hypermetabolic activity is in anterior medial RIGHT fourth rib. More subtle lesions are present in the T9 vertebral body and RIGHT iliac bone. Low activity does not not favor metastatic breast cancer however estrogen positive breast cancer can have variable activity on FDG PET scan. (F 18 estradiol PET/CT scan may be of benefit). 4. Intense hypermetabolic activity through the anal region. While this could represent physiologic activity within stool, recommend direct visual/digital inspection.  She presents today for image guided biopsy of right ilium sclerotic lesion for further evaluation.  Past Medical History:  Diagnosis Date   Anxiety    Arthritis    Breast cancer ([place]) 2018   Cancer ([place])    breast cancer   Concussion    hit by a softball on left side of head   Hyperlipidemia    Osteoporosis    Pericarditis 1961   PONV (postoperative nausea and vomiting)    Thyroiditis 1971    Past Surgical History:  Procedure Laterality Date   ABDOMINAL HYSTERECTOMY  1985   ABDOMINAL SURGERY     APPENDECTOMY     BOWEL RESECTION  10/31/2011   Procedure: SMALL BOWEL RESECTION;  Surgeon: Donato Heinz, MD;  Location: [facility];  Service: General;;   BREAST LUMPECTOMY Right    BREAST  LUMPECTOMY WITH RADIOACTIVE SEED LOCALIZATION Right 02/07/2017   Procedure: BREAST LUMPECTOMY WITH RADIOACTIVE SEED LOCALIZATION;  Surgeon: Rolm Bookbinder, MD;  Location: [facility];  Service: General;  Laterality: Right;   BREAST LUMPECTOMY WITH RADIOACTIVE SEED LOCALIZATION Right 02/27/2018   Procedure: RIGHT BREAST LUMPECTOMY WITH RADIOACTIVE SEED LOCALIZATION;  Surgeon: Rolm Bookbinder, MD;  Location: [facility];  Service: General;  Laterality: Right;   FISSURECTOMY     LAPAROSCOPIC DISTAL PANCREATECTOMY  2009   and splenectomy   LAPAROTOMY  10/31/2011   Procedure: EXPLORATORY LAPAROTOMY;  Surgeon: Donato Heinz, MD;  Location: [facility];  Service: General;  Laterality: N/A;   MASTECTOMY Right 2021   NASAL SEPTUM SURGERY  1990   NASAL SINUS SURGERY     SIMPLE MASTECTOMY WITH AXILLARY SENTINEL NODE BIOPSY Right 06/11/2019   Procedure: RIGHT MASTECTOMY;  Surgeon: Rolm Bookbinder, MD;  Location: [facility];  Service: General;  Laterality: Right;   TUBAL LIGATION      Allergies: Macrodantin [nitrofurantoin macrocrystal], Nystatin, Promethazine, Sulfa antibiotics, Suprax [cefixime], and Penicillins  Medications: Prior to Admission medications   Medication Sig Start Date End Date Taking? Authorizing Provider  LORazepam (ATIVAN) 1 MG tablet Take 1 mg by mouth 3 (three) times daily.    Yes [provider]  polyethylene glycol (MIRALAX / GLYCOLAX) packet Take 17 g by mouth daily.   Yes [provider]  acetaminophen (TYLENOL) 500 MG tablet Take 500 mg by mouth every 6 (six) hours as needed for moderate pain or headache.    [provider]     Family History  Problem Relation Age of Onset   Diabetes Brother    Hypertension Son    Ulcers Sister     Social History   Socioeconomic History   Marital status: Widowed    Spouse name: Not on file   Number of children: Not on file   Years of education: Not on file   Highest education level: Not on file  Occupational  History   Not on file  Tobacco Use   Smoking status: Never   Smokeless tobacco: Never  Vaping Use   Vaping Use: Never used  Substance and Sexual Activity   Alcohol use: No   Drug use: No   Sexual activity: Never  Other Topics Concern   Not on file  Social History Narrative   Not on file   Social Determinants of Health   Financial Resource Strain: Not on file  Food Insecurity: Not on file  Transportation Needs: Not on file  Physical Activity: Not on file  Stress: Not on file  Social Connections: Not on file      Review of Systems denies fever,HA,CP,dyspnea, cough, abd/back pain,N/V or bleeding; she is HOH  Vital Signs: BP 124/71   Pulse 61   Temp 98.1 F (36.7 C) (Oral)   Ht 5\' 6"  (1.676 m)   Wt 115 lb (52.2 kg)   SpO2 97%   BMI 18.56 kg/m   Physical Exam awake/alert; chest- CTA bilat; heart- RRR; abd- soft,+BS,NT; no LE edema  Imaging: NM PET Image Restag (PS) Skull Base To Thigh  Result Date: 12/11/2020 CLINICAL DATA:  Initial treatment strategy for breast cancer. RIGHT neck lymph node. EXAM: NUCLEAR MEDICINE PET SKULL BASE TO THIGH TECHNIQUE: 6.2 mCi F-18 FDG was injected intravenously. Full-ring PET imaging was performed from the skull base to thigh after the radiotracer. CT data was obtained and used for attenuation correction and anatomic localization. Fasting blood glucose: 105 mg/dl COMPARISON:  None. FINDINGS: Mediastinal blood pool activity: SUV max 2.6 Liver activity: SUV max NA NECK: No hypermetabolic lymph nodes in the LEFT or RIGHT neck. Incidental CT findings: none CHEST: No hypermetabolic mediastinal or hilar nodes. No suspicious pulmonary nodules on the CT scan. Minimal metabolic activity associated thickened tissue in the RIGHT axilla with SUV max equal 2.3. This in the region surgical clips. Incidental CT findings: RIGHT mastectomy anatomy. ABDOMEN/PELVIS: No abnormal hypermetabolic activity within the liver, pancreas, adrenal glands, or spleen. No  hypermetabolic lymph nodes in the abdomen or pelvis. Intense metabolic activity through the anal region with SUV max equal 13.5. This is a spherical region of radiotracer activity which is significantly greater than any other bowel activity. No discrete lesion on the CT portion of the exam. Incidental CT findings: Laxity of the ventral abdominal wall. No frank herniation. Post small bowel surgery. Post hysterectomy. SKELETON: There is a focus of metabolic activity (SUV max equal 4.6) in the anteromedial aspect of the RIGHT fourth rib (image 151). No clear CT lesion. Mild metabolic activity in the sclerotic lesion in the T9 vertebral body SUV max equal 3.1 (image 99, 7 mm). Mild metabolic activity associated with sclerotic lesion in the medial RIGHT iliac mass measuring 9 mm with SUV max equal 2.7 (image 172). Incidental CT findings: none IMPRESSION: 1. No hypermetabolic cervical lymph nodes. 2. Mild metabolic activity associated thickened tissue in the RIGHT axilla is favored postsurgical inflammation following RIGHT axillary node dissection. 3. Several foci of activity in the skeleton skeleton. Most intense hypermetabolic activity is in anterior medial RIGHT fourth rib.  More subtle lesions are present in the T9 vertebral body and RIGHT iliac bone. Low activity does not not favor metastatic breast cancer however estrogen positive breast cancer can have variable activity on FDG PET scan. (F 18 estradiol PET/CT scan may be of benefit). 4. Intense hypermetabolic activity through the anal region. While this could represent physiologic activity within stool, recommend direct visual/digital inspection. Electronically Signed   By: Suzy Bouchard M.D.   On: 12/11/2020 16:51    Labs:  CBC: Recent Labs    02/04/20 1257 08/04/20 1321 11/17/20 1313 12/10/20 1248  WBC 8.0 9.2 8.5 7.6  HGB 13.7 14.4 14.3 15.2*  HCT 41.8 43.8 41.9 46.8*  PLT 330 307 308 297    COAGS: No results for input(s): INR, APTT in the  last 8760 hours.  BMP: Recent Labs    02/04/20 1257 08/04/20 1321 11/17/20 1313 12/10/20 1248  NA 137 138 140 138  K 4.8 4.3 4.6 4.1  CL 102 105 105 104  CO2 27 27 28 25   GLUCOSE 182* 76 81 79  BUN 11 10 10 10   CALCIUM 9.2 8.9 9.1 9.2  CREATININE 0.78 0.71 0.73 0.75  GFRNONAA >60 >60 >60 >60    LIVER FUNCTION TESTS: Recent Labs    02/04/20 1257 08/04/20 1321 11/17/20 1313 12/10/20 1248  BILITOT 0.8 0.6 0.7 1.1  AST 22 23 21 25   ALT 19 18 15 17   ALKPHOS 60 66 71 73  PROT 6.9 6.8 6.9 7.6  ALBUMIN 3.9 3.9 3.9 4.5    TUMOR MARKERS: No results for input(s): AFPTM, CEA, CA199, CHROMGRNA in the last 8760 hours.  Assessment and Plan: 85 y.o. female with PMH anxiety, HLD, osteoporosis, arthritis and right breast cancer in 2019, s/p lumpectomy followed by mastectomy. PET scan on 12/10/20 revealed :  1. No hypermetabolic cervical lymph nodes. 2. Mild metabolic activity associated thickened tissue in the RIGHT axilla is favored postsurgical inflammation following RIGHT axillary node dissection. 3. Several foci of activity in the skeleton skeleton. Most intense hypermetabolic activity is in anterior medial RIGHT fourth rib. More subtle lesions are present in the T9 vertebral body and RIGHT iliac bone. Low activity does not not favor metastatic breast cancer however estrogen positive breast cancer can have variable activity on FDG PET scan. (F 18 estradiol PET/CT scan may be of benefit). 4. Intense hypermetabolic activity through the anal region. While this could represent physiologic activity within stool, recommend direct visual/digital inspection.  She presents today for image guided biopsy of right ilium sclerotic lesion for further evaluation. Risks and benefits of procedure was discussed with the patient and/or patient's family including, but not limited to bleeding, infection, damage to adjacent structures or low yield requiring additional tests.  All of the questions  were answered and there is agreement to proceed.  Consent signed and in chart.    Thank you for this interesting consult.  I greatly enjoyed meeting Caitlin Alexander and look forward to participating in their care.  A copy of this report was sent to the requesting provider on this date.  Electronically Signed: D. Rowe Robert, PA-C 12/23/2020, 9:52 AM   I spent a total of 25 minutes    in face to face in clinical consultation, greater than 50% of which was counseling/coordinating care for image guided biopsy of right ilium sclerotic lesion

## 2020-12-23 NOTE — Procedures (Signed)
Interventional Radiology Procedure:   Indications: History of breast cancer with suspicious sclerotic lesion in right ilium  Procedure: CT guided biopsy of right iliac bone lesion  Findings: Sclerotic lesion in right ilium.  2 cores obtained.  Complications: No immediate complications noted.     EBL: Minimal  Plan: Bedrest 1 hour, then discharge to home.    Caitlin Alexander R. Anselm Pancoast, MD  Pager: [phone]

## 2020-12-30 NOTE — Progress Notes (Signed)
[facility] [address] [place] [address]   Patient Care Team: Asencion Noble, MD as PCP - General (Internal Medicine)  SUMMARY OF ONCOLOGIC HISTORY: Oncology History  Breast cancer of upper-outer quadrant of right female breast ([place])  04/02/2018 Initial Diagnosis   Breast cancer of upper-outer quadrant of right female breast ([place])   08/14/2019 Cancer Staging   Staging form: Breast, AJCC 8th Edition - Clinical stage from 08/14/2019: Stage IIA (rcT1b, cN1, cM0, G1, ER+, PR-, HER2-) - Signed by Derek Jack, MD on 08/14/2019     CHIEF COMPLIANT: Follow-up of breast cancer   INTERVAL HISTORY: [name] is a 85 y.o. female here today for follow up of her breast cancer. Her last visit was on 12/15/2020.   Today she reports feeling good. She denies any current pains and her activity levels are high. She is able to do all of her typical home activities. Her appetite is good.   REVIEW OF SYSTEMS:   Review of Systems  Constitutional:  Negative for appetite change and fatigue.  Cardiovascular:  Positive for palpitations.  Gastrointestinal:  Positive for constipation.  Musculoskeletal:  Negative for arthralgias and myalgias.  All other systems reviewed and are negative.  I have reviewed the past medical history, past surgical history, social history and family history with the patient and they are unchanged from previous note.   ALLERGIES:   is allergic to macrodantin [nitrofurantoin macrocrystal], nystatin, promethazine, sulfa antibiotics, suprax [cefixime], and penicillins.   MEDICATIONS:  Current Outpatient Medications  Medication Sig Dispense Refill   acetaminophen (TYLENOL) 500 MG tablet Take 500 mg by mouth every 6 (six) hours as needed for moderate pain or headache.     LORazepam (ATIVAN) 1 MG tablet Take 1 mg by mouth 3 (three) times daily.      polyethylene glycol (MIRALAX / GLYCOLAX) packet Take 17 g by mouth daily.     No current  facility-administered medications for this visit.     PHYSICAL EXAMINATION: Performance status (ECOG): 1 - Symptomatic but completely ambulatory  Vitals:   12/31/20 0952  BP: 123/67  Pulse: 68  Resp: 20  Temp: 98 F (36.7 C)  SpO2: 98%   Wt Readings from Last 3 Encounters:  12/31/20 118 lb 6.4 oz (53.7 kg)  12/23/20 115 lb (52.2 kg)  12/15/20 119 lb 3.2 oz (54.1 kg)   Physical Exam Vitals reviewed.  Constitutional:      Appearance: Normal appearance.  Cardiovascular:     Rate and Rhythm: Normal rate and regular rhythm.     Pulses: Normal pulses.     Heart sounds: Normal heart sounds.  Pulmonary:     Effort: Pulmonary effort is normal.     Breath sounds: Normal breath sounds.  Neurological:     General: No focal deficit present.     Mental Status: She is alert and oriented to person, place, and time.  Psychiatric:        Mood and Affect: Mood normal.        Behavior: Behavior normal.    Breast Exam Chaperone: Thana Ates     LABORATORY DATA:  I have reviewed the data as listed CMP Latest Ref Rng & Units 12/23/2020 12/10/2020 11/17/2020  Glucose 70 - 99 mg/dL 93 79 81  BUN 8 - 23 mg/dL _0 Creatinine 0.44 - 1.00 mg/dL 0.76 0.75 0.73  Sodium 135 - 145 mmol/L 137 138 140  Potassium 3.5 - 5.1 mmol/L 4.3  4.1 4.6  Chloride 98 - 111 mmol/L 104 104 105  CO2 22 - 32 mmol/L _0 Calcium 8.9 - 10.3 mg/dL 9.1 9.2 9.1  Total Protein 6.5 - 8.1 g/dL - 7.6 6.9  Total Bilirubin 0.3 - 1.2 mg/dL - 1.1 0.7  Alkaline Phos 38 - 126 U/L - 73 71  AST 15 - 41 U/L - 25 21  ALT 0 - 44 U/L - 17 15   Lab Results  Component Value Date   CAN153 250.0 (H) 12/10/2020   CAN153 181.0 (H) 11/17/2020   CAN153 78.3 (H) 08/04/2020   Lab Results  Component Value Date   WBC 6.8 12/23/2020   HGB 13.8 12/23/2020   HCT 41.2 12/23/2020   MCV 94.7 12/23/2020   PLT 315 12/23/2020   NEUTROABS 2.7 12/23/2020    ASSESSMENT:  1.  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  Declined tamoxifen. -Right mastectomy on 06/11/2019 for recurrence.  0.6 cm IDC, grade 1, 1/2 lymph nodes positive with extracapsular extension.  ER was 100%.  PR 0%.  HER-2 was negative.  Ki-67 2%.  PT1BPN1A. -She was not interested in pursuing radiation therapy. -She started tamoxifen on 08/14/2019. -She stopped tamoxifen in the middle of June 2021 due to when she sat still she could feel her heartbeat. She had multiple EKGs at her primary care doctor's office and they were all normal. She does not want to go back on any antiestrogen pills. - PET scan on 3/54/5625 with metabolic activity in the right fourth rib, sclerotic lesion in T9 vertebral body and right iliac mass. - Right iliac bone biopsy on 12/15/2020 consistent with metastatic carcinoma, ER/PR 90% positive, Ki-67 15%, HER2 2+ by IHC and negative by FISH.     2.  Osteoporosis: -Bone density on 09/21/2017 showed T score of -3.2 in the right femoral neck. -Prolia was recommended in the past which was declined.   PLAN:  1.  Metastatic ER/PR positive breast cancer to the bones: - We have reviewed PET scan findings.  Her daughter-in-law is accompanying her today. - Last CEA 15-3 was 250 on 12/10/2020. - We reviewed pathology report from 12/23/2020 of the right iliac bone biopsy. - We have discussed normal prognosis of metastatic breast cancer to the bones and treatment intent in the palliative setting to improve symptoms and prolong survival. - She had difficulty taking tamoxifen in the adjuvant setting.  She did not have any side effects but was worried about potential side effects. - I have recommended starting her on single agent anastrozole at this time.  We discussed side effects in detail.  If she tolerates anastrozole very well, will consider adding CDK 4/6 inhibitor. - We will follow-up in 4 weeks  with labs and tumor marker.   2.  Osteoporosis: - She has declined Prolia in the past. - I have talked to her about initiating her on Xgeva to decrease skeletal related events.  We will talk about it again at next visit. - She was instructed to take calcium and vitamin D supplements daily.   Orders placed this encounter:  No orders of the defined types were placed in this encounter. Total time spent is 40 minutes with more than 50% of the time spent face-to-face discussing new diagnosis, prognosis, treatment plan, counseling and coordination of care.  The patient has a good understanding of the  overall plan. She agrees with it. She will call with any problems that may develop before the next visit here.  Derek Jack, MD [place] [phone]   I, Thana Ates, am acting as a scribe for Dr. Derek Jack.  I, Derek Jack MD, have reviewed the above documentation for accuracy and completeness, and I agree with the above.

## 2020-12-31 ENCOUNTER — Other Ambulatory Visit (HOSPITAL_COMMUNITY): Payer: Self-pay | Admitting: Hematology

## 2020-12-31 ENCOUNTER — Other Ambulatory Visit: Payer: Self-pay

## 2020-12-31 ENCOUNTER — Inpatient Hospital Stay (HOSPITAL_COMMUNITY): Payer: Medicare Other | Attending: Hematology | Admitting: Hematology

## 2020-12-31 VITALS — BP 123/67 | HR 68 | Temp 98.0°F | Resp 20 | Wt 118.4 lb

## 2020-12-31 DIAGNOSIS — Z170 Estrogen receptor positive status [ER+]: Secondary | ICD-10-CM | POA: Diagnosis not present

## 2020-12-31 DIAGNOSIS — M810 Age-related osteoporosis without current pathological fracture: Secondary | ICD-10-CM | POA: Insufficient documentation

## 2020-12-31 DIAGNOSIS — Z79811 Long term (current) use of aromatase inhibitors: Secondary | ICD-10-CM | POA: Insufficient documentation

## 2020-12-31 DIAGNOSIS — E559 Vitamin D deficiency, unspecified: Secondary | ICD-10-CM | POA: Diagnosis not present

## 2020-12-31 DIAGNOSIS — C7951 Secondary malignant neoplasm of bone: Secondary | ICD-10-CM | POA: Diagnosis not present

## 2020-12-31 DIAGNOSIS — C50411 Malignant neoplasm of upper-outer quadrant of right female breast: Secondary | ICD-10-CM

## 2020-12-31 DIAGNOSIS — Z9011 Acquired absence of right breast and nipple: Secondary | ICD-10-CM | POA: Insufficient documentation

## 2020-12-31 LAB — SURGICAL PATHOLOGY

## 2020-12-31 MED ORDER — ANASTROZOLE 1 MG PO TABS: 1.0000 mg | ORAL_TABLET | Freq: Every day | ORAL | 1 refills | Status: DC

## 2020-12-31 NOTE — Patient Instructions (Addendum)
[facility] at [facility] Discharge Instructions  You were seen and examined today by Dr. Delton Coombes. Dr. Delton Coombes reviewed your recent biopsy results.  The recent biopsy revealed that the breast cancer has spread to your bones. It is now considered Stage IV and cannot be cured but it can be controlled with pills. Dr. Delton Coombes has prescribed Anastrazole. This is to be taken once a day. It is an anti-estrogen pill and works by preventing estrogen production in your body. This helps the cancer because the cancer is being fed by estrogen.  Common side effects of anastrazole include body aches and hot flashes. These subside after time.  Follow-up with Dr. Delton Coombes in 1 month.   Thank you for choosing [place] at [facility] to provide your oncology and hematology care.  To afford each patient quality time with our provider, please arrive at least 15 minutes before your scheduled appointment time.   If you have a lab appointment with the [facility] please come in thru the Main Entrance and check in at the main information desk.  You need to re-schedule your appointment should you arrive 10 or more minutes late.  We strive to give you quality time with our providers, and arriving late affects you and other patients whose appointments are after yours.  Also, if you no show three or more times for appointments you may be dismissed from the clinic at the providers discretion.     Again, thank you for choosing [facility].  Our hope is that these requests will decrease the amount of time that you wait before being seen by our physicians.       _____________________________________________________________  Should you have questions after your visit to [facility], please contact our office at [phone] and follow the prompts.  Our office hours are 8:00 a.m. and 4:30 p.m. Monday - Friday.  Please note that voicemails left  after 4:00 p.m. may not be returned until the following business day.  We are closed weekends and major holidays.  You do have access to a nurse 24-7, just call the main number to the clinic [phone] and do not press any options, hold on the line and a nurse will answer the phone.    For prescription refill requests, have your pharmacy contact our office and allow 72 hours.    Due to Covid, you will need to wear a mask upon entering the hospital. If you do not have a mask, a mask will be given to you at the Main Entrance upon arrival. For doctor visits, patients may have 1 support person age 54 or older with them. For treatment visits, patients can not have anyone with them due to social distancing guidelines and our immunocompromised population.

## 2021-01-26 ENCOUNTER — Encounter (HOSPITAL_COMMUNITY): Payer: Self-pay

## 2021-02-02 NOTE — Progress Notes (Signed)
[facility] [address], [place] [address]   Patient Care Team: Asencion Noble, MD as PCP - General (Internal Medicine)  SUMMARY OF ONCOLOGIC HISTORY: Oncology History  Breast cancer of upper-outer quadrant of right female breast ([place])  04/02/2018 Initial Diagnosis   Breast cancer of upper-outer quadrant of right female breast ([place])   08/14/2019 Cancer Staging   Staging form: Breast, AJCC 8th Edition - Clinical stage from 08/14/2019: Stage IIA (rcT1b, cN1, cM0, G1, ER+, PR-, HER2-) - Signed by Derek Jack, MD on 08/14/2019      CHIEF COMPLIANT: Follow-up of breast cancer   INTERVAL HISTORY: Caitlin Alexander is a 85 y.o. female here today for follow up of her breast cancer. Her last visit was on 12/31/2020.   Today she reports feeling fair. She stopped taking anastrozole after 12 days as she was anxious after reading the list of side-effects provided by the pharmacy, and she believed the medication was causing pain in the back of her head as well as pain in her back from her neck to her pelvis for which she has taken one 500 mg tablet today. It has been 22 days since she stopped anastrozole and the headache and back pain are still present. She also reports redness on her forearms and tinnitus which are not currently present. She also reports pain in her left knee starting this morning. She does not want to take vitamin D or calcium as she reports they caused nausea. She denies hot flashes.    REVIEW OF SYSTEMS:   Review of Systems  Constitutional:  Negative for appetite change (70%) and fatigue (60%).  Endocrine: Negative for hot flashes.  Musculoskeletal:  Positive for arthralgias (L knee) and back pain (5/10).  Neurological:  Positive for headaches.  All other systems reviewed and are negative.  I have reviewed the past medical history, past surgical history, social history and family history with the patient and they are unchanged from previous  note.   ALLERGIES:   is allergic to macrodantin [nitrofurantoin macrocrystal], nystatin, promethazine, sulfa antibiotics, suprax [cefixime], and penicillins.   MEDICATIONS:  Current Outpatient Medications  Medication Sig Dispense Refill   anastrozole (ARIMIDEX) 1 MG tablet TAKE 1 TABLET(1 MG) BY MOUTH DAILY 90 tablet 1   LORazepam (ATIVAN) 1 MG tablet Take 1 mg by mouth 3 (three) times daily.      polyethylene glycol (MIRALAX / GLYCOLAX) packet Take 17 g by mouth daily.     acetaminophen (TYLENOL) 500 MG tablet Take 500 mg by mouth every 6 (six) hours as needed for moderate pain or headache. (Patient not taking: Reported on 02/03/2021)     No current facility-administered medications for this visit.     PHYSICAL EXAMINATION: Performance status (ECOG): 1 - Symptomatic but completely ambulatory  Vitals:   02/03/21 1354  BP: 138/72  Pulse: 60  Resp: 18  Temp: 98.1 F (36.7 C)  SpO2: 96%   Wt Readings from Last 3 Encounters:  02/03/21 117 lb 6.4 oz (53.3 kg)  12/31/20 118 lb 6.4 oz (53.7 kg)  12/23/20 115 lb (52.2 kg)   Physical Exam Vitals reviewed.  Constitutional:      Appearance: Normal appearance.  Cardiovascular:     Rate and Rhythm: Normal rate and regular rhythm.     Pulses: Normal pulses.     Heart sounds: Normal heart sounds.  Pulmonary:     Effort: Pulmonary effort is normal.     Breath sounds: Normal breath  sounds.  Neurological:     General: No focal deficit present.     Mental Status: She is alert and oriented to person, place, and time.  Psychiatric:        Mood and Affect: Mood normal.        Behavior: Behavior normal.    Breast Exam Chaperone: Caitlin Alexander     LABORATORY DATA:  I have reviewed the data as listed CMP Latest Ref Rng & Units 02/03/2021 12/23/2020 12/10/2020  Glucose 70 - 99 mg/dL 92 93 79  BUN 8 - 23 mg/dL _0 Creatinine 0.44 - 1.00 mg/dL 0.76 0.76 0.75  Sodium 135 - 145 mmol/L 138 137 138  Potassium 3.5 - 5.1 mmol/L 4.3  4.3 4.1  Chloride 98 - 111 mmol/L 101 104 104  CO2 22 - 32 mmol/L _1 Calcium 8.9 - 10.3 mg/dL 9.1 9.1 9.2  Total Protein 6.5 - 8.1 g/dL 6.8 - 7.6  Total Bilirubin 0.3 - 1.2 mg/dL 0.8 - 1.1  Alkaline Phos 38 - 126 U/L 72 - 73  AST 15 - 41 U/L 26 - 25  ALT 0 - 44 U/L 19 - 17   Lab Results  Component Value Date   CAN153 250.0 (H) 12/10/2020   CAN153 181.0 (H) 11/17/2020   CAN153 78.3 (H) 08/04/2020   Lab Results  Component Value Date   WBC 8.4 02/03/2021   HGB 13.8 02/03/2021   HCT 40.8 02/03/2021   MCV 94.7 02/03/2021   PLT 306 02/03/2021   NEUTROABS 4.2 02/03/2021    ASSESSMENT:  1.  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  Declined tamoxifen. -Right mastectomy on 06/11/2019 for recurrence.  0.6 cm IDC, grade 1, 1/2 lymph nodes positive with extracapsular extension.  ER was 100%.  PR 0%.  HER-2 was negative.  Ki-67 2%.  PT1BPN1A. -She was not interested in pursuing radiation therapy. -She started tamoxifen on 08/14/2019. -She stopped tamoxifen in the middle of June 2021 due to when she sat still she could feel her heartbeat. She had multiple EKGs at her primary care doctor's office and they were all normal. She does not want to go back on any antiestrogen pills. - PET scan on 7/74/1287 with metabolic activity in the right fourth rib, sclerotic lesion in T9 vertebral body and right iliac mass. - Right iliac bone biopsy on 12/15/2020 consistent with metastatic carcinoma, ER/PR 90% positive, Ki-67 15%, HER2 2+ by IHC and negative by FISH.     2.  Osteoporosis: -Bone density on 09/21/2017 showed T score of -3.2 in the right femoral neck. -Prolia was recommended in the past which was declined.    PLAN:  1.  Metastatic ER/PR positive breast cancer to the bones: - We have started her on anastrozole at last visit in the palliative setting. -  She take anastrozole for 12 days.  She reported having back pain extending from lower back to the neck region and lower part of the head.  She also reported transient erythematous rash on the upper extremities even now 3 weeks after stopping the anastrozole. - According to daughter-in-law, she apparently read the side effects printout given by the pharmacy and started complaining about having most of them. - She is not willing to go back on anastrozole.  She could not tolerate tamoxifen in the past in the adjuvant setting. - We  have talked about alternative options.  I have spent at least 20 minutes trying to convince her to restart on some form of antiestrogen therapy.  I do not believe the back pain she is having is coming from anastrozole which was stopped slightly more than 3 weeks ago. - We talked about Aromasin 25 mg daily.  We talked about side effects.  As she has severe sensitivities, I have recommended starting it every other day. - We will reevaluate her in 6 weeks with repeat labs.   2.  Osteoporosis/bone metastasis: - She has declined bisphosphonate therapy in the past. - Continue calcium and vitamin D supplements.  Breast Cancer therapy associated bone loss: I have recommended calcium, Vitamin D and weight bearing exercises.  Orders placed this encounter:  No orders of the defined types were placed in this encounter.   The patient has a good understanding of the overall plan. She agrees with it. She will call with any problems that may develop before the next visit here.  Derek Jack, MD [place] [phone]   I, Caitlin Alexander, am acting as a scribe for Dr. Derek Jack.  I, Derek Jack MD, have reviewed the above documentation for accuracy and completeness, and I agree with the above.

## 2021-02-03 ENCOUNTER — Other Ambulatory Visit (HOSPITAL_COMMUNITY): Payer: Self-pay | Admitting: Hematology

## 2021-02-03 ENCOUNTER — Other Ambulatory Visit: Payer: Self-pay

## 2021-02-03 ENCOUNTER — Inpatient Hospital Stay (HOSPITAL_COMMUNITY): Payer: Medicare Other

## 2021-02-03 ENCOUNTER — Inpatient Hospital Stay (HOSPITAL_COMMUNITY): Payer: Medicare Other | Attending: Hematology | Admitting: Hematology

## 2021-02-03 VITALS — BP 138/72 | HR 60 | Temp 98.1°F | Resp 18 | Wt 117.4 lb

## 2021-02-03 DIAGNOSIS — Z79899 Other long term (current) drug therapy: Secondary | ICD-10-CM | POA: Insufficient documentation

## 2021-02-03 DIAGNOSIS — M810 Age-related osteoporosis without current pathological fracture: Secondary | ICD-10-CM | POA: Diagnosis not present

## 2021-02-03 DIAGNOSIS — Z170 Estrogen receptor positive status [ER+]: Secondary | ICD-10-CM | POA: Diagnosis not present

## 2021-02-03 DIAGNOSIS — C50411 Malignant neoplasm of upper-outer quadrant of right female breast: Secondary | ICD-10-CM | POA: Diagnosis present

## 2021-02-03 DIAGNOSIS — Z9011 Acquired absence of right breast and nipple: Secondary | ICD-10-CM | POA: Insufficient documentation

## 2021-02-03 DIAGNOSIS — R51 Headache with orthostatic component, not elsewhere classified: Secondary | ICD-10-CM | POA: Insufficient documentation

## 2021-02-03 DIAGNOSIS — E559 Vitamin D deficiency, unspecified: Secondary | ICD-10-CM

## 2021-02-03 LAB — COMPREHENSIVE METABOLIC PANEL
ALT: 19 U/L (ref 0–44)
AST: 26 U/L (ref 15–41)
Albumin: 4 g/dL (ref 3.5–5.0)
Alkaline Phosphatase: 72 U/L (ref 38–126)
Anion gap: 10 (ref 5–15)
BUN: 10 mg/dL (ref 8–23)
CO2: 27 mmol/L (ref 22–32)
Calcium: 9.1 mg/dL (ref 8.9–10.3)
Chloride: 101 mmol/L (ref 98–111)
Creatinine, Ser: 0.76 mg/dL (ref 0.44–1.00)
GFR, Estimated: >60 mL/min (ref >60)
Glucose, Bld: 92 mg/dL (ref 70–99)
Potassium: 4.3 mmol/L (ref 3.5–5.1)
Sodium: 138 mmol/L (ref 135–145)
Total Bilirubin: 0.8 mg/dL (ref 0.3–1.2)
Total Protein: 6.8 g/dL (ref 6.5–8.1)

## 2021-02-03 LAB — VITAMIN D 25 HYDROXY (VIT D DEFICIENCY, FRACTURES): Vit D, 25-Hydroxy: 23.16 ng/mL — ABNORMAL LOW (ref 30–100)

## 2021-02-03 LAB — CBC WITH DIFFERENTIAL/PLATELET
Abs Immature Granulocytes: 0.02 10*3/uL (ref 0.00–0.07)
Basophils Absolute: 0.1 10*3/uL (ref 0.0–0.1)
Basophils Relative: 1 %
Eosinophils Absolute: 0.1 10*3/uL (ref 0.0–0.5)
Eosinophils Relative: 1 %
HCT: 40.8 % (ref 36.0–46.0)
Hemoglobin: 13.8 g/dL (ref 12.0–15.0)
Immature Granulocytes: 0 %
Lymphocytes Relative: 34 %
Lymphs Abs: 2.8 10*3/uL (ref 0.7–4.0)
MCH: 32 pg (ref 26.0–34.0)
MCHC: 33.8 g/dL (ref 30.0–36.0)
MCV: 94.7 fL (ref 80.0–100.0)
Monocytes Absolute: 1.2 10*3/uL — ABNORMAL HIGH (ref 0.1–1.0)
Monocytes Relative: 14 %
Neutro Abs: 4.2 10*3/uL (ref 1.7–7.7)
Neutrophils Relative %: 50 %
Platelets: 306 10*3/uL (ref 150–400)
RBC: 4.31 MIL/uL (ref 3.87–5.11)
RDW: 13.3 % (ref 11.5–15.5)
WBC: 8.4 10*3/uL (ref 4.0–10.5)
nRBC: 0 % (ref 0.0–0.2)

## 2021-02-03 MED ORDER — EXEMESTANE 25 MG PO TABS: 25.0000 mg | ORAL_TABLET | Freq: Every day | ORAL | 1 refills | Status: DC

## 2021-02-03 NOTE — Progress Notes (Signed)
Exemestane sent in per Dr. Tomie China order.

## 2021-02-03 NOTE — Patient Instructions (Addendum)
[place] at [facility] Discharge Instructions  You were seen and examined today by Dr. Delton Coombes. He reviewed your most recent labs and everything looks good. He wants you to start taking the exemestane every other day, the bottle will say to take it everyday. Please follow up as scheduled.   Thank you for choosing [place] at [facility] to provide your oncology and hematology care.  To afford each patient quality time with our provider, please arrive at least 15 minutes before your scheduled appointment time.   If you have a lab appointment with the [facility] please come in thru the Main Entrance and check in at the main information desk.  You need to re-schedule your appointment should you arrive 10 or more minutes late.  We strive to give you quality time with our providers, and arriving late affects you and other patients whose appointments are after yours.  Also, if you no show three or more times for appointments you may be dismissed from the clinic at the providers discretion.     Again, thank you for choosing [facility].  Our hope is that these requests will decrease the amount of time that you wait before being seen by our physicians.       _____________________________________________________________  Should you have questions after your visit to [facility], please contact our office at [phone] and follow the prompts.  Our office hours are 8:00 a.m. and 4:30 p.m. Monday - Friday.  Please note that voicemails left after 4:00 p.m. may not be returned until the following business day.  We are closed weekends and major holidays.  You do have access to a nurse 24-7, just call the main number to the clinic [phone] and do not press any options, hold on the line and a nurse will answer the phone.    For prescription refill requests, have your pharmacy contact our office and allow 72 hours.    Due to  Covid, you will need to wear a mask upon entering the hospital. If you do not have a mask, a mask will be given to you at the Main Entrance upon arrival. For doctor visits, patients may have 1 support person age 56 or older with them. For treatment visits, patients can not have anyone with them due to social distancing guidelines and our immunocompromised population.

## 2021-02-04 ENCOUNTER — Other Ambulatory Visit (HOSPITAL_COMMUNITY): Payer: Self-pay

## 2021-02-04 DIAGNOSIS — Z170 Estrogen receptor positive status [ER+]: Secondary | ICD-10-CM

## 2021-02-04 LAB — CANCER ANTIGEN 15-3: CA 15-3: 244 U/mL — ABNORMAL HIGH (ref 0.0–25.0)

## 2021-02-04 LAB — CANCER ANTIGEN 27.29: CA 27.29: 202.2 U/mL — ABNORMAL HIGH (ref 0.0–38.6)

## 2021-02-04 NOTE — Progress Notes (Signed)
Labs ordered per Dr. Tomie China request.

## 2021-03-17 NOTE — Progress Notes (Signed)
Caitlin Alexander [address] [place] [address]   Patient Care Team: Asencion Noble, MD as PCP - General (Internal Medicine)  SUMMARY OF ONCOLOGIC HISTORY: Oncology History  Breast cancer of upper-outer quadrant of right female breast ([place])  04/02/2018 Initial Diagnosis   Breast cancer of upper-outer quadrant of right female breast ([place])   08/14/2019 Cancer Staging   Staging form: Breast, AJCC 8th Edition - Clinical stage from 08/14/2019: Stage IIA (rcT1b, cN1, cM0, G1, ER+, PR-, HER2-) - Signed by Derek Jack, MD on 08/14/2019      CHIEF COMPLIANT: Follow-up of breast cancer   INTERVAL HISTORY: [name] is a 85 y.o. female here today for follow up of her breast cancer. Her last visit was on 02/03/2021.   Today she reports feeling good, and she is accompanied by her daughter-in-law. She has not taken anastrozole after reading a list of side effects; her daughter-in-law reports her friend and her preacher told her not to take the medication. She reports back and shoulder pain. She is not taking calcium or vitamin D. She reports her red occasional turns red and peels. She is not taking tylenol. Her daughter-in-law reports that the only medication she takes is 3 ativan tablets a day as she believes ativan does not have side-effects. She reports occasional palpitations.   REVIEW OF SYSTEMS:   Review of Systems  Constitutional:  Negative for appetite change and fatigue (75%).  Cardiovascular:  Positive for palpitations.  Gastrointestinal:  Positive for constipation.  Musculoskeletal:  Positive for back pain and neck pain.  All other systems reviewed and are negative.  I have reviewed the past medical history, past surgical history, social history and family history with the patient and they are unchanged from previous note.   ALLERGIES:   is allergic to macrodantin [nitrofurantoin macrocrystal], nystatin, promethazine, sulfa antibiotics, suprax  [cefixime], and penicillins.   MEDICATIONS:  Current Outpatient Medications  Medication Sig Dispense Refill   acetaminophen (TYLENOL) 500 MG tablet Take 500 mg by mouth every 6 (six) hours as needed for moderate pain or headache.     exemestane (AROMASIN) 25 MG tablet TAKE 1 TABLET(25 MG) BY MOUTH DAILY AFTER BREAKFAST 90 tablet 0   LORazepam (ATIVAN) 1 MG tablet Take 1 mg by mouth 3 (three) times daily.      polyethylene glycol (MIRALAX / GLYCOLAX) packet Take 17 g by mouth daily.     No current facility-administered medications for this visit.     PHYSICAL EXAMINATION: Performance status (ECOG): 1 - Symptomatic but completely ambulatory  Vitals:   03/18/21 1422  BP: 137/64  Pulse: 65  Resp: 18  Temp: 98.1 F (36.7 C)  SpO2: 98%   Wt Readings from Last 3 Encounters:  03/18/21 118 lb 3.2 oz (53.6 kg)  02/03/21 117 lb 6.4 oz (53.3 kg)  12/31/20 118 lb 6.4 oz (53.7 kg)   Physical Exam Vitals reviewed.  Constitutional:      Appearance: Normal appearance.  Cardiovascular:     Rate and Rhythm: Normal rate and regular rhythm.     Pulses: Normal pulses.     Heart sounds: Normal heart sounds.  Pulmonary:     Effort: Pulmonary effort is normal.     Breath sounds: Normal breath sounds.  Neurological:     General: No focal deficit present.     Mental Status: She is alert and oriented to person, place, and time.  Psychiatric:        Mood  and Affect: Mood normal.        Behavior: Behavior normal.    Breast Exam Chaperone: Thana Ates     LABORATORY DATA:  I have reviewed the data as listed CMP Latest Ref Rng & Units 03/18/2021 02/03/2021 12/23/2020  Glucose 70 - 99 mg/dL 113(H) 92 93  BUN 8 - 23 mg/dL 9 10 8   Creatinine 0.44 - 1.00 mg/dL 0.71 0.76 0.76  Sodium 135 - 145 mmol/L 134(L) 138 137  Potassium 3.5 - 5.1 mmol/L 4.5 4.3 4.3  Chloride 98 - 111 mmol/L 100 101 104  CO2 22 - 32 mmol/L 26 27 26   Calcium 8.9 - 10.3 mg/dL 8.9 9.1 9.1  Total Protein 6.5 - 8.1 g/dL  6.9 6.8 -  Total Bilirubin 0.3 - 1.2 mg/dL 0.6 0.8 -  Alkaline Phos 38 - 126 U/L 79 72 -  AST 15 - 41 U/L 27 26 -  ALT 0 - 44 U/L 17 19 -   Lab Results  Component Value Date   CAN153 244.0 (H) 02/03/2021   CAN153 250.0 (H) 12/10/2020   CAN153 181.0 (H) 11/17/2020   Lab Results  Component Value Date   WBC 8.1 03/18/2021   HGB 13.5 03/18/2021   HCT 40.6 03/18/2021   MCV 96.7 03/18/2021   PLT 305 03/18/2021   NEUTROABS 3.0 03/18/2021    ASSESSMENT:  1.  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  Declined tamoxifen. -Right mastectomy on 06/11/2019 for recurrence.  0.6 cm IDC, grade 1, 1/2 lymph nodes positive with extracapsular extension.  ER was 100%.  PR 0%.  HER-2 was negative.  Ki-67 2%.  PT1BPN1A. -She was not interested in pursuing radiation therapy. -She started tamoxifen on 08/14/2019. -She stopped tamoxifen in the middle of June 2021 due to when she sat still she could feel her heartbeat. She had multiple EKGs at her primary care doctor's office and they were all normal. She does not want to go back on any antiestrogen pills. - PET scan on 1/61/0960 with metabolic activity in the right fourth rib, sclerotic lesion in T9 vertebral body and right iliac mass. - Right iliac bone biopsy on 12/15/2020 consistent with metastatic carcinoma, ER/PR 90% positive, Ki-67 15%, HER2 2+ by IHC and negative by FISH.     2.  Osteoporosis: -Bone density on 09/21/2017 showed T score of -3.2 in the right femoral neck. -Prolia was recommended in the past which was declined.   PLAN:  1.  Metastatic ER/PR positive breast cancer to the bones: - We started her on anastrozole in palliative setting.  She took anastrozole for 12 days.  She reportedly had pain in the back and neck region and discontinued it. - She reports that she still has pains in the back and thinks it  is coming from anastrozole which she discontinued few months ago. - At last visit we have talked to her about starting on exemestane.  She apparently read side effects and decided not even to fill the prescription. - I had a prolonged discussion with her about her metastatic cancer.  We have again reviewed her PET scan which showed bone mets in several areas. - I think the pain she is reporting is coming from bone mets rather than anastrozole. - She was inquiring about chemotherapy option.  I have told her that chemotherapy is not a good option and bone only metastatic  disease when she did not progress on antiestrogen therapy. - We will monitor her at this time.  She will come back in 3 months with repeat PET scan and tumor markers.   2.  Osteoporosis/bone metastasis: - She has declined bisphosphonate therapy multiple times in the past. - Continue calcium and vitamin D supplements.  Breast Cancer therapy associated bone loss: I have recommended calcium, Vitamin D and weight bearing exercises.  Orders placed this encounter:  Orders Placed This Encounter  Procedures   NM PET Image Restag (PS) Skull Base To Thigh   CBC with Differential/Platelet   Comprehensive metabolic panel   Cancer antigen 15-3   Cancer antigen 27.29    The patient has a good understanding of the overall plan. She agrees with it. She will call with any problems that may develop before the next visit here.  Derek Jack, MD [place] [phone]   I, Thana Ates, am acting as a scribe for Dr. Derek Jack.  I, Derek Jack MD, have reviewed the above documentation for accuracy and completeness, and I agree with the above.

## 2021-03-18 ENCOUNTER — Other Ambulatory Visit: Payer: Self-pay

## 2021-03-18 ENCOUNTER — Encounter (HOSPITAL_COMMUNITY): Payer: Self-pay | Admitting: Hematology

## 2021-03-18 ENCOUNTER — Inpatient Hospital Stay (HOSPITAL_COMMUNITY): Payer: Medicare Other

## 2021-03-18 ENCOUNTER — Inpatient Hospital Stay (HOSPITAL_COMMUNITY): Payer: Medicare Other | Attending: Hematology | Admitting: Hematology

## 2021-03-18 VITALS — BP 137/64 | HR 65 | Temp 98.1°F | Resp 18 | Ht 63.0 in | Wt 118.2 lb

## 2021-03-18 DIAGNOSIS — M810 Age-related osteoporosis without current pathological fracture: Secondary | ICD-10-CM | POA: Insufficient documentation

## 2021-03-18 DIAGNOSIS — M25519 Pain in unspecified shoulder: Secondary | ICD-10-CM | POA: Diagnosis not present

## 2021-03-18 DIAGNOSIS — Z170 Estrogen receptor positive status [ER+]: Secondary | ICD-10-CM

## 2021-03-18 DIAGNOSIS — C50411 Malignant neoplasm of upper-outer quadrant of right female breast: Secondary | ICD-10-CM

## 2021-03-18 DIAGNOSIS — C7951 Secondary malignant neoplasm of bone: Secondary | ICD-10-CM | POA: Insufficient documentation

## 2021-03-18 DIAGNOSIS — R002 Palpitations: Secondary | ICD-10-CM | POA: Diagnosis not present

## 2021-03-18 DIAGNOSIS — Z9011 Acquired absence of right breast and nipple: Secondary | ICD-10-CM | POA: Diagnosis not present

## 2021-03-18 DIAGNOSIS — Z79899 Other long term (current) drug therapy: Secondary | ICD-10-CM | POA: Insufficient documentation

## 2021-03-18 DIAGNOSIS — D0511 Intraductal carcinoma in situ of right breast: Secondary | ICD-10-CM | POA: Diagnosis not present

## 2021-03-18 LAB — COMPREHENSIVE METABOLIC PANEL
ALT: 17 U/L (ref 0–44)
AST: 27 U/L (ref 15–41)
Albumin: 3.9 g/dL (ref 3.5–5.0)
Alkaline Phosphatase: 79 U/L (ref 38–126)
Anion gap: 8 (ref 5–15)
BUN: 9 mg/dL (ref 8–23)
CO2: 26 mmol/L (ref 22–32)
Calcium: 8.9 mg/dL (ref 8.9–10.3)
Chloride: 100 mmol/L (ref 98–111)
Creatinine, Ser: 0.71 mg/dL (ref 0.44–1.00)
GFR, Estimated: >60 mL/min (ref >60)
Glucose, Bld: 113 mg/dL — ABNORMAL HIGH (ref 70–99)
Potassium: 4.5 mmol/L (ref 3.5–5.1)
Sodium: 134 mmol/L — ABNORMAL LOW (ref 135–145)
Total Bilirubin: 0.6 mg/dL (ref 0.3–1.2)
Total Protein: 6.9 g/dL (ref 6.5–8.1)

## 2021-03-18 LAB — CBC WITH DIFFERENTIAL/PLATELET
Abs Immature Granulocytes: 0.02 10*3/uL (ref 0.00–0.07)
Basophils Absolute: 0.1 10*3/uL (ref 0.0–0.1)
Basophils Relative: 2 %
Eosinophils Absolute: 0.2 10*3/uL (ref 0.0–0.5)
Eosinophils Relative: 3 %
HCT: 40.6 % (ref 36.0–46.0)
Hemoglobin: 13.5 g/dL (ref 12.0–15.0)
Immature Granulocytes: 0 %
Lymphocytes Relative: 43 %
Lymphs Abs: 3.5 10*3/uL (ref 0.7–4.0)
MCH: 32.1 pg (ref 26.0–34.0)
MCHC: 33.3 g/dL (ref 30.0–36.0)
MCV: 96.7 fL (ref 80.0–100.0)
Monocytes Absolute: 1.2 10*3/uL — ABNORMAL HIGH (ref 0.1–1.0)
Monocytes Relative: 15 %
Neutro Abs: 3 10*3/uL (ref 1.7–7.7)
Neutrophils Relative %: 37 %
Platelets: 305 10*3/uL (ref 150–400)
RBC: 4.2 MIL/uL (ref 3.87–5.11)
RDW: 13.4 % (ref 11.5–15.5)
WBC: 8.1 10*3/uL (ref 4.0–10.5)
nRBC: 0 % (ref 0.0–0.2)

## 2021-03-18 NOTE — Patient Instructions (Addendum)
[place] at [facility] Discharge Instructions  You were seen and examined today by Dr. Delton Coombes.  He reviewed your most recent labs and everything looks good.   Please keep follow up appointment as scheduled in 3 months.   Thank you for choosing [place] at [facility] to provide your oncology and hematology care.  To afford each patient quality time with our provider, please arrive at least 15 minutes before your scheduled appointment time.   If you have a lab appointment with the [facility] please come in thru the Main Entrance and check in at the main information desk.  You need to re-schedule your appointment should you arrive 10 or more minutes late.  We strive to give you quality time with our providers, and arriving late affects you and other patients whose appointments are after yours.  Also, if you no show three or more times for appointments you may be dismissed from the clinic at the providers discretion.     Again, thank you for choosing [facility].  Our hope is that these requests will decrease the amount of time that you wait before being seen by our physicians.       _____________________________________________________________  Should you have questions after your visit to [facility], please contact our office at [phone] and follow the prompts.  Our office hours are 8:00 a.m. and 4:30 p.m. Monday - Friday.  Please note that voicemails left after 4:00 p.m. may not be returned until the following business day.  We are closed weekends and major holidays.  You do have access to a nurse 24-7, just call the main number to the clinic [phone] and do not press any options, hold on the line and a nurse will answer the phone.    For prescription refill requests, have your pharmacy contact our office and allow 72 hours.    Due to Covid, you will need to wear a mask upon entering the hospital. If  you do not have a mask, a mask will be given to you at the Main Entrance upon arrival. For doctor visits, patients may have 1 support person age 37 or older with them. For treatment visits, patients can not have anyone with them due to social distancing guidelines and our immunocompromised population.

## 2021-06-17 ENCOUNTER — Inpatient Hospital Stay (HOSPITAL_COMMUNITY): Payer: Medicare Other | Attending: Hematology

## 2021-06-17 ENCOUNTER — Encounter (HOSPITAL_COMMUNITY): Payer: Self-pay

## 2021-06-17 DIAGNOSIS — D0511 Intraductal carcinoma in situ of right breast: Secondary | ICD-10-CM

## 2021-06-17 DIAGNOSIS — Z170 Estrogen receptor positive status [ER+]: Secondary | ICD-10-CM | POA: Insufficient documentation

## 2021-06-17 DIAGNOSIS — C50411 Malignant neoplasm of upper-outer quadrant of right female breast: Secondary | ICD-10-CM | POA: Diagnosis present

## 2021-06-17 LAB — COMPREHENSIVE METABOLIC PANEL
ALT: 20 U/L (ref 0–44)
AST: 26 U/L (ref 15–41)
Albumin: 3.9 g/dL (ref 3.5–5.0)
Alkaline Phosphatase: 91 U/L (ref 38–126)
Anion gap: 7 (ref 5–15)
BUN: 11 mg/dL (ref 8–23)
CO2: 27 mmol/L (ref 22–32)
Calcium: 8.9 mg/dL (ref 8.9–10.3)
Chloride: 106 mmol/L (ref 98–111)
Creatinine, Ser: 0.71 mg/dL (ref 0.44–1.00)
GFR, Estimated: >60 mL/min (ref >60)
Glucose, Bld: 96 mg/dL (ref 70–99)
Potassium: 4.7 mmol/L (ref 3.5–5.1)
Sodium: 140 mmol/L (ref 135–145)
Total Bilirubin: 0.5 mg/dL (ref 0.3–1.2)
Total Protein: 6.7 g/dL (ref 6.5–8.1)

## 2021-06-17 LAB — CBC WITH DIFFERENTIAL/PLATELET
Abs Immature Granulocytes: 0.02 10*3/uL (ref 0.00–0.07)
Basophils Absolute: 0.1 10*3/uL (ref 0.0–0.1)
Basophils Relative: 2 %
Eosinophils Absolute: 0.2 10*3/uL (ref 0.0–0.5)
Eosinophils Relative: 2 %
HCT: 41.2 % (ref 36.0–46.0)
Hemoglobin: 13.3 g/dL (ref 12.0–15.0)
Immature Granulocytes: 0 %
Lymphocytes Relative: 41 %
Lymphs Abs: 3.2 10*3/uL (ref 0.7–4.0)
MCH: 30.8 pg (ref 26.0–34.0)
MCHC: 32.3 g/dL (ref 30.0–36.0)
MCV: 95.4 fL (ref 80.0–100.0)
Monocytes Absolute: 1 10*3/uL (ref 0.1–1.0)
Monocytes Relative: 13 %
Neutro Abs: 3.3 10*3/uL (ref 1.7–7.7)
Neutrophils Relative %: 42 %
Platelets: 322 10*3/uL (ref 150–400)
RBC: 4.32 MIL/uL (ref 3.87–5.11)
RDW: 13.5 % (ref 11.5–15.5)
WBC: 7.9 10*3/uL (ref 4.0–10.5)
nRBC: 0 % (ref 0.0–0.2)

## 2021-06-18 LAB — CANCER ANTIGEN 27.29: CA 27.29: 1274.6 U/mL — ABNORMAL HIGH (ref 0.0–38.6)

## 2021-06-19 LAB — CANCER ANTIGEN 15-3: CA 15-3: 1450 U/mL — ABNORMAL HIGH (ref 0.0–25.0)

## 2021-06-24 ENCOUNTER — Ambulatory Visit (HOSPITAL_COMMUNITY): Payer: Medicare Other | Admitting: Hematology

## 2021-07-01 ENCOUNTER — Ambulatory Visit (HOSPITAL_COMMUNITY)
Admission: RE | Admit: 2021-07-01 | Discharge: 2021-07-01 | Disposition: A | Discharge status: Home / Self Care | Payer: Medicare Other | Source: Ambulatory Visit | Attending: Hematology | Admitting: Hematology

## 2021-07-01 DIAGNOSIS — D0511 Intraductal carcinoma in situ of right breast: Secondary | ICD-10-CM | POA: Diagnosis present

## 2021-07-01 DIAGNOSIS — C50411 Malignant neoplasm of upper-outer quadrant of right female breast: Secondary | ICD-10-CM | POA: Insufficient documentation

## 2021-07-01 DIAGNOSIS — Z170 Estrogen receptor positive status [ER+]: Secondary | ICD-10-CM | POA: Insufficient documentation

## 2021-07-01 MED ORDER — FLUDEOXYGLUCOSE F - 18 (FDG) INJECTION
6.2200 | Freq: Once | INTRAVENOUS | Status: AC | PRN
  Administered 2021-07-01: 6.22 via INTRAVENOUS

## 2021-07-07 ENCOUNTER — Inpatient Hospital Stay (HOSPITAL_COMMUNITY): Payer: Medicare Other | Attending: Hematology | Admitting: Hematology

## 2021-07-07 VITALS — BP 130/77 | HR 67 | Temp 98.6°F | Resp 16 | Ht 63.0 in | Wt 116.2 lb

## 2021-07-07 DIAGNOSIS — Z79899 Other long term (current) drug therapy: Secondary | ICD-10-CM | POA: Insufficient documentation

## 2021-07-07 DIAGNOSIS — C50411 Malignant neoplasm of upper-outer quadrant of right female breast: Secondary | ICD-10-CM | POA: Insufficient documentation

## 2021-07-07 DIAGNOSIS — C7951 Secondary malignant neoplasm of bone: Secondary | ICD-10-CM | POA: Diagnosis not present

## 2021-07-07 DIAGNOSIS — Z170 Estrogen receptor positive status [ER+]: Secondary | ICD-10-CM | POA: Diagnosis not present

## 2021-07-07 DIAGNOSIS — M810 Age-related osteoporosis without current pathological fracture: Secondary | ICD-10-CM | POA: Insufficient documentation

## 2021-07-07 DIAGNOSIS — D0511 Intraductal carcinoma in situ of right breast: Secondary | ICD-10-CM | POA: Diagnosis not present

## 2021-07-07 NOTE — Progress Notes (Signed)
? ?[facility] ?[address] ?[place], [place] [address] ? ? ?Patient Care Team: ?Asencion Noble, MD as PCP - General (Internal Medicine) ? ?SUMMARY OF ONCOLOGIC HISTORY: ?Oncology History  ?Breast cancer of upper-outer quadrant of right female breast ([place])  ?04/02/2018 Initial Diagnosis  ? Breast cancer of upper-outer quadrant of right female breast [name]) ?  ?08/14/2019 Cancer Staging  ? Staging form: Breast, AJCC 8th Edition ?- Clinical stage from 08/14/2019: Stage IIA (rcT1b, cN1, cM0, G1, ER+, PR-, HER2-) - Signed by Derek Jack, MD on 08/14/2019 ? ?  ? ? ?CHIEF COMPLIANT: Follow-up of breast cancer ? ? ?INTERVAL HISTORY: Caitlin Alexander is a 86 y.o. female here today for follow up of her breast cancer. Her last visit was on 03/18/2021.  ? ?Today she reports feeling well, and she is accompanied by her daughter-in-law. She denies back pain, rectal pain, and new pains. She reports constipation for which she is taking Miralax. She denies SOB. She has been taking 3 ativan daily for 20 years. Her daughter-in-law reports her confusion and memory have worsened.  ? ?REVIEW OF SYSTEMS:   ?Review of Systems  ?Constitutional:  Negative for appetite change and fatigue.  ?Respiratory:  Negative for shortness of breath.   ?Gastrointestinal:  Positive for constipation.  ?Musculoskeletal:  Negative for back pain.  ?Psychiatric/Behavioral:  Positive for confusion.   ?All other systems reviewed and are negative. ? ?I have reviewed the past medical history, past surgical history, social history and family history with the patient and they are unchanged from previous note. ? ? ?ALLERGIES:   ?is allergic to macrodantin [nitrofurantoin macrocrystal], nystatin, promethazine, sulfa antibiotics, suprax [cefixime], and penicillins. ? ? ?MEDICATIONS:  ?Current Outpatient Medications  ?Medication Sig Dispense Refill  ? LORazepam (ATIVAN) 1 MG tablet Take 1 mg by mouth 3 (three) times daily.     ? polyethylene glycol (MIRALAX  / GLYCOLAX) packet Take 17 g by mouth daily.    ? ?No current facility-administered medications for this visit.  ? ? ? ?PHYSICAL EXAMINATION: ?Performance status (ECOG): 1 - Symptomatic but completely ambulatory ? ?Vitals:  ? 07/07/21 1449  ?BP: 130/77  ?Pulse: 67  ?Resp: 16  ?Temp: 98.6 ?F (37 ?C)  ?SpO2: 95%  ? ?Wt Readings from Last 3 Encounters:  ?07/07/21 116 lb 4 oz (52.7 kg)  ?03/18/21 118 lb 3.2 oz (53.6 kg)  ?02/03/21 117 lb 6.4 oz (53.3 kg)  ? ?Physical Exam ?Vitals reviewed.  ?Constitutional:   ?   Appearance: Normal appearance.  ?Cardiovascular:  ?   Rate and Rhythm: Normal rate and regular rhythm.  ?   Pulses: Normal pulses.  ?   Heart sounds: Normal heart sounds.  ?Pulmonary:  ?   Effort: Pulmonary effort is normal.  ?   Breath sounds: Normal breath sounds.  ?Neurological:  ?   General: No focal deficit present.  ?   Mental Status: She is alert and oriented to person, place, and time.  ?Psychiatric:     ?   Mood and Affect: Mood normal.     ?   Behavior: Behavior normal.  ? ? ?Breast Exam Chaperone: Thana Ates   ? ? ?LABORATORY DATA:  ?I have reviewed the data as listed ? ?  Latest Ref Rng & Units 06/17/2021  ?  1:02 PM 03/18/2021  ?  2:05 PM 02/03/2021  ? 12:44 PM  ?CMP  ?Glucose 70 - 99 mg/dL 96   113   92    ?BUN  8 - 23 mg/dL _0 ?Creatinine 0.44 - 1.00 mg/dL 0.71   0.71   0.76    ?Sodium 135 - 145 mmol/L 140   134   138    ?Potassium 3.5 - 5.1 mmol/L 4.7   4.5   4.3    ?Chloride 98 - 111 mmol/L 106   100   101    ?CO2 22 - 32 mmol/L _1 ?Calcium 8.9 - 10.3 mg/dL 8.9   8.9   9.1    ?Total Protein 6.5 - 8.1 g/dL 6.7   6.9   6.8    ?Total Bilirubin 0.3 - 1.2 mg/dL 0.5   0.6   0.8    ?Alkaline Phos 38 - 126 U/L 91   79   72    ?AST 15 - 41 U/L _2 ?ALT 0 - 44 U/L _3 ? ?Lab Results  ?Component Value Date  ? KGU542 1,450.0 (H) 06/17/2021  ? HCW237 244.0 (H) 02/03/2021  ? SEG315 250.0 (H) 12/10/2020  ? ?Lab Results  ?Component Value Date  ? WBC 7.9  06/17/2021  ? HGB 13.3 06/17/2021  ? HCT 41.2 06/17/2021  ? MCV 95.4 06/17/2021  ? PLT 322 06/17/2021  ? NEUTROABS 3.3 06/17/2021  ? ? ?ASSESSMENT:  ?1.  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  Declined tamoxifen. ?-Right mastectomy on 06/11/2019 for recurrence.  0.6 cm IDC, grade 1, 1/2 lymph nodes positive with extracapsular extension.  ER was 100%.  PR 0%.  HER-2 was negative.  Ki-67 2%.  PT1BPN1A. ?-She was not interested in pursuing radiation therapy. ?-She started tamoxifen on 08/14/2019. ?-She stopped tamoxifen in the middle of June 2021 due to when she sat still she could feel her heartbeat. She had multiple EKGs at her primary care doctor's office and they were all normal. She does not want to go back on any antiestrogen pills. ?- PET scan on 1/76/1607 with metabolic activity in the right fourth rib, sclerotic lesion in T9 vertebral body and right iliac mass. ?- Right iliac bone biopsy on 12/15/2020 consistent with metastatic carcinoma, ER/PR 90% positive, Ki-67 15%, HER2 2+ by IHC and negative by FISH. ?  ?  ?2.  Osteoporosis: ?-Bone density on 09/21/2017 showed T score of -3.2 in the right femoral neck. ?-Prolia was recommended in the past which was declined. ? ? ?PLAN:  ?1.  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  She did not start taking it as she was afraid of the side effects. ?- We reviewed labs today which showed significant increase in tumor markers CA 15-3 and CA 27-29.  CBC and CMP was grossly normal. ?- Reviewed images of the PET scan from 07/01/2021: Numerous new hypermetabolic lesions in the cervical thoracic spine, bilateral ribs and pelvis compatible with worsening metastatic disease.  New trace right pleural effusion. ?- Focal hypermetabolic activity in the rectum.  She denies any bleeding per rectum.   She has some constipation. ?- Recommend GI evaluation for sigmoidoscopy/colonoscopy. ?- Patient had couple of episodes of confusion.  Recommend MRI of the brain with and without contrast. ?- We talked at length about restarting antiestrogen  therapy.  Patient is not interested in restarting antiestrogen therapy because of arthralgias and myalgias.  She is okay to start chemotherapy. ?- We talked about Xeloda.  We discussed side effects.  We have given literature to her.  She will review it and let us know if she is interested.  I will see her back after the brain scan. ?  ?2.  Osteoporosis/bone metastasis: ?- She has declined bisphosphonate therapy multiple times in the past. ?- Continue calcium and vitamin D supplements. ? ?Breast Cancer therapy associated bone loss: I have recommended calcium, Vitamin D and weight bearing exercises. ? ?Orders placed this encounter:  ?No orders of the defined types were placed in this encounter. ? ? ?The patient has a good understanding of the overall plan. She agrees with it. She will call with any problems that may develop before the next visit here. ? ?Derek Jack, MD ?[facility] ?[phone] ? ? ?I, Thana Ates, am acting as a scribe for Dr. Derek Jack. ? ?I, Derek Jack MD, have reviewed the above documentation for accuracy and completeness, and I agree with the above. ?  ? ? ?

## 2021-07-07 NOTE — Patient Instructions (Signed)
[place] at [facility] ?Discharge Instructions ? ?You were seen and examined today by Dr. Delton Coombes. He reviewed your most recent scan and it is showing multiple lesions from where your cancer has spread. He is ordering an MRI of your brain and referring you to be seen by gastrointestinal doctor. Please keep follow up appointments as scheduled. ? ? ?Thank you for choosing [place] at [facility] to provide your oncology and hematology care.  To afford each patient quality time with our provider, please arrive at least 15 minutes before your scheduled appointment time.  ? ?If you have a lab appointment with the [facility] please come in thru the Main Entrance and check in at the main information desk. ? ?You need to re-schedule your appointment should you arrive 10 or more minutes late.  We strive to give you quality time with our providers, and arriving late affects you and other patients whose appointments are after yours.  Also, if you no show three or more times for appointments you may be dismissed from the clinic at the providers discretion.     ?Again, thank you for choosing [facility].  Our hope is that these requests will decrease the amount of time that you wait before being seen by our physicians.       ?_____________________________________________________________ ? ?Should you have questions after your visit to [facility], please contact our office at [phone] and follow the prompts.  Our office hours are 8:00 a.m. and 4:30 p.m. Monday - Friday.  Please note that voicemails left after 4:00 p.m. may not be returned until the following business day.  We are closed weekends and major holidays.  You do have access to a nurse 24-7, just call the main number to the clinic [phone] and do not press any options, hold on the line and a nurse will answer the phone.   ? ?For prescription refill requests, have your pharmacy  contact our office and allow 72 hours.   ? ?Due to Covid, you will need to wear a mask upon entering the hospital. If you do not have a mask, a mask will be given to you at the Main Entrance upon arrival. For doctor visits, patients may have 1 support person age 19 or older with them. For treatment visits, patients can not have anyone with them due to social distancing guidelines and our immunocompromised population.  ? ?  ?

## 2021-07-08 ENCOUNTER — Encounter (HOSPITAL_COMMUNITY): Payer: Self-pay

## 2021-07-08 ENCOUNTER — Encounter (INDEPENDENT_AMBULATORY_CARE_PROVIDER_SITE_OTHER): Payer: Self-pay | Admitting: *Deleted

## 2021-07-08 NOTE — Progress Notes (Signed)
Written information on Xeloda provided to patient. All questions addressed and answered to the patient and family's satisfaction. I provided my contact information for the patient to call with any questions or concerns. ?

## 2021-07-12 ENCOUNTER — Encounter (HOSPITAL_COMMUNITY): Payer: Self-pay

## 2021-07-12 NOTE — Progress Notes (Signed)
Call received from [place], patient's daughter in law, that the patient has decided that she does not wish to take Xeloda at this time. Dr. Delton Coombes made aware. Patient to follow-up as scheduled. ?

## 2021-07-23 ENCOUNTER — Ambulatory Visit (HOSPITAL_COMMUNITY)
Admission: RE | Admit: 2021-07-23 | Discharge: 2021-07-23 | Disposition: A | Discharge status: Home / Self Care | Payer: Medicare Other | Source: Ambulatory Visit | Attending: Hematology | Admitting: Hematology

## 2021-07-23 DIAGNOSIS — D0511 Intraductal carcinoma in situ of right breast: Secondary | ICD-10-CM

## 2021-07-23 DIAGNOSIS — Z170 Estrogen receptor positive status [ER+]: Secondary | ICD-10-CM | POA: Insufficient documentation

## 2021-07-23 DIAGNOSIS — C50411 Malignant neoplasm of upper-outer quadrant of right female breast: Secondary | ICD-10-CM | POA: Insufficient documentation

## 2021-07-23 MED ORDER — GADOBUTROL 1 MMOL/ML IV SOLN
5.0000 mL | Freq: Once | INTRAVENOUS | Status: AC | PRN
  Administered 2021-07-23: 5 mL via INTRAVENOUS

## 2021-07-28 ENCOUNTER — Inpatient Hospital Stay (HOSPITAL_COMMUNITY): Payer: Medicare Other | Attending: Hematology | Admitting: Hematology

## 2021-07-28 VITALS — BP 119/90 | HR 72 | Temp 98.3°F | Resp 16 | Ht 63.0 in | Wt 114.5 lb

## 2021-07-28 DIAGNOSIS — Z79899 Other long term (current) drug therapy: Secondary | ICD-10-CM | POA: Diagnosis not present

## 2021-07-28 DIAGNOSIS — C7951 Secondary malignant neoplasm of bone: Secondary | ICD-10-CM | POA: Diagnosis not present

## 2021-07-28 DIAGNOSIS — D0511 Intraductal carcinoma in situ of right breast: Secondary | ICD-10-CM | POA: Diagnosis not present

## 2021-07-28 DIAGNOSIS — C50411 Malignant neoplasm of upper-outer quadrant of right female breast: Secondary | ICD-10-CM | POA: Insufficient documentation

## 2021-07-28 DIAGNOSIS — Z170 Estrogen receptor positive status [ER+]: Secondary | ICD-10-CM | POA: Insufficient documentation

## 2021-07-28 DIAGNOSIS — M810 Age-related osteoporosis without current pathological fracture: Secondary | ICD-10-CM | POA: Insufficient documentation

## 2021-07-28 DIAGNOSIS — M545 Low back pain, unspecified: Secondary | ICD-10-CM | POA: Insufficient documentation

## 2021-07-28 NOTE — Progress Notes (Signed)
? ?[facility] ?[address] ?[place], [place] [address] ? ? ?Patient Care Team: ?Asencion Noble, MD as PCP - General (Internal Medicine) ? ?SUMMARY OF ONCOLOGIC HISTORY: ?Oncology History  ?Breast cancer of upper-outer quadrant of right female breast ([place])  ?04/02/2018 Initial Diagnosis  ? Breast cancer of upper-outer quadrant of right female breast ([place]) ? ?  ?08/14/2019 Cancer Staging  ? Staging form: Breast, AJCC 8th Edition ?- Clinical stage from 08/14/2019: Stage IIA (rcT1b, cN1, cM0, G1, ER+, PR-, HER2-) - Signed by Derek Jack, MD on 08/14/2019 ? ?  ? ? ?CHIEF COMPLIANT: Follow-up of breast cancer ? ? ?INTERVAL HISTORY: Caitlin Alexander is a 86 y.o. female here today for follow up of her breast cancer. Her last visit was on 07/07/2021.  ? ?Today she reports feeling good, and she is accompanied by her daughter-in-law. She reports back pain. She is taking Tylenol prn.  ? ?REVIEW OF SYSTEMS:   ?Review of Systems  ?Constitutional:  Negative for appetite change and fatigue.  ?Cardiovascular:  Positive for palpitations.  ?Gastrointestinal:  Positive for constipation.  ?Musculoskeletal:  Positive for back pain (6/10).  ?Neurological:  Positive for dizziness.  ?All other systems reviewed and are negative. ? ?I have reviewed the past medical history, past surgical history, social history and family history with the patient and they are unchanged from previous note. ? ? ?ALLERGIES:   ?is allergic to macrodantin [nitrofurantoin macrocrystal], nystatin, promethazine, sulfa antibiotics, suprax [cefixime], and penicillins. ? ? ?MEDICATIONS:  ?Current Outpatient Medications  ?Medication Sig Dispense Refill  ? Acetaminophen (TYLENOL PO) Take by mouth.    ? LORazepam (ATIVAN) 1 MG tablet Take 1 mg by mouth 3 (three) times daily.     ? polyethylene glycol (MIRALAX / GLYCOLAX) packet Take 17 g by mouth daily.    ? ?No current facility-administered medications for this visit.  ? ? ? ?PHYSICAL  EXAMINATION: ?Performance status (ECOG): 1 - Symptomatic but completely ambulatory ? ?Vitals:  ? 07/28/21 1158  ?BP: 119/90  ?Pulse: 72  ?Resp: 16  ?Temp: 98.3 ?F (36.8 ?C)  ?SpO2: 98%  ? ?Wt Readings from Last 3 Encounters:  ?07/28/21 114 lb 8 oz (51.9 kg)  ?07/07/21 116 lb 4 oz (52.7 kg)  ?03/18/21 118 lb 3.2 oz (53.6 kg)  ? ?Physical Exam ? ?Breast Exam Chaperone: Thana Ates   ? ? ?LABORATORY DATA:  ?I have reviewed the data as listed ? ?  Latest Ref Rng & Units 06/17/2021  ?  1:02 PM 03/18/2021  ?  2:05 PM 02/03/2021  ? 12:44 PM  ?CMP  ?Glucose 70 - 99 mg/dL 96   113   92    ?BUN 8 - 23 mg/dL 11   9   10     ?Creatinine 0.44 - 1.00 mg/dL 0.71   0.71   0.76    ?Sodium 135 - 145 mmol/L 140   134   138    ?Potassium 3.5 - 5.1 mmol/L 4.7   4.5   4.3    ?Chloride 98 - 111 mmol/L 106   100   101    ?CO2 22 - 32 mmol/L 27   26   27     ?Calcium 8.9 - 10.3 mg/dL 8.9   8.9   9.1    ?Total Protein 6.5 - 8.1 g/dL 6.7   6.9   6.8    ?Total Bilirubin 0.3 - 1.2 mg/dL 0.5   0.6   0.8    ?Alkaline Phos  38 - 126 U/L 91   79   72    ?AST 15 - 41 U/L 26   27   26     ?ALT 0 - 44 U/L 20   17   19     ? ?Lab Results  ?Component Value Date  ? SXJ155 1,450.0 (H) 06/17/2021  ? MCE022 244.0 (H) 02/03/2021  ? VVK122 250.0 (H) 12/10/2020  ? ?Lab Results  ?Component Value Date  ? WBC 7.9 06/17/2021  ? HGB 13.3 06/17/2021  ? HCT 41.2 06/17/2021  ? MCV 95.4 06/17/2021  ? PLT 322 06/17/2021  ? NEUTROABS 3.3 06/17/2021  ? ? ?ASSESSMENT:  ?1.  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  Declined tamoxifen. ?-Right mastectomy on 06/11/2019 for recurrence.  0.6 cm IDC, grade 1, 1/2 lymph nodes positive with extracapsular extension.  ER was 100%.  PR 0%.  HER-2 was negative.  Ki-67 2%.  PT1BPN1A. ?-She was not interested in pursuing radiation therapy. ?-She started tamoxifen on 08/14/2019. ?-She stopped tamoxifen in the  middle of June 2021 due to when she sat still she could feel her heartbeat. She had multiple EKGs at her primary care doctor's office and they were all normal. She does not want to go back on any antiestrogen pills. ?- PET scan on 4/49/7530 with metabolic activity in the right fourth rib, sclerotic lesion in T9 vertebral body and right iliac mass. ?- Right iliac bone biopsy on 12/15/2020 consistent with metastatic carcinoma, ER/PR 90% positive, Ki-67 15%, HER2 2+ by IHC and negative by FISH. ?  ?  ?2.  Osteoporosis: ?-Bone density on 09/21/2017 showed T score of -3.2 in the right femoral neck. ?-Prolia was recommended in the past which was declined. ? ? ?PLAN:  ?1.  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  New trace right pleural effusion. ?- We reviewed MRI of the brain from 07/23/2021 which did not show any parenchymal metastatic disease. ?- She has refused antiestrogen therapy due to side effects.  We have given literature about Xeloda at last visit which she has reviewed.  She is concerned about cardiac toxicities. ?- We discussed further treatment options, but due to potential side effects she is not interested. ?- She has low back pain which is new, well controlled with Tylenol as needed. ?- I have recommended best supportive care in the form of palliative/hospice.  We will make referral to palliative and she will transition to hospice. ? ?  ?2.  Osteoporosis/bone metastasis: ?- Continue calcium and vitamin D supplements.  She declined bisphosphonate therapy in the past. ? ?Breast Cancer therapy associated bone loss: I have recommended calcium, Vitamin D and weight bearing exercises. ? ?Orders placed this encounter:  ?No orders of the defined types were placed in this encounter. ? ? ?The patient has a good understanding of the  overall plan. She agrees with it. She will call with any problems that may develop before the next visit here. ? ?Derek Jack, MD ?[place] ?[phone] ? ? ?I, Thana Ates, am acting as a scribe for Dr. Derek Jack. ? ?I, Derek Jack MD, have reviewed the above documentation for accuracy and completeness, and I agree with the above. ?  ? ? ?

## 2021-07-28 NOTE — Patient Instructions (Addendum)
[place] at [facility] ?Discharge Instructions ? ? ?You were seen and examined today by Dr. Delton Coombes. ? ?He reviewed the results of your MRI. Your brain does not have cancer in it, but there are cancer lesions on the bone of your skull.  ? ?He discussed having you enroll in palliative care.  ? ?Return as needed.  ? ? ?Thank you for choosing [place] at [facility] to provide your oncology and hematology care.  To afford each patient quality time with our provider, please arrive at least 15 minutes before your scheduled appointment time.  ? ?If you have a lab appointment with the [facility] please come in thru the Main Entrance and check in at the main information desk. ? ?You need to re-schedule your appointment should you arrive 10 or more minutes late.  We strive to give you quality time with our providers, and arriving late affects you and other patients whose appointments are after yours.  Also, if you no show three or more times for appointments you may be dismissed from the clinic at the providers discretion.     ?Again, thank you for choosing [facility].  Our hope is that these requests will decrease the amount of time that you wait before being seen by our physicians.       ?_____________________________________________________________ ? ?Should you have questions after your visit to  Hospital, please contact our office at [phone] and follow the prompts.  Our office hours are 8:00 a.m. and 4:30 p.m. Monday - Friday.  Please note that voicemails left after 4:00 p.m. may not be returned until the following business day.  We are closed weekends and major holidays.  You do have access to a nurse 24-7, just call the main number to the clinic [phone] and do not press any options, hold on the line and a nurse will answer the phone.   ? ?For prescription refill requests, have your pharmacy contact our office and allow 72  hours.   ? ?Due to Covid, you will need to wear a mask upon entering the hospital. If you do not have a mask, a mask will be given to you at the Main Entrance upon arrival. For doctor visits, patients may have 1 support person age 59 or older with them. For treatment visits, patients can not have anyone with them due to social distancing guidelines and our immunocompromised population.  ? ?   ?

## 2021-09-02 ENCOUNTER — Ambulatory Visit (INDEPENDENT_AMBULATORY_CARE_PROVIDER_SITE_OTHER): Payer: Medicare Other | Admitting: Gastroenterology

## 2021-09-05 ENCOUNTER — Encounter (HOSPITAL_COMMUNITY): Payer: Self-pay | Admitting: Emergency Medicine

## 2021-09-05 ENCOUNTER — Emergency Department (HOSPITAL_COMMUNITY)
Admission: EM | Admit: 2021-09-05 | Discharge: 2021-09-05 | Disposition: A | Discharge status: Home / Self Care | Payer: Medicare Other | Attending: Emergency Medicine | Admitting: Emergency Medicine

## 2021-09-05 DIAGNOSIS — M545 Low back pain, unspecified: Secondary | ICD-10-CM | POA: Diagnosis not present

## 2021-09-05 DIAGNOSIS — Z853 Personal history of malignant neoplasm of breast: Secondary | ICD-10-CM | POA: Diagnosis not present

## 2021-09-05 MED ORDER — ACETAMINOPHEN 325 MG PO TABS
650.0000 mg | ORAL_TABLET | Freq: Once | ORAL | Status: DC
  Filled 2021-09-05: qty 2

## 2021-09-05 MED ORDER — LIDOCAINE 5 % EX PTCH: 1.0000 | MEDICATED_PATCH | CUTANEOUS | 0 refills | Status: DC

## 2021-09-05 NOTE — Discharge Instructions (Addendum)
Your EKG was normal.  Return to the ER if your symptoms worsen or if you have any additional concerns or would like any additional medications.  Otherwise follow-up with your oncologist or primary care team this week.

## 2021-09-05 NOTE — ED Provider Notes (Addendum)
[facility] Provider Note   CSN: [id number] Arrival date & time: 09/05/21  2114     History  Chief Complaint  Patient presents with   Back Pain    Caitlin Alexander is a 86 y.o. female.  Patient with history of stage IV metastatic breast cancer with bony metastases, presents with complaint of pain.  She has been having back pain for several months, she complained to her family that it got worse today and was brought to the ER.  She is followed by Dr. Delton Coombes, however she has declined hospice and palliative care, prefers to follow-up with her primary care doctor and take Tylenol at home.  She states any other medication makes her feel nauseous and dizzy and she is unwilling to try additional medications.  No reports of fevers or cough or vomiting or diarrhea no new fall or trauma no new weakness or numbness per family.  She describes her pain currently as 5 out of 10.  She states that "Tylenol is enough for me right now."       Home Medications Prior to Admission medications   Medication Sig Start Date End Date Taking? Authorizing Provider  lidocaine (LIDODERM) 5 % Place 1 patch onto the skin daily. Remove & Discard patch within 12 hours or as directed by MD 09/05/21  Yes [place], Greggory Brandy, MD  Acetaminophen (TYLENOL PO) Take by mouth.    [provider]  LORazepam (ATIVAN) 1 MG tablet Take 1 mg by mouth 3 (three) times daily.     [provider]  polyethylene glycol (MIRALAX / GLYCOLAX) packet Take 17 g by mouth daily.    [provider]      Allergies    Macrodantin [nitrofurantoin macrocrystal], Nystatin, Promethazine, Sulfa antibiotics, Suprax [cefixime], and Penicillins    Review of Systems   Review of Systems  Constitutional:  Negative for fever.  HENT:  Negative for ear pain.   Eyes:  Negative for pain.  Respiratory:  Negative for cough.   Cardiovascular:  Negative for chest pain.  Gastrointestinal:  Negative for abdominal  pain.  Genitourinary:  Negative for flank pain.  Musculoskeletal:  Positive for back pain.  Skin:  Negative for rash.  Neurological:  Negative for headaches.    Physical Exam Updated Vital Signs BP (!) 150/73 (BP Location: Right Arm)   Pulse 68   Temp 98.2 F (36.8 C) (Oral)   Resp 18   Ht '5\' 3"'$  (1.6 m)   Wt 51.9 kg   SpO2 98%   BMI 20.27 kg/m  Physical Exam Constitutional:      General: She is not in acute distress.    Appearance: Normal appearance.  HENT:     Head: Normocephalic.     Nose: Nose normal.  Eyes:     Extraocular Movements: Extraocular movements intact.  Cardiovascular:     Rate and Rhythm: Normal rate.  Pulmonary:     Effort: Pulmonary effort is normal.  Musculoskeletal:        General: Normal range of motion.     Cervical back: Normal range of motion.     Comments: No C or T or L-spine step-offs or tenderness noted.  No CVA tenderness noted.  Neurological:     General: No focal deficit present.     Mental Status: She is alert and oriented to person, place, and time. Mental status is at baseline.     Cranial Nerves: No cranial nerve deficit.  Motor: No weakness.     Comments: Patient appears awake alert comfortable answering all questions appropriately.     ED Results / Procedures / Treatments   Labs (all labs ordered are listed, but only abnormal results are displayed) Labs Reviewed - No data to display  EKG EKG Interpretation  Date/Time:  'Sunday September 05 2021 21:47:00 EDT Ventricular Rate:  67 PR Interval:  155 QRS Duration: 85 QT Interval:  413 QTC Calculation: 436 R Axis:   48 Text Interpretation: Sinus rhythm Low voltage, precordial leads Confirmed by Ozil Stettler ([id number]) on 09/05/2021 9:53:19 PM  Radiology No results found.  Procedures Procedures    Medications Ordered in ED Medications  acetaminophen (TYLENOL) tablet 650 mg (650 mg Oral Patient Refused/Not Given 09/05/21 2153)    ED Course/ Medical Decision Making/ A&P                            Medical Decision Making Risk OTC drugs. Prescription drug management.   Review of external records shows office visit Jul 30, 2021 for malignant neoplasm.  History from family at bedside.  Cardiac monitoring showing sinus rhythm.  Dennis states include EKG showing sinus rhythm no ST elevations or depressions no T wave inversions are noted.  Had a lengthy discussion with the patient and her caregiver at bedside.  I was not sure what exactly the patient was requesting.  She appears comfortable at this time and declines any other pain medications.  She states that she gets severely nauseous with any kind of narcotic medication.  I offered to give her Zofran which she refused.  She describes her pain as well controlled and 5 out of 10 currently but has had exacerbations in the past.  I recommended following up with pain specialist or oncology, however she says she prefers to follow-up with her primary care doctor.  Her oncology team had set her up with palliative care and hospice care, however family states that she terminated hospice care and prefers to follow-up with her primary care team.  Patient states that she had intermittent chest pain but denies any pain at this time.  EKG is unremarkable.  Patient declining any additional medications at this time appears comfortable.  Will be discharged home to continue outpatient care.  Family is at bedside, stating that they are not sure what she wants either.          Final Clinical Impression(s) / ED Diagnoses Final diagnoses:  Chronic low back pain, unspecified back pain laterality, unspecified whether sciatica present    Rx / DC Orders ED Discharge Orders          Ordered    lidocaine (LIDODERM) 5 %  Every 24 hours        06'$ /11/23 2219              Luna Fuse, MD 09/05/21 2219    Luna Fuse, MD 09/05/21 2220

## 2021-09-05 NOTE — ED Triage Notes (Signed)
Pt c/o lower back pain from CA that has spread to her bones. Pt states she is unable to take any pain medication other than tylenol. Pt last took tylenol at 1830.

## 2021-11-12 ENCOUNTER — Telehealth: Payer: Self-pay | Admitting: *Deleted

## 2021-11-12 ENCOUNTER — Encounter: Payer: Self-pay | Admitting: *Deleted

## 2021-11-12 NOTE — Patient Outreach (Signed)
  Care Coordination   Initial Visit Note   11/12/2021 Name: Caitlin Alexander MRN: [id number] DOB: [birth year]  Caitlin Alexander is a 86 y.o. year old female who sees Caitlin Noble, MD for primary care. I  spoke with daughter-in-law and caregiver, Caitlin Alexander, by telephone.  What matters to the patients health and wellness today?  Per Caitlin Alexander, patient is in the end stages of life. Previous hospice patient but dismissed them last month. No needs identified at this time.   SDOH assessments and interventions completed:  Yes  SDOH Interventions Today    Flowsheet Row Most Recent Value  SDOH Interventions   Housing Interventions Intervention Not Indicated  Transportation Interventions Intervention Not Indicated        Care Coordination Interventions Activated:  No Declined services today. Provided verbal education on [facility] program. Encouraged to reach out to Dr Caitlin Alexander office for a referral if they have any future needs.  Care Coordination Interventions:  No, not indicated   Follow up plan: No further intervention required.   Encounter Outcome:  Pt. Visit Completed   Caitlin Alexander, BSN, RN-BC RN Care Coordinator Direct Dial: [phone]

## 2021-12-31 ENCOUNTER — Other Ambulatory Visit: Payer: Self-pay

## 2021-12-31 ENCOUNTER — Emergency Department (HOSPITAL_COMMUNITY): Payer: Medicare Other

## 2021-12-31 ENCOUNTER — Encounter (HOSPITAL_COMMUNITY): Payer: Self-pay | Admitting: *Deleted

## 2021-12-31 ENCOUNTER — Emergency Department (HOSPITAL_COMMUNITY)
Admission: EM | Admit: 2021-12-31 | Discharge: 2021-12-31 | Disposition: A | Discharge status: Home / Self Care | Payer: Medicare Other | Attending: Emergency Medicine | Admitting: Emergency Medicine

## 2021-12-31 DIAGNOSIS — S50312A Abrasion of left elbow, initial encounter: Secondary | ICD-10-CM | POA: Diagnosis not present

## 2021-12-31 DIAGNOSIS — S51011A Laceration without foreign body of right elbow, initial encounter: Secondary | ICD-10-CM | POA: Diagnosis not present

## 2021-12-31 DIAGNOSIS — W1839XA Other fall on same level, initial encounter: Secondary | ICD-10-CM | POA: Diagnosis not present

## 2021-12-31 DIAGNOSIS — C50919 Malignant neoplasm of unspecified site of unspecified female breast: Secondary | ICD-10-CM | POA: Diagnosis present

## 2021-12-31 DIAGNOSIS — G319 Degenerative disease of nervous system, unspecified: Secondary | ICD-10-CM | POA: Insufficient documentation

## 2021-12-31 DIAGNOSIS — E860 Dehydration: Secondary | ICD-10-CM | POA: Diagnosis not present

## 2021-12-31 DIAGNOSIS — S0990XA Unspecified injury of head, initial encounter: Secondary | ICD-10-CM | POA: Insufficient documentation

## 2021-12-31 DIAGNOSIS — W19XXXA Unspecified fall, initial encounter: Secondary | ICD-10-CM

## 2021-12-31 LAB — COMPREHENSIVE METABOLIC PANEL
ALT: 18 U/L (ref 0–44)
AST: 43 U/L — ABNORMAL HIGH (ref 15–41)
Albumin: 3 g/dL — ABNORMAL LOW (ref 3.5–5.0)
Alkaline Phosphatase: 252 U/L — ABNORMAL HIGH (ref 38–126)
Anion gap: 10 (ref 5–15)
BUN: 21 mg/dL (ref 8–23)
CO2: 25 mmol/L (ref 22–32)
Calcium: 9.6 mg/dL (ref 8.9–10.3)
Chloride: 99 mmol/L (ref 98–111)
Creatinine, Ser: 1.01 mg/dL — ABNORMAL HIGH (ref 0.44–1.00)
GFR, Estimated: 55 mL/min — ABNORMAL LOW (ref >60)
Glucose, Bld: 116 mg/dL — ABNORMAL HIGH (ref 70–99)
Potassium: 4.1 mmol/L (ref 3.5–5.1)
Sodium: 134 mmol/L — ABNORMAL LOW (ref 135–145)
Total Bilirubin: 1.2 mg/dL (ref 0.3–1.2)
Total Protein: 6 g/dL — ABNORMAL LOW (ref 6.5–8.1)

## 2021-12-31 LAB — CBC WITH DIFFERENTIAL/PLATELET
Abs Immature Granulocytes: 0.02 10*3/uL (ref 0.00–0.07)
Basophils Absolute: 0.1 10*3/uL (ref 0.0–0.1)
Basophils Relative: 1 %
Eosinophils Absolute: 0 10*3/uL (ref 0.0–0.5)
Eosinophils Relative: 1 %
HCT: 48.7 % — ABNORMAL HIGH (ref 36.0–46.0)
Hemoglobin: 16.5 g/dL — ABNORMAL HIGH (ref 12.0–15.0)
Immature Granulocytes: 0 %
Lymphocytes Relative: 34 %
Lymphs Abs: 2 10*3/uL (ref 0.7–4.0)
MCH: 30.3 pg (ref 26.0–34.0)
MCHC: 33.9 g/dL (ref 30.0–36.0)
MCV: 89.5 fL (ref 80.0–100.0)
Monocytes Absolute: 0.7 10*3/uL (ref 0.1–1.0)
Monocytes Relative: 13 %
Neutro Abs: 3 10*3/uL (ref 1.7–7.7)
Neutrophils Relative %: 51 %
Platelets: 265 10*3/uL (ref 150–400)
RBC: 5.44 MIL/uL — ABNORMAL HIGH (ref 3.87–5.11)
RDW: 14.1 % (ref 11.5–15.5)
WBC: 5.9 10*3/uL (ref 4.0–10.5)
nRBC: 0 % (ref 0.0–0.2)

## 2021-12-31 LAB — CBG MONITORING, ED: Glucose-Capillary: 167 mg/dL — ABNORMAL HIGH (ref 70–99)

## 2021-12-31 LAB — LIPASE, BLOOD: Lipase: 53 U/L — ABNORMAL HIGH (ref 11–51)

## 2021-12-31 MED ORDER — SODIUM CHLORIDE 0.9 % IV SOLN: INTRAVENOUS | Status: DC

## 2021-12-31 MED ORDER — SODIUM CHLORIDE 0.9 % IV BOLUS
1000.0000 mL | Freq: Once | INTRAVENOUS | Status: AC
  Administered 2021-12-31: 1000 mL via INTRAVENOUS

## 2021-12-31 NOTE — ED Notes (Signed)
In room to discharge patient and DIL at bedside and states that patient lives by self, has fallen multiple times today and is unsafe alone there. Per family, patient is refusing to allow Hospice staff in home. States that they cannot care for patient and she needs SNF placement. Asking for SW to get involved. Informed her that I would notify MD and see what he says. If patient is to be discharged she will need EMS transport home. MD to speak with family again.

## 2021-12-31 NOTE — Discharge Instructions (Signed)
Would recommend hospice care but understand that you have refused that.  Today's work-up was consistent with some clinical dehydration he received some IV fluids here.

## 2021-12-31 NOTE — ED Triage Notes (Signed)
Pt brought in by [facility] for c/o multiple falls today; pt has skin  tear to right elbow  Pt is a cancer pt  Cbg 159 126/74 BP 94% O2 P-88

## 2021-12-31 NOTE — ED Provider Notes (Signed)
[facility] EMERGENCY DEPARTMENT Provider Note   CSN: [id number] Arrival date & time: 12/31/21  1511     History  Chief Complaint  Patient presents with   Caitlin Alexander is a 86 y.o. female.  Patient with end-stage metastatic breast cancer.  Patient is no longer receiving any therapy.  Dr. Raliegh Ip at the cancer center has signed off on her patient supposed to be on hospice care but she refuses that.  She also refuses any home health care.  Family's been working with Dr. Willey Blade patient's primary care doctor.  And so far patient has been with it enough that she is able to refuse family has been looking for placement.  But patient is refusing.  Patient also refusing any care at home.  There is been multiple falls there is 3 falls today.  Falls are occurring quite frequently.  Patient does appear very dehydrated here today.  Patient insist on wanting to go home.  Also does not even want hospitalization but she is willing to allow [place] to do a work-up here.         Home Medications Prior to Admission medications   Medication Sig Start Date End Date Taking? Authorizing Provider  Acetaminophen (TYLENOL PO) Take by mouth.    [provider]  lidocaine (LIDODERM) 5 % Place 1 patch onto the skin daily. Remove & Discard patch within 12 hours or as directed by MD 09/05/21   Luna Fuse, MD  LORazepam (ATIVAN) 1 MG tablet Take 1 mg by mouth 3 (three) times daily.     [provider]  polyethylene glycol (MIRALAX / GLYCOLAX) packet Take 17 g by mouth daily.    [provider]      Allergies    Macrodantin [nitrofurantoin macrocrystal], Nystatin, Promethazine, Sulfa antibiotics, Suprax [cefixime], and Penicillins    Review of Systems   Review of Systems  Constitutional:  Positive for activity change, appetite change and fatigue. Negative for chills and fever.  HENT:  Negative for rhinorrhea and sore throat.   Eyes:  Negative for visual disturbance.  Respiratory:   Negative for cough and shortness of breath.   Cardiovascular:  Negative for chest pain and leg swelling.  Gastrointestinal:  Negative for abdominal pain, diarrhea, nausea and vomiting.  Genitourinary:  Negative for dysuria.  Musculoskeletal:  Negative for back pain and neck pain.  Skin:  Positive for wound. Negative for rash.  Neurological:  Positive for weakness. Negative for dizziness, light-headedness and headaches.  Hematological:  Does not bruise/bleed easily.  Psychiatric/Behavioral:  Negative for confusion.     Physical Exam Updated Vital Signs BP (!) 130/97   Pulse 86   Temp 97.7 F (36.5 C) (Oral)   Resp (!) 21   SpO2 98%  Physical Exam Vitals and nursing note reviewed.  Constitutional:      General: She is not in acute distress.    Appearance: Normal appearance. She is well-developed.  HENT:     Head: Normocephalic and atraumatic.     Mouth/Throat:     Mouth: Mucous membranes are dry.  Eyes:     Extraocular Movements: Extraocular movements intact.     Conjunctiva/sclera: Conjunctivae normal.     Pupils: Pupils are equal, round, and reactive to light.  Cardiovascular:     Rate and Rhythm: Normal rate and regular rhythm.     Heart sounds: No murmur heard. Pulmonary:     Effort: Pulmonary effort is normal. No respiratory distress.  Breath sounds: Normal breath sounds.  Abdominal:     Palpations: Abdomen is soft.     Tenderness: There is no abdominal tenderness.  Musculoskeletal:        General: Signs of injury present. No swelling.     Cervical back: Normal range of motion and neck supple.     Comments: Bruising to the left elbow superficial skin tear to the right elbow old well-healed abrasion to the left anterior leg.  Skin:    General: Skin is warm and dry.     Capillary Refill: Capillary refill takes less than 2 seconds.  Neurological:     General: No focal deficit present.     Mental Status: She is alert. Mental status is at baseline.     Cranial  Nerves: No cranial nerve deficit.     Sensory: No sensory deficit.     Motor: No weakness.  Psychiatric:        Mood and Affect: Mood normal.     ED Results / Procedures / Treatments   Labs (all labs ordered are listed, but only abnormal results are displayed) Labs Reviewed  CBC WITH DIFFERENTIAL/PLATELET - Abnormal; Notable for the following components:      Result Value   RBC 5.44 (*)    Hemoglobin 16.5 (*)    HCT 48.7 (*)    All other components within normal limits  LIPASE, BLOOD - Abnormal; Notable for the following components:   Lipase 53 (*)    All other components within normal limits  COMPREHENSIVE METABOLIC PANEL - Abnormal; Notable for the following components:   Sodium 134 (*)    Glucose, Bld 116 (*)    Creatinine, Ser 1.01 (*)    Total Protein 6.0 (*)    Albumin 3.0 (*)    AST 43 (*)    Alkaline Phosphatase 252 (*)    GFR, Estimated 55 (*)    All other components within normal limits  CBG MONITORING, ED - Abnormal; Notable for the following components:   Glucose-Capillary 167 (*)    All other components within normal limits    EKG None  Radiology CT Head Wo Contrast  Result Date: 12/31/2021 CLINICAL DATA:  Trauma, multiple falls EXAM: CT HEAD WITHOUT CONTRAST TECHNIQUE: Contiguous axial images were obtained from the base of the skull through the vertex without intravenous contrast. RADIATION DOSE REDUCTION: This exam was performed according to the departmental dose-optimization program which includes automated exposure control, adjustment of the mA and/or kV according to patient size and/or use of iterative reconstruction technique. COMPARISON:  MR brain done on 07/23/2021 FINDINGS: Brain: No acute intracranial findings are seen in noncontrast CT brain. There are no signs of bleeding within the cranium. Ventricles are not dilated. Cortical sulci are prominent. There is decreased density in periventricular white matter. Vascular: Unremarkable. Skull: There are  multiple lytic lesions of varying sizes scattered throughout calvarium. There is interval increase in number and size of the lytic lesions since 07/23/2021. Sinuses/Orbits: There are no air-fluid levels in paranasal sinuses. No focal abnormalities are seen in orbits. Other: None. IMPRESSION: No acute intracranial findings are seen in noncontrast CT brain. Atrophy. There are multiple lytic lesions of varying sizes scattered throughout calvarium suggesting skeletal metastatic disease with interval worsening. Electronically Signed   By: Elmer Picker M.D.   On: 12/31/2021 17:07   DG Elbow Complete Right  Result Date: 12/31/2021 CLINICAL DATA:  Fall EXAM: RIGHT ELBOW - COMPLETE 3+ VIEW COMPARISON:  None Available. FINDINGS: No acute  fracture or malalignment.  No significant elbow effusion. IMPRESSION: No acute osseous abnormality Electronically Signed   By: Donavan Foil M.D.   On: 12/31/2021 16:34   DG Elbow Complete Left  Result Date: 12/31/2021 CLINICAL DATA:  Fall EXAM: LEFT ELBOW - COMPLETE 3+ VIEW COMPARISON:  None Available. FINDINGS: No fracture or malalignment.  No significant elbow effusion IMPRESSION: No acute osseous abnormality Electronically Signed   By: Donavan Foil M.D.   On: 12/31/2021 16:33   DG Chest Port 1 View  Result Date: 12/31/2021 CLINICAL DATA:  Fall. EXAM: PORTABLE CHEST 1 VIEW COMPARISON:  June 30, 2015. FINDINGS: Moderate to large right pleural effusion is noted with associated right basilar atelectasis or infiltrate. Left lung is clear. Visualized portion of cardiomediastinal silhouette is unremarkable. Patient is rotated to the right. Visualized bony thorax is unremarkable. IMPRESSION: Moderate to large right pleural effusion is noted with associated right basilar atelectasis or infiltrate. Electronically Signed   By: Marijo Conception M.D.   On: 12/31/2021 16:33    Procedures Procedures    Medications Ordered in ED Medications  0.9 %  sodium chloride infusion (  Intravenous Not Given 12/31/21 1837)  sodium chloride 0.9 % bolus 1,000 mL (0 mLs Intravenous Stopped 12/31/21 1835)    ED Course/ Medical Decision Making/ A&P                           Medical Decision Making Amount and/or Complexity of Data Reviewed Labs: ordered. Radiology: ordered.  Risk Prescription drug management.   Patient's labs CBC white count 5.9 hemoglobin 16.5.  Lipase 53 complete metabolic panel sodium 536 potassium 4.1 blood sugar 116.  Creatinine 1.01 for a GFR of 55 alk phos is elevated to 52 most likely secondary to the metastatic breast cancer known to be to bone.  CT head without any acute intracranial findings.  There are multiple lytic lesions of varying size scattered throughout the calvarium suggesting skeletal mets metastatic disease with interval worsening.  X-ray of the elbow no osseous abnormality on the right and same for the left.  Chest x-ray moderate to large right pleural effusions noted with associated right basilar atelectasis or infiltrate.  Patient does not have a fever does not have leukocytosis.  Patient clinically was very dehydrated.  Patient received IV fluids here.  Patient insist on going home.  Family prefers placement in a nursing facility family does not have power of attorney over her.   Final Clinical Impression(s) / ED Diagnoses Final diagnoses:  Fall, initial encounter  Dehydration  Malignant neoplasm of female breast, unspecified estrogen receptor status, unspecified laterality, unspecified site of breast [facility])    Rx / [place] Orders ED Discharge Orders     None         Fredia Sorrow, MD 12/31/21 [phone]

## 2021-12-31 NOTE — ED Notes (Signed)
Patient repeatedly yelling out for family to come get here. States "I want to go home and die if I'm gonna die today, not here." Patient requires frequent reminders that she has to remain in the bed and cannot get out of bed.

## 2022-01-03 ENCOUNTER — Inpatient Hospital Stay (HOSPITAL_COMMUNITY)
Admission: EM | Admit: 2022-01-03 | Discharge: 2022-01-07 | DRG: 082 | Disposition: A | Discharge status: Hospice | Payer: Medicare Other | Attending: Family Medicine | Admitting: Family Medicine

## 2022-01-03 ENCOUNTER — Emergency Department (HOSPITAL_COMMUNITY): Payer: Medicare Other

## 2022-01-03 ENCOUNTER — Encounter (HOSPITAL_COMMUNITY): Payer: Self-pay

## 2022-01-03 ENCOUNTER — Other Ambulatory Visit: Payer: Self-pay

## 2022-01-03 DIAGNOSIS — Z9011 Acquired absence of right breast and nipple: Secondary | ICD-10-CM

## 2022-01-03 DIAGNOSIS — K5900 Constipation, unspecified: Secondary | ICD-10-CM | POA: Diagnosis present

## 2022-01-03 DIAGNOSIS — I609 Nontraumatic subarachnoid hemorrhage, unspecified: Secondary | ICD-10-CM

## 2022-01-03 DIAGNOSIS — R627 Adult failure to thrive: Secondary | ICD-10-CM | POA: Diagnosis present

## 2022-01-03 DIAGNOSIS — I6201 Nontraumatic acute subdural hemorrhage: Secondary | ICD-10-CM

## 2022-01-03 DIAGNOSIS — C50919 Malignant neoplasm of unspecified site of unspecified female breast: Secondary | ICD-10-CM

## 2022-01-03 DIAGNOSIS — Z888 Allergy status to other drugs, medicaments and biological substances status: Secondary | ICD-10-CM

## 2022-01-03 DIAGNOSIS — S065XAA Traumatic subdural hemorrhage with loss of consciousness status unknown, initial encounter: Principal | ICD-10-CM

## 2022-01-03 DIAGNOSIS — Z515 Encounter for palliative care: Secondary | ICD-10-CM

## 2022-01-03 DIAGNOSIS — Z79899 Other long term (current) drug therapy: Secondary | ICD-10-CM

## 2022-01-03 DIAGNOSIS — Z681 Body mass index (BMI) 19 or less, adult: Secondary | ICD-10-CM

## 2022-01-03 DIAGNOSIS — Z881 Allergy status to other antibiotic agents status: Secondary | ICD-10-CM

## 2022-01-03 DIAGNOSIS — J9601 Acute respiratory failure with hypoxia: Secondary | ICD-10-CM | POA: Diagnosis present

## 2022-01-03 DIAGNOSIS — Z880 Allergy status to penicillin: Secondary | ICD-10-CM

## 2022-01-03 DIAGNOSIS — R296 Repeated falls: Secondary | ICD-10-CM | POA: Diagnosis present

## 2022-01-03 DIAGNOSIS — W19XXXA Unspecified fall, initial encounter: Secondary | ICD-10-CM | POA: Diagnosis present

## 2022-01-03 DIAGNOSIS — Y92009 Unspecified place in unspecified non-institutional (private) residence as the place of occurrence of the external cause: Secondary | ICD-10-CM

## 2022-01-03 DIAGNOSIS — F419 Anxiety disorder, unspecified: Secondary | ICD-10-CM | POA: Diagnosis present

## 2022-01-03 DIAGNOSIS — S066XAA Traumatic subarachnoid hemorrhage with loss of consciousness status unknown, initial encounter: Secondary | ICD-10-CM | POA: Diagnosis present

## 2022-01-03 DIAGNOSIS — M810 Age-related osteoporosis without current pathological fracture: Secondary | ICD-10-CM | POA: Diagnosis present

## 2022-01-03 DIAGNOSIS — Z882 Allergy status to sulfonamides status: Secondary | ICD-10-CM

## 2022-01-03 DIAGNOSIS — Z8249 Family history of ischemic heart disease and other diseases of the circulatory system: Secondary | ICD-10-CM

## 2022-01-03 DIAGNOSIS — J90 Pleural effusion, not elsewhere classified: Secondary | ICD-10-CM

## 2022-01-03 DIAGNOSIS — E785 Hyperlipidemia, unspecified: Secondary | ICD-10-CM | POA: Diagnosis present

## 2022-01-03 DIAGNOSIS — C7951 Secondary malignant neoplasm of bone: Secondary | ICD-10-CM | POA: Diagnosis present

## 2022-01-03 DIAGNOSIS — Z66 Do not resuscitate: Secondary | ICD-10-CM | POA: Diagnosis present

## 2022-01-03 NOTE — ED Notes (Signed)
Patient states she does not want to be here.  She wants to go home to die.  She reports she lives alone.  Her spouse passed 22 years ago.  She has one son.  Attempt to call son, no answer, left voicemail.  Patient has noted new contusions to the right forehead and right upper arm, skin tear to the right hand.  Old skins tears to both legs.

## 2022-01-03 NOTE — ED Triage Notes (Signed)
[facility] fall today and yesterday x3. Being seen for cancer on spine back stomach.  Lac to right hand  Abrasion to right forehead.  C collar in place.  Refuses pain meds for cancer only takes ativan and tylenol.

## 2022-01-03 NOTE — ED Notes (Signed)
[facility] fall today and yesterday x3. Being seen for cancer on spine back stomach.  Lac to right hand  Abrasion to right forehead.  C collar in place.  Refuses pain meds for cancer only takes ativan and tylenol.

## 2022-01-03 NOTE — ED Notes (Signed)
Call [place] [facility] [phone]

## 2022-01-04 ENCOUNTER — Telehealth: Payer: Self-pay

## 2022-01-04 DIAGNOSIS — I6201 Nontraumatic acute subdural hemorrhage: Secondary | ICD-10-CM

## 2022-01-04 DIAGNOSIS — S065XAA Traumatic subdural hemorrhage with loss of consciousness status unknown, initial encounter: Principal | ICD-10-CM

## 2022-01-04 DIAGNOSIS — I609 Nontraumatic subarachnoid hemorrhage, unspecified: Secondary | ICD-10-CM

## 2022-01-04 DIAGNOSIS — C7951 Secondary malignant neoplasm of bone: Secondary | ICD-10-CM

## 2022-01-04 DIAGNOSIS — J90 Pleural effusion, not elsewhere classified: Secondary | ICD-10-CM

## 2022-01-04 DIAGNOSIS — C50919 Malignant neoplasm of unspecified site of unspecified female breast: Secondary | ICD-10-CM

## 2022-01-04 DIAGNOSIS — Z7189 Other specified counseling: Secondary | ICD-10-CM | POA: Diagnosis not present

## 2022-01-04 DIAGNOSIS — Z515 Encounter for palliative care: Secondary | ICD-10-CM | POA: Diagnosis not present

## 2022-01-04 DIAGNOSIS — W19XXXA Unspecified fall, initial encounter: Secondary | ICD-10-CM

## 2022-01-04 DIAGNOSIS — R296 Repeated falls: Secondary | ICD-10-CM

## 2022-01-04 DIAGNOSIS — I6203 Nontraumatic chronic subdural hemorrhage: Secondary | ICD-10-CM

## 2022-01-04 LAB — CBC WITH DIFFERENTIAL/PLATELET
Abs Immature Granulocytes: 0 10*3/uL (ref 0.00–0.07)
Basophils Absolute: 0 10*3/uL (ref 0.0–0.1)
Basophils Relative: 0 %
Eosinophils Absolute: 0 10*3/uL (ref 0.0–0.5)
Eosinophils Relative: 0 %
HCT: 52.4 % — ABNORMAL HIGH (ref 36.0–46.0)
Hemoglobin: 18 g/dL — ABNORMAL HIGH (ref 12.0–15.0)
Lymphocytes Relative: 11 %
Lymphs Abs: 1 10*3/uL (ref 0.7–4.0)
MCH: 31.1 pg (ref 26.0–34.0)
MCHC: 34.4 g/dL (ref 30.0–36.0)
MCV: 90.5 fL (ref 80.0–100.0)
Monocytes Absolute: 0.8 10*3/uL (ref 0.1–1.0)
Monocytes Relative: 9 %
Neutro Abs: 7 10*3/uL (ref 1.7–7.7)
Neutrophils Relative %: 80 %
Platelets: 258 10*3/uL (ref 150–400)
RBC: 5.79 MIL/uL — ABNORMAL HIGH (ref 3.87–5.11)
RDW: 14.5 % (ref 11.5–15.5)
WBC: 8.8 10*3/uL (ref 4.0–10.5)
nRBC: 0 % (ref 0.0–0.2)

## 2022-01-04 LAB — BASIC METABOLIC PANEL
Anion gap: 17 — ABNORMAL HIGH (ref 5–15)
BUN: 22 mg/dL (ref 8–23)
CO2: 22 mmol/L (ref 22–32)
Calcium: 9.9 mg/dL (ref 8.9–10.3)
Chloride: 95 mmol/L — ABNORMAL LOW (ref 98–111)
Creatinine, Ser: 1.08 mg/dL — ABNORMAL HIGH (ref 0.44–1.00)
GFR, Estimated: 51 mL/min — ABNORMAL LOW (ref >60)
Glucose, Bld: 116 mg/dL — ABNORMAL HIGH (ref 70–99)
Potassium: 3.6 mmol/L (ref 3.5–5.1)
Sodium: 134 mmol/L — ABNORMAL LOW (ref 135–145)

## 2022-01-04 MED ORDER — ONDANSETRON HCL 4 MG/2ML IJ SOLN: 4.0000 mg | Freq: Three times a day (TID) | INTRAMUSCULAR | Status: DC | PRN

## 2022-01-04 MED ORDER — LORAZEPAM 1 MG PO TABS
1.0000 mg | ORAL_TABLET | Freq: Four times a day (QID) | ORAL | Status: DC
  Administered 2022-01-04 – 2022-01-06 (×6): 1 mg via SUBLINGUAL
  Filled 2022-01-04 (×9): qty 1

## 2022-01-04 MED ORDER — MORPHINE SULFATE (CONCENTRATE) 10 MG/0.5ML PO SOLN
5.0000 mg | ORAL | Status: DC | PRN
  Administered 2022-01-05 – 2022-01-06 (×2): 5 mg via SUBLINGUAL
  Filled 2022-01-04 (×2): qty 0.5

## 2022-01-04 MED ORDER — ACETAMINOPHEN 650 MG RE SUPP: 650.0000 mg | Freq: Four times a day (QID) | RECTAL | Status: DC | PRN

## 2022-01-04 MED ORDER — LORAZEPAM 2 MG/ML PO CONC: 1.0000 mg | Freq: Four times a day (QID) | ORAL | Status: DC

## 2022-01-04 MED ORDER — LORAZEPAM 2 MG/ML IJ SOLN: 1.0000 mg | Freq: Four times a day (QID) | INTRAMUSCULAR | Status: DC

## 2022-01-04 MED ORDER — LORAZEPAM 1 MG PO TABS: 1.0000 mg | ORAL_TABLET | Freq: Four times a day (QID) | ORAL | Status: DC

## 2022-01-04 MED ORDER — LORAZEPAM 1 MG PO TABS: 1.0000 mg | ORAL_TABLET | ORAL | Status: DC | PRN

## 2022-01-04 MED ORDER — LORAZEPAM 1 MG PO TABS: 1.0000 mg | ORAL_TABLET | ORAL | Status: DC

## 2022-01-04 MED ORDER — LORAZEPAM 1 MG PO TABS
1.0000 mg | ORAL_TABLET | ORAL | Status: DC | PRN
  Administered 2022-01-05 – 2022-01-06 (×2): 1 mg via SUBLINGUAL
  Filled 2022-01-04 (×2): qty 1

## 2022-01-04 MED ORDER — OXYCODONE HCL 5 MG PO TABS: 2.5000 mg | ORAL_TABLET | ORAL | Status: DC | PRN

## 2022-01-04 MED ORDER — MORPHINE SULFATE (CONCENTRATE) 10 MG/0.5ML PO SOLN: 5.0000 mg | ORAL | Status: DC | PRN

## 2022-01-04 MED ORDER — ACETAMINOPHEN 500 MG PO TABS: 1000.0000 mg | ORAL_TABLET | Freq: Three times a day (TID) | ORAL | Status: DC

## 2022-01-04 MED ORDER — HYDROMORPHONE HCL 1 MG/ML IJ SOLN
0.2500 mg | INTRAMUSCULAR | Status: DC | PRN
  Administered 2022-01-04: 0.25 mg via SUBCUTANEOUS
  Filled 2022-01-04: qty 0.5

## 2022-01-04 MED ORDER — ACETAMINOPHEN 500 MG PO TABS: 1000.0000 mg | ORAL_TABLET | Freq: Three times a day (TID) | ORAL | Status: DC | PRN

## 2022-01-04 MED ORDER — HYDROMORPHONE HCL 1 MG/ML IJ SOLN: 0.2500 mg | INTRAMUSCULAR | Status: DC | PRN

## 2022-01-04 MED ORDER — LORAZEPAM 2 MG/ML PO CONC: 1.0000 mg | ORAL | Status: DC | PRN

## 2022-01-04 NOTE — ED Provider Notes (Signed)
7:05 AM Assumed care of patient from off-going team. For more details, please see note from same day.  In brief, this is a 86 y.o. female with h/o metastatic breast cancer who p/w fall.  Scans show BL subdurals and subarachnoid.  Hospitalist felt placement more appropriate. Patient was resistant to hospice.  Plan/Dispo at time of sign-out & ED Course since sign-out: TOC for placement.  BP 111/86   Pulse (!) 107   Temp (!) 97.1 F (36.2 C) (Temporal)   Resp (!) 27   Ht '5\' 3"'$  (1.6 m)   Wt 52 kg   SpO2 (!) 89%   BMI 20.31 kg/m    ED Course:     Clinical Course as of 01/05/22 1543  Tue Jan 04, 2022  1425 Per social work, unlikely that patient will be placed with hospice today. More likely tomorrow. [HN]    Clinical Course User Index [HN] Audley Hose, MD    Dispo: boarding for hospice placement ------------------------------- Cindee Lame, MD Emergency Medicine  This note was created using dictation software, which may contain spelling or grammatical errors.   Audley Hose, MD 01/05/22 [phone]

## 2022-01-04 NOTE — Consult Note (Signed)
Initial Consultation Note   Patient: Caitlin Alexander YFV:[id number] DOB: February 18, 1936 PCP: Asencion Noble, MD DOA: 01/03/2022 DOS: the patient was seen and examined on 01/04/2022 Primary service: Veryl Speak, MD  Referring physician: Dr. Stark Jock Reason for consult: Subarachnoid hemorrhage  Assessment/Plan: Assessment and Plan: No notes have been filed under this hospital service. Service: Hospitalist  Bilateral acute on chronic subdural hematoma Left temporal acute subarachnoid hemorrhage Fall at home Right pleural effusion  Patient was not a surgical candidate per neurosurgery.  Oncologist already signed off on patient per ED medical record.  Patient does not want any intervention, states that she just wanted to die at home.  Regarding the pleural effusion, ED doctor spoke with family who states to do nothing regarding the effusion. I agree with consult for TOC to arrange for placement  TR[facility] will sign off at present, please call us again when needed.  HPI: Caitlin Alexander is a 86 y.o. female with past medical history of  Patient is an 86 year old female with metastatic breast cancer with a longer receiving therapy and Dr. Raliegh Ip.  already signed off on her as patient is supposed to be on hospice care per ED medical record.  Patient was recently seen in the ED about 3 days ago due to falls and she now presents again to the ED due to fall with hitting of her head.   CT of head done showed bilateral acute on chronic subdural hematoma and left temporal acute subarachnoid hemorrhage.  Neurosurgery (Dr. Venetia Constable) was consulted and states that patient is not a surgical candidate based on clinical situation.  Patient also does not want any intervention and stated that she wanted to die at home.  Patient has refused home health care and family's intention was to look for placement for patient since it has become challenging for them to take care of her at home. Since no medical treatment was being provided and  plan was to consult TOC for placement in the morning, it was deemed that it would not be of patient's benefit to be admitted just for few hours and to go to a SNF, since this could be arranged directly from the ED.  This was discussed with the [facility] to ensure that patient could be admitted to a nursing facility from the ED and it was confirmed that it was so. However, if needed, TRH will be available to provide any assistance required prior to patient being transferred to nursing home while still in the ED.  Patient denies chest pain, shortness of breath, nausea, vomiting.  In the emergency department, she was tachypneic, tachycardic, BP was 124/91.  Work-up in the ED showed H/H 18.0/52.4, MCV 90.5, platelets 258.  Showed sodium of 134, potassium 3.2, chloride 95, bicarb 22, glucose 116, BUN 22, creatinine 1.08 CT of head and CT cervical spine without contrast showed 1. Bilateral acute on chronic subdural hematomas. Left temporal acute subarachnoid hemorrhage. 2. Diffuse skeletal metastases involving the skull, cervical spine, and ribs. 3. Large right pleural effusion, increasing since prior PET-CT from 07/01/2021    Review of Systems: As mentioned in the history of present illness. All other systems reviewed and are negative. Past Medical History:  Diagnosis Date   Anxiety    Arthritis    Breast cancer (Caitlin Alexander) 2018   Cancer [facility])    breast cancer   Concussion    hit by a softball on left side of head   Hyperlipidemia    Osteoporosis    Pericarditis 1961  PONV (postoperative nausea and vomiting)    Thyroiditis 1971   Past Surgical History:  Procedure Laterality Date   ABDOMINAL HYSTERECTOMY  1985   ABDOMINAL SURGERY     APPENDECTOMY     BOWEL RESECTION  10/31/2011   Procedure: SMALL BOWEL RESECTION;  Surgeon: Donato Heinz, MD;  Location: [facility];  Service: General;;   BREAST LUMPECTOMY Right    BREAST LUMPECTOMY WITH RADIOACTIVE SEED LOCALIZATION Right 02/07/2017   Procedure: BREAST  LUMPECTOMY WITH RADIOACTIVE SEED LOCALIZATION;  Surgeon: Rolm Bookbinder, MD;  Location: [place];  Service: General;  Laterality: Right;   BREAST LUMPECTOMY WITH RADIOACTIVE SEED LOCALIZATION Right 02/27/2018   Procedure: RIGHT BREAST LUMPECTOMY WITH RADIOACTIVE SEED LOCALIZATION;  Surgeon: Rolm Bookbinder, MD;  Location: [place];  Service: General;  Laterality: Right;   FISSURECTOMY     LAPAROSCOPIC DISTAL PANCREATECTOMY  2009   and splenectomy   LAPAROTOMY  10/31/2011   Procedure: EXPLORATORY LAPAROTOMY;  Surgeon: Donato Heinz, MD;  Location: [facility];  Service: General;  Laterality: N/A;   MASTECTOMY Right 2021   NASAL SEPTUM SURGERY  1990   NASAL SINUS SURGERY     SIMPLE MASTECTOMY WITH AXILLARY SENTINEL NODE BIOPSY Right 06/11/2019   Procedure: RIGHT MASTECTOMY;  Surgeon: Rolm Bookbinder, MD;  Location: [place];  Service: General;  Laterality: Right;   TUBAL LIGATION     Social History:  reports that she has never smoked. She has never used smokeless tobacco. She reports that she does not drink alcohol and does not use drugs.  Allergies  Allergen Reactions   Macrodantin [Nitrofurantoin Macrocrystal] Other (See Comments)    Unknown   Nystatin     Pt can't remember what the allergy was    Promethazine Other (See Comments)    Hallucinations   Sulfa Antibiotics Other (See Comments)    Whelps   Suprax [Cefixime] Other (See Comments)    Unknown   Penicillins Rash    Did it involve swelling of the face/tongue/throat, SOB, or low BP? Unknown Did it involve sudden or severe rash/hives, skin peeling, or any reaction on the inside of your mouth or nose? No Did you need to seek medical attention at a hospital or doctor's office? Unknown When did it last happen?      unknown If all above answers are "NO", may proceed with cephalosporin use.     Family History  Problem Relation Age of Onset   Diabetes Brother    Hypertension Son    Ulcers Sister     Prior to Admission medications    Medication Sig Start Date End Date Taking? Authorizing Provider  Acetaminophen (TYLENOL PO) Take by mouth.    [provider]  lidocaine (LIDODERM) 5 % Place 1 patch onto the skin daily. Remove & Discard patch within 12 hours or as directed by MD 09/05/21   Luna Fuse, MD  LORazepam (ATIVAN) 1 MG tablet Take 1 mg by mouth 3 (three) times daily.     [provider]  polyethylene glycol (MIRALAX / GLYCOLAX) packet Take 17 g by mouth daily.    [provider]    Physical Exam: Vitals:   01/03/22 2230 01/04/22 0000 01/04/22 0130 01/04/22 0142  BP:  108/89 (!) 111/99   Pulse:  (!) 114 (!) 106   Resp:  (!) 30 (!) 26   Temp:    (!) 97.4 F (36.3 C)  TempSrc:    Oral  SpO2: 97% 91% (!) 89%   Weight:  Height:        Gen:- Awake Alert, and oriented x3 HEENT:- [place].AT, No sclera icterus Neck-Supple Neck,No JVD,.  Lungs-  CTAB, no Rales CV- S1, S2 normal Abd-  +ve B.Sounds, Abd Soft, No tenderness,    Extremity/Skin:- No  edema,   warm and dry Psych-affect is appropriate, oriented x3 Neuro-no new focal deficits, no tremors  Data Reviewed:  There are no new results to review at this time.   Family Communication: None at bedside Primary team communication: Communicated with the ED physician Thank you very much for involving [place] in the care of your patient.  Author: Bernadette Hoit, DO 01/04/2022 2:08 AM  For on call review [email or web address].

## 2022-01-04 NOTE — ED Provider Notes (Addendum)
[facility] EMERGENCY DEPARTMENT Provider Note   CSN: [id number] Arrival date & time: 01/03/22  1935     History  Chief Complaint  Patient presents with   Caitlin Alexander is a 86 y.o. female.  Patient is a an 86 year old female with history of metastatic breast cancer diagnosed last September.  Patient is brought here for evaluation of a fall.  Patient apparently fell earlier this evening and struck her head.  She fell getting off of the toilet.  According to the daughter-in-law, patient has been very unsteady and this is her second fall in the past several days.  She tells me they can no longer take care of her due to her increased acuity of care.  Patient denies to me she is experiencing any pain.  She denies headache or neck pain.  The history is provided by the patient.       Home Medications Prior to Admission medications   Medication Sig Start Date End Date Taking? Authorizing Provider  Acetaminophen (TYLENOL PO) Take by mouth.    [provider]  lidocaine (LIDODERM) 5 % Place 1 patch onto the skin daily. Remove & Discard patch within 12 hours or as directed by MD 09/05/21   Luna Fuse, MD  LORazepam (ATIVAN) 1 MG tablet Take 1 mg by mouth 3 (three) times daily.     [provider]  polyethylene glycol (MIRALAX / GLYCOLAX) packet Take 17 g by mouth daily.    [provider]      Allergies    Macrodantin [nitrofurantoin macrocrystal], Nystatin, Promethazine, Sulfa antibiotics, Suprax [cefixime], and Penicillins    Review of Systems   Review of Systems  All other systems reviewed and are negative.   Physical Exam Updated Vital Signs BP (!) 120/92   Pulse (!) 112   Temp (!) 97.1 F (36.2 C) (Temporal)   Resp (!) 27   Ht '5\' 3"'$  (1.6 m)   Wt 52 kg   SpO2 97%   BMI 20.31 kg/m  Physical Exam Vitals and nursing note reviewed.  Constitutional:      General: She is not in acute distress.    Appearance: She is well-developed.  She is not diaphoretic.     Comments: Patient is a cachectic, chronically ill-appearing elderly female.  She is in no acute distress.  HENT:     Head: Normocephalic and atraumatic.  Cardiovascular:     Rate and Rhythm: Normal rate and regular rhythm.     Heart sounds: No murmur heard.    No friction rub. No gallop.  Pulmonary:     Effort: Pulmonary effort is normal. No respiratory distress.     Breath sounds: Normal breath sounds. No wheezing.  Abdominal:     General: Bowel sounds are normal. There is no distension.     Palpations: Abdomen is soft.     Tenderness: There is no abdominal tenderness.  Musculoskeletal:        General: Normal range of motion.     Cervical back: Normal range of motion and neck supple.  Skin:    General: Skin is warm and dry.  Neurological:     General: No focal deficit present.     Mental Status: She is alert and oriented to person, place, and time.     ED Results / Procedures / Treatments   Labs (all labs ordered are listed, but only abnormal results are displayed) Labs Reviewed - No data to display  EKG None  Radiology CT Head Wo Contrast  Result Date: 01/04/2022 CLINICAL DATA:  Blunt facial trauma. [place] today and yesterday x3. History of metastatic cancer. EXAM: CT HEAD WITHOUT CONTRAST CT CERVICAL SPINE WITHOUT CONTRAST TECHNIQUE: Multidetector CT imaging of the head and cervical spine was performed following the standard protocol without intravenous contrast. Multiplanar CT image reconstructions of the cervical spine were also generated. RADIATION DOSE REDUCTION: This exam was performed according to the departmental dose-optimization program which includes automated exposure control, adjustment of the mA and/or kV according to patient size and/or use of iterative reconstruction technique. COMPARISON:  None Available. FINDINGS: CT HEAD FINDINGS Brain: Bilateral subdural hematomas with mixed density appearance consistent with acute on chronic  subdural hematomas. Right subdural hematoma measures 5 mm depth. Left subdural measures 5 mm depth. Left temporal subarachnoid hemorrhage. Diffuse cerebral atrophy. Ventricular dilatation consistent with central atrophy. Low-attenuation changes in the deep white matter consistent small vessel ischemia. No midline shift. Vascular: No hyperdense vessel or unexpected calcification. Skull: Multiple lucent and destructive bone lesions throughout the calvarium consistent with metastasis. Sinuses/Orbits: Paranasal sinuses are clear. Bilateral mastoid effusions. Other: None. CT CERVICAL SPINE FINDINGS Alignment: Normal alignment. Skull base and vertebrae: Destructive bone lesions demonstrated in the skull base and in multiple vertebra involving vertebral body and posterior elements. No vertebral compression deformities. Soft tissues and spinal canal: No prevertebral soft tissue swelling. No paraspinal soft tissue mass lesions are identified. Disc levels:  Intervertebral disc space heights are mostly normal. Upper chest: Large right pleural effusion. Multiple destructive rib bone lesions. Other: None. IMPRESSION: 1. Bilateral acute on chronic subdural hematomas. Left temporal acute subarachnoid hemorrhage. 2. Diffuse skeletal metastases involving the skull, cervical spine, and ribs. 3. Large right pleural effusion, increasing since prior PET-CT from 07/01/2021 Critical Value/emergent results were called by telephone at the time of interpretation on 01/03/2022 at 11:52 pm to provider Veryl Speak , who verbally acknowledged these results. Electronically Signed   By: Lucienne Capers M.D.   On: 01/04/2022 00:02   CT Cervical Spine Wo Contrast  Result Date: 01/04/2022 CLINICAL DATA:  Blunt facial trauma. [place] today and yesterday x3. History of metastatic cancer. EXAM: CT HEAD WITHOUT CONTRAST CT CERVICAL SPINE WITHOUT CONTRAST TECHNIQUE: Multidetector CT imaging of the head and cervical spine was performed following the  standard protocol without intravenous contrast. Multiplanar CT image reconstructions of the cervical spine were also generated. RADIATION DOSE REDUCTION: This exam was performed according to the departmental dose-optimization program which includes automated exposure control, adjustment of the mA and/or kV according to patient size and/or use of iterative reconstruction technique. COMPARISON:  None Available. FINDINGS: CT HEAD FINDINGS Brain: Bilateral subdural hematomas with mixed density appearance consistent with acute on chronic subdural hematomas. Right subdural hematoma measures 5 mm depth. Left subdural measures 5 mm depth. Left temporal subarachnoid hemorrhage. Diffuse cerebral atrophy. Ventricular dilatation consistent with central atrophy. Low-attenuation changes in the deep white matter consistent small vessel ischemia. No midline shift. Vascular: No hyperdense vessel or unexpected calcification. Skull: Multiple lucent and destructive bone lesions throughout the calvarium consistent with metastasis. Sinuses/Orbits: Paranasal sinuses are clear. Bilateral mastoid effusions. Other: None. CT CERVICAL SPINE FINDINGS Alignment: Normal alignment. Skull base and vertebrae: Destructive bone lesions demonstrated in the skull base and in multiple vertebra involving vertebral body and posterior elements. No vertebral compression deformities. Soft tissues and spinal canal: No prevertebral soft tissue swelling. No paraspinal soft tissue mass lesions are identified. Disc levels:  Intervertebral disc space heights are mostly normal.  Upper chest: Large right pleural effusion. Multiple destructive rib bone lesions. Other: None. IMPRESSION: 1. Bilateral acute on chronic subdural hematomas. Left temporal acute subarachnoid hemorrhage. 2. Diffuse skeletal metastases involving the skull, cervical spine, and ribs. 3. Large right pleural effusion, increasing since prior PET-CT from 07/01/2021 Critical Value/emergent results were  called by telephone at the time of interpretation on 01/03/2022 at 11:52 pm to provider Veryl Speak , who verbally acknowledged these results. Electronically Signed   By: Lucienne Capers M.D.   On: 01/04/2022 00:02    Procedures Procedures    Medications Ordered in ED Medications - No data to display  ED Course/ Medical Decision Making/ A&P  Patient with past medical history of metastatic breast cancer presenting for evaluation of fall.  Patient apparently has been declining drastically in the past several weeks.  She was seen here for a fall 3 days ago, then fell again this evening, this time striking her head.  She arrives here with stable vital signs and is neurologically intact and at her baseline.  Head CT obtained shows bilateral acute on chronic subdural hematomas with a small area of subarachnoid hemorrhage.  This finding was discussed with Dr. Venetia Constable from neurosurgery.  He does not feel as though she is a surgical candidate given the clinical situation.  Patient also tells me that she is "ready to die" and does not want anything to be done.  I had planned to admit the patient for monitoring and consultation with hospice versus placement in an extended care facility, however Dr. Josephine Cables does not feel as though patient meets inpatient criteria and that these arrangements can be made from the emergency department.  Consult will be placed with TOC and patient to be evaluated by them in the morning.  ADDENDUM: Patient now noted to be somewhat hypoxic and CT of the neck shows a large pleural effusion.  I did discuss the situation with the patient's daughter-in-law who is adamant that no additional procedures be performed and that we adhere to comfort measures.  Final Clinical Impression(s) / ED Diagnoses Final diagnoses:  None    Rx / DC Orders ED Discharge Orders     None         Veryl Speak, MD 01/04/22 Sylvester Harder    Veryl Speak, MD 01/04/22 [phone]

## 2022-01-04 NOTE — Telephone Encounter (Signed)
     Patient  visit on 10/6  at [facility] was Have you been able to follow up with your primary care physician? yes  The patient was or was not able to obtain any needed medicine or equipment. yes  Are there diet recommendations that you are having difficulty following? na  Patient expresses understanding of discharge instructions and education provided has no other needs at this time. yes     [place], Care Management  [phone] [address], [place], [place] [address] Phone: [phone] Email: [email or web address]

## 2022-01-04 NOTE — ED Notes (Signed)
CSW updated by palliative NP that family is requesting referral to [facility] for [facility] placement. CSW spoke to [facility] to provide referral. Marchia Meiers states they will try to send someone out today but it may be tomorrow morning before they are able to get someone to the hospital to assess pt. TOC to follow.

## 2022-01-04 NOTE — ED Notes (Signed)
Patient has been ordered to be on comfort measures only. Her vitals have been ordered q shift. Patient is confused and HOH. Pt is unable to understand why or remember to leave [place] in her nose. PM RN reported it is unsafe for pt to take anything by mouth d/t coughing and risk of aspiration. PM RN requested a SLP evaluation. This RN has made palliative and attending aware.

## 2022-01-04 NOTE — ED Provider Notes (Signed)
  Physical Exam  BP 107/72   Pulse 91   Temp (!) 96.3 F (35.7 C) (Rectal)   Resp (!) 23   Ht '5\' 3"'$  (1.6 m)   Wt 52 kg   SpO2 (!) 89%   BMI 20.31 kg/m   Physical Exam  Procedures  Procedures  ED Course / MDM   Clinical Course as of 01/04/22 1642  Tue Jan 04, 2022  1425 Per social work, unlikely that patient will be placed with hospice today. More likely tomorrow. [HN]    Clinical Course User Index [HN] Audley Hose, MD   Medical Decision Making Amount and/or Complexity of Data Reviewed Labs: ordered. Radiology: ordered.  Risk Prescription drug management.   Pt with cancer dz hx now noted to have SAH/SDH - traumatic. Transitioning to hospice.  TOC involved. Awaiting transfer.      Varney Biles, MD 01/04/22 [phone]

## 2022-01-04 NOTE — ED Notes (Signed)
Patient continues to remove the oxygen.  She has been encouraged to leave the nasal cannula in place.

## 2022-01-04 NOTE — Consult Note (Addendum)
Consultation Note Date: 01/04/2022   Patient Name: Caitlin Alexander  DOB: Mar 24, 1936  MRN: [id number]  Age / Sex: 86 y.o., female  PCP: Asencion Noble, MD Referring Physician: Audley Hose, MD  Reason for Consultation: Establishing goals of care  HPI/Patient Profile: 86 y.o. female  with past medical history of breast cancer with extensive bone mets admitted on 01/03/2022 with frequent falls and found to have bilateral SDH.   Clinical Assessment and Goals of Care: I have reviewed records from current ED stay including diagnostics and outpatient records. I met today with Caitlin Alexander and her son and daughter-in-law, Caitlin Alexander and Caitlin Alexander, at bedside. Caitlin Alexander is awake and oriented to person, place, month/year but not to situation. She understands that she has progressing cancer and her poor prognosis. She expresses multiple times that she is "ready to die" and noting "I know where I am going." She does not fear death. She becomes fixated on explaining her diagnosis of cancer and cancer in her bones. She refuses pain medication but appears uncomfortable. She tells me that she takes Ativan (family reports she has taken this for 20+ years) and this helps with her pain. She is fixated on returning home and says that she can take care of herself. She says she is safe to walk - when I question this by discussing her fall and bilateral SDH she only keeps repeating that she can walk and be fine at home. She insists that she can cook and mow her lawn - family report that she has been unable to do this for months. They say she has had 5 falls in the past 2 weeks. They have been struggling to care for her and are requesting placement. They prefer hospice to continue comfort measures. They do not desire work up or treatment but comfort.   Family report that Caitlin Alexander had hospice briefly but asked them not to return. Caitlin Alexander shares that his mother never wants  anyone in her home and she has always been that way. Sounds like even family are limited on how much support they can provide her at home. Certainly it is unsafe for her to return back home. She lacks insight and understanding of consequences/risks of what returning home will mean for her. She insists on returning home but unable to explain why or what can happen if she were to return home. Unable to provide reasoning of not wanting to go anywhere other than home. She is consistent in her expressed wishes but has unrealistic expectations of her functional status and abilities at this stage of her illness. I do not feel she has capacity to make the decision to return home. Nursing notes also support periods of confusion and forgetfulness with removing oxygen in their notes.   Family at bedside would like to pursue facility placement and would prefer hospice placement. She has shown progression towards end of life. She refuses medication except for Ativan but does desire comfort care and is accepting of end of life. With the goal of comfort, poor intake,  and now with concern for aspiration (chocking even with small sip of water per nursing) I feel she may be nearing appropriateness of hospice facility. With fall and SDH this will likely trigger further progression of decline with prognosis likely weeks or less with new severe aspiration. This is new and family desire comfort feeds and not to withhold foods/drinks she desires accepting risk of aspiration and that this is a sign of quicker progression towards end of life.   All questions/concerns addressed. Emotional support provided. Discussed with CSW.   Primary Decision Maker NEXT OF KIN son and daughter-in-law    SUMMARY OF RECOMMENDATIONS   - DNR - Comfort measures - Consider transition to hospice facility  Code Status/Advance Care Planning: DNR   Symptom Management:  PRN medications (sublingual due to aspiration risk). Scheduled Ativan as she  takes 1 mg po every ~3 hours during daytime per family.   Palliative Prophylaxis:  Aspiration, Bowel Regimen, Delirium Protocol, Frequent Pain Assessment, Oral Care, and Turn Reposition  Additional Recommendations (Limitations, Scope, Preferences): Full Comfort Care  Psycho-social/Spiritual:  Desire for further Chaplaincy support:yes Additional Recommendations: Education on Hospice  Prognosis:  Likely weeks now with aspiration risk after fall with head injury.   Discharge Planning: Hospice facility recommended     Primary Diagnoses: Present on Admission: **None**   I have reviewed the medical record, interviewed the patient and family, and examined the patient. The following aspects are pertinent.  Past Medical History:  Diagnosis Date   Anxiety    Arthritis    Breast cancer ([place]) 2018   Cancer ([place])    breast cancer   Concussion    hit by a softball on left side of head   Hyperlipidemia    Osteoporosis    Pericarditis 1961   PONV (postoperative nausea and vomiting)    Thyroiditis 1971   Social History   Socioeconomic History   Marital status: Widowed    Spouse name: Not on file   Number of children: Not on file   Years of education: Not on file   Highest education level: Not on file  Occupational History   Not on file  Tobacco Use   Smoking status: Never   Smokeless tobacco: Never  Vaping Use   Vaping Use: Never used  Substance and Sexual Activity   Alcohol use: No   Drug use: No   Sexual activity: Never  Other Topics Concern   Not on file  Social History Narrative   Not on file   Social Determinants of Health   Financial Resource Strain: Not on file  Food Insecurity: Not on file  Transportation Needs: No Transportation Needs (11/12/2021)   PRAPARE - Hydrologist (Medical): No    Lack of Transportation (Non-Medical): No  Physical Activity: Not on file  Stress: Not on file  Social Connections: Not on file   Family  History  Problem Relation Age of Onset   Diabetes Brother    Hypertension Son    Ulcers Sister    Scheduled Meds:  LORazepam  1-2 mg Oral QID   Continuous Infusions: PRN Meds:.acetaminophen, HYDROmorphone (DILAUDID) injection, LORazepam, morphine CONCENTRATE, ondansetron Allergies  Allergen Reactions   Macrodantin [Nitrofurantoin Macrocrystal] Other (See Comments)    Unknown   Nystatin     Pt can't remember what the allergy was    Promethazine Other (See Comments)    Hallucinations   Sulfa Antibiotics Other (See Comments)    Whelps   Suprax [Cefixime]  Other (See Comments)    Unknown   Penicillins Rash    Did it involve swelling of the face/tongue/throat, SOB, or low BP? Unknown Did it involve sudden or severe rash/hives, skin peeling, or any reaction on the inside of your mouth or nose? No Did you need to seek medical attention at a hospital or doctor's office? Unknown When did it last happen?      unknown If all above answers are "NO", may proceed with cephalosporin use.    Review of Systems  Constitutional:        Patient denies symptoms and decline but family endorse weakness, fatigue, change in appetite with general decline and failure to thrive    Physical Exam Vitals and nursing note reviewed.  Constitutional:      General: She is not in acute distress.    Appearance: She is ill-appearing.     Comments: Thin, frail  Cardiovascular:     Rate and Rhythm: Normal rate.  Pulmonary:     Effort: No tachypnea, accessory muscle usage or respiratory distress.  Abdominal:     General: Abdomen is flat.     Palpations: Abdomen is soft.  Neurological:     Mental Status: She is alert and oriented to person, place, and time.  Psychiatric:        Behavior: Behavior is hyperactive.        Cognition and Memory: She exhibits impaired recent memory.        Judgment: Judgment is inappropriate.     Comments: Poor insight into consequences of complex decisions     Vital  Signs: BP 107/72   Pulse 91   Temp (!) 97.1 F (36.2 C) (Temporal)   Resp (!) 23   Ht 5' 3"  (1.6 m)   Wt 52 kg   SpO2 (!) 89%   BMI 20.31 kg/m  Pain Scale: 0-10       SpO2: SpO2: (!) 89 % O2 Device:SpO2: (!) 89 % O2 Flow Rate: .   IO: Intake/output summary: No intake or output data in the 24 hours ending 01/04/22 0958  LBM:   Baseline Weight: Weight: 52 kg Most recent weight: Weight: 52 kg     Palliative Assessment/Data:     Time In: 0915  Time Total: 75 min Greater than 50%  of this time was spent counseling and coordinating care related to the above assessment and plan.  Signed by: Vinie Sill, NP Palliative Medicine Team Pager # [phone] (M-F 8a-5p) Team Phone # [phone] (Nights/Weekends)

## 2022-01-04 NOTE — ED Notes (Signed)
Patient is alert.  She is asking repeatative questions regarding why she is here and where are her glasses.  She did not arrive with glasses.  Patient has removed the purewick and she was unable to void on the bedpan.  Patient repositioned in bed.  Patient noted to have difficulty swallowing when offered water.  Will request SL eval

## 2022-01-04 NOTE — H&P (Signed)
History and Physical    Patient: Caitlin Alexander IWP:[id number] DOB: 1935/07/26 DOA: 01/03/2022 DOS: the patient was seen and examined on 01/04/2022 PCP: Asencion Noble, MD  Patient coming from: Home  Chief Complaint:  Chief Complaint  Patient presents with   Fall   HPI: Caitlin Alexander is a 86 y.o. female with medical history significant of metastatic breast cancer who is no longer receiving therapy and Dr. Raliegh Ip.  already signed off on her as patient is supposed to be on hospice care per ED medical record.  Patient was recently seen in the ED about 3 days ago due to falls, she presented to the ED this morning due to fall with hitting of her head.   CT of head done showed bilateral acute on chronic subdural hematoma and left temporal acute subarachnoid hemorrhage.  Neurosurgery (Dr. Venetia Constable) was consulted and states that patient is not a surgical candidate based on clinical situation.  Patient also did not want any intervention and stated that she wanted to die at home.  Patient has refused home health care and family's intention was to look for placement for patient since it has become challenging for them to take care of her at home. TOC was consulted by the ED team this morning regarding referral to hospice of [facility] for [place] placement, but it does not appear that hospice home will be able to get any staff to assess patient at the hospital to tomorrow.  Hospitalist was asked to admit patient pending being able to be discharged to hospice.  ED Course:  In the emergency department, patient was tachycardic, but other vital signs were within normal range, patient was on supplemental oxygen at 5 LPM with O2 sat of 91%.  Work-up in the ED on arrival to the ED showed H/H 18.0/52.4, MCV 90.5, platelets 258.  Showed sodium of 134, potassium 3.2, chloride 95, bicarb 22, glucose 116, BUN 22, creatinine 1.08 CT of head and CT cervical spine without contrast showed 1. Bilateral acute on  chronic subdural hematomas. Left temporal acute subarachnoid hemorrhage. 2. Diffuse skeletal metastases involving the skull, cervical spine, and ribs. 3. Large right pleural effusion, increasing since prior PET-CT from 07/01/2021    Review of Systems: Review of systems as noted in the HPI. All other systems reviewed and are negative.   Past Medical History:  Diagnosis Date   Anxiety    Arthritis    Breast cancer ([place]) 2018   Cancer ([place])    breast cancer   Concussion    hit by a softball on left side of head   Hyperlipidemia    Osteoporosis    Pericarditis 1961   PONV (postoperative nausea and vomiting)    Thyroiditis 1971   Past Surgical History:  Procedure Laterality Date   ABDOMINAL HYSTERECTOMY  1985   ABDOMINAL SURGERY     APPENDECTOMY     BOWEL RESECTION  10/31/2011   Procedure: SMALL BOWEL RESECTION;  Surgeon: Donato Heinz, MD;  Location: [facility];  Service: General;;   BREAST LUMPECTOMY Right    BREAST LUMPECTOMY WITH RADIOACTIVE SEED LOCALIZATION Right 02/07/2017   Procedure: BREAST LUMPECTOMY WITH RADIOACTIVE SEED LOCALIZATION;  Surgeon: Rolm Bookbinder, MD;  Location: [place];  Service: General;  Laterality: Right;   BREAST LUMPECTOMY WITH RADIOACTIVE SEED LOCALIZATION Right 02/27/2018   Procedure: RIGHT BREAST LUMPECTOMY WITH RADIOACTIVE SEED LOCALIZATION;  Surgeon: Rolm Bookbinder, MD;  Location: [place];  Service: General;  Laterality: Right;   FISSURECTOMY  LAPAROSCOPIC DISTAL PANCREATECTOMY  2009   and splenectomy   LAPAROTOMY  10/31/2011   Procedure: EXPLORATORY LAPAROTOMY;  Surgeon: Donato Heinz, MD;  Location: [facility];  Service: General;  Laterality: N/A;   MASTECTOMY Right 2021   NASAL SEPTUM SURGERY  1990   NASAL SINUS SURGERY     SIMPLE MASTECTOMY WITH AXILLARY SENTINEL NODE BIOPSY Right 06/11/2019   Procedure: RIGHT MASTECTOMY;  Surgeon: Rolm Bookbinder, MD;  Location: [place];  Service: General;  Laterality: Right;   TUBAL LIGATION       Social History:  reports that she has never smoked. She has never used smokeless tobacco. She reports that she does not drink alcohol and does not use drugs.   Allergies  Allergen Reactions   Macrodantin [Nitrofurantoin Macrocrystal] Other (See Comments)    Unknown   Nystatin     Pt can't remember what the allergy was    Promethazine Other (See Comments)    Hallucinations   Sulfa Antibiotics Other (See Comments)    Whelps   Suprax [Cefixime] Other (See Comments)    Unknown   Penicillins Rash    Did it involve swelling of the face/tongue/throat, SOB, or low BP? Unknown Did it involve sudden or severe rash/hives, skin peeling, or any reaction on the inside of your mouth or nose? No Did you need to seek medical attention at a hospital or doctor's office? Unknown When did it last happen?      unknown If all above answers are "NO", may proceed with cephalosporin use.     Family History  Problem Relation Age of Onset   Diabetes Brother    Hypertension Son    Ulcers Sister      Prior to Admission medications   Medication Sig Start Date End Date Taking? Authorizing Provider  LORazepam (ATIVAN) 2 MG tablet Take 1 mg by mouth every 3 (three) hours as needed for anxiety. Takes 1 mg (2 mg cut in half) every 2 - 3 hours 01/03/22  Yes [provider]  polyethylene glycol (MIRALAX / GLYCOLAX) packet Take 17 g by mouth daily.   Yes [provider]  Acetaminophen (TYLENOL PO) Take by mouth. Patient not taking: Reported on 01/04/2022    [provider]  lidocaine (LIDODERM) 5 % Place 1 patch onto the skin daily. Remove & Discard patch within 12 hours or as directed by MD Patient not taking: Reported on 01/04/2022 09/05/21   Luna Fuse, MD    Physical Exam: BP 126/89 (BP Location: Right Arm)   Pulse (!) 105   Temp 98 F (36.7 C) (Axillary)   Resp 20   Ht '5\' 3"'$  (1.6 m)   Wt 38.5 kg   SpO2 91%   BMI 15.04 kg/m   General: 86 y.o. year-old female ill  appearing, cachectic, but in no acute distress.  Alert and oriented x3. HEENT: NCAT, EOMI Neck: Supple, trachea medial Cardiovascular: Tachycardia.  Regular rate and rhythm with no rubs or gallops.  No thyromegaly or JVD noted.  No lower extremity edema. 2/4 pulses in all 4 extremities. Respiratory: Decreased breath sounds in right lower lobe with no wheezes or rales.  Abdomen: Soft, nontender nondistended with normal bowel sounds x4 quadrants. Muskuloskeletal: No cyanosis, clubbing or edema noted bilaterally Neuro: CN II-XII intact, strength 5/5 x 4, sensation, reflexes intact Skin: No ulcerative lesions noted or rashes Psychiatry: Judgement and insight appear normal. Mood is appropriate for condition and setting  Labs on Admission:  Basic Metabolic Panel: Recent Labs  Lab 12/31/21 1608 01/04/22 0038  NA 134* 134*  K 4.1 3.6  CL 99 95*  CO2 25 22  GLUCOSE 116* 116*  BUN 21 22  CREATININE 1.01* 1.08*  CALCIUM 9.6 9.9   Liver Function Tests: Recent Labs  Lab 12/31/21 1608  AST 43*  ALT 18  ALKPHOS 252*  BILITOT 1.2  PROT 6.0*  ALBUMIN 3.0*   Recent Labs  Lab 12/31/21 1608  LIPASE 53*   No results for input(s): "AMMONIA" in the last 168 hours. CBC: Recent Labs  Lab 12/31/21 1608 01/04/22 0038  WBC 5.9 8.8  NEUTROABS 3.0 7.0  HGB 16.5* 18.0*  HCT 48.7* 52.4*  MCV 89.5 90.5  PLT 265 258   Cardiac Enzymes: No results for input(s): "CKTOTAL", "CKMB", "CKMBINDEX", "TROPONINI" in the last 168 hours.  BNP (last 3 results) No results for input(s): "BNP" in the last 8760 hours.  ProBNP (last 3 results) No results for input(s): "PROBNP" in the last 8760 hours.  CBG: Recent Labs  Lab 12/31/21 1525  GLUCAP 167*    Radiological Exams on Admission: CT Head Wo Contrast  Result Date: 01/04/2022 CLINICAL DATA:  Blunt facial trauma. [place] today and yesterday x3. History of metastatic cancer. EXAM: CT HEAD WITHOUT CONTRAST CT CERVICAL SPINE WITHOUT  CONTRAST TECHNIQUE: Multidetector CT imaging of the head and cervical spine was performed following the standard protocol without intravenous contrast. Multiplanar CT image reconstructions of the cervical spine were also generated. RADIATION DOSE REDUCTION: This exam was performed according to the departmental dose-optimization program which includes automated exposure control, adjustment of the mA and/or kV according to patient size and/or use of iterative reconstruction technique. COMPARISON:  None Available. FINDINGS: CT HEAD FINDINGS Brain: Bilateral subdural hematomas with mixed density appearance consistent with acute on chronic subdural hematomas. Right subdural hematoma measures 5 mm depth. Left subdural measures 5 mm depth. Left temporal subarachnoid hemorrhage. Diffuse cerebral atrophy. Ventricular dilatation consistent with central atrophy. Low-attenuation changes in the deep white matter consistent small vessel ischemia. No midline shift. Vascular: No hyperdense vessel or unexpected calcification. Skull: Multiple lucent and destructive bone lesions throughout the calvarium consistent with metastasis. Sinuses/Orbits: Paranasal sinuses are clear. Bilateral mastoid effusions. Other: None. CT CERVICAL SPINE FINDINGS Alignment: Normal alignment. Skull base and vertebrae: Destructive bone lesions demonstrated in the skull base and in multiple vertebra involving vertebral body and posterior elements. No vertebral compression deformities. Soft tissues and spinal canal: No prevertebral soft tissue swelling. No paraspinal soft tissue mass lesions are identified. Disc levels:  Intervertebral disc space heights are mostly normal. Upper chest: Large right pleural effusion. Multiple destructive rib bone lesions. Other: None. IMPRESSION: 1. Bilateral acute on chronic subdural hematomas. Left temporal acute subarachnoid hemorrhage. 2. Diffuse skeletal metastases involving the skull, cervical spine, and ribs. 3. Large  right pleural effusion, increasing since prior PET-CT from 07/01/2021 Critical Value/emergent results were called by telephone at the time of interpretation on 01/03/2022 at 11:52 pm to provider Veryl Speak , who verbally acknowledged these results. Electronically Signed   By: Lucienne Capers M.D.   On: 01/04/2022 00:02   CT Cervical Spine Wo Contrast  Result Date: 01/04/2022 CLINICAL DATA:  Blunt facial trauma. [place] today and yesterday x3. History of metastatic cancer. EXAM: CT HEAD WITHOUT CONTRAST CT CERVICAL SPINE WITHOUT CONTRAST TECHNIQUE: Multidetector CT imaging of the head and cervical spine was performed following the standard protocol without intravenous contrast. Multiplanar CT image reconstructions of the  cervical spine were also generated. RADIATION DOSE REDUCTION: This exam was performed according to the departmental dose-optimization program which includes automated exposure control, adjustment of the mA and/or kV according to patient size and/or use of iterative reconstruction technique. COMPARISON:  None Available. FINDINGS: CT HEAD FINDINGS Brain: Bilateral subdural hematomas with mixed density appearance consistent with acute on chronic subdural hematomas. Right subdural hematoma measures 5 mm depth. Left subdural measures 5 mm depth. Left temporal subarachnoid hemorrhage. Diffuse cerebral atrophy. Ventricular dilatation consistent with central atrophy. Low-attenuation changes in the deep white matter consistent small vessel ischemia. No midline shift. Vascular: No hyperdense vessel or unexpected calcification. Skull: Multiple lucent and destructive bone lesions throughout the calvarium consistent with metastasis. Sinuses/Orbits: Paranasal sinuses are clear. Bilateral mastoid effusions. Other: None. CT CERVICAL SPINE FINDINGS Alignment: Normal alignment. Skull base and vertebrae: Destructive bone lesions demonstrated in the skull base and in multiple vertebra involving vertebral body and  posterior elements. No vertebral compression deformities. Soft tissues and spinal canal: No prevertebral soft tissue swelling. No paraspinal soft tissue mass lesions are identified. Disc levels:  Intervertebral disc space heights are mostly normal. Upper chest: Large right pleural effusion. Multiple destructive rib bone lesions. Other: None. IMPRESSION: 1. Bilateral acute on chronic subdural hematomas. Left temporal acute subarachnoid hemorrhage. 2. Diffuse skeletal metastases involving the skull, cervical spine, and ribs. 3. Large right pleural effusion, increasing since prior PET-CT from 07/01/2021 Critical Value/emergent results were called by telephone at the time of interpretation on 01/03/2022 at 11:52 pm to provider Veryl Speak , who verbally acknowledged these results. Electronically Signed   By: Lucienne Capers M.D.   On: 01/04/2022 00:02    EKG: I independently viewed the EKG done and my findings are as followed: EKG was not done in the ED  Assessment/Plan Present on Admission: **None**  Principal Problem:   Subarachnoid hemorrhage (HCC) Active Problems:   Primary breast cancer with metastasis to other site [facility])   Acute on chronic intracranial subdural hematoma (HCC)   Fall at home, initial encounter   Pleural effusion on right   Bilateral acute on chronic subdural hematoma Left temporal acute subarachnoid hemorrhage Neurosurgery was consulted and patient was deemed not to be a surgical candidate Patient also does not want any intervention Patient was going to be admitted to hospice Please refer to palliative care medical record  Acute respiratory failure with hypoxia Continue supplemental oxygen to maintain O2 sats > 94%  Fall at home Continue fall precaution  Right pleural effusion Thoracentesis option was discussed with patient, but she did not appear to understand the concept of the procedure Family was contacted by ED physician regarding the thoracentesis and  recommended no intervention Patient was going to be admitted to hospice  Metastatic breast cancer Patient was no longer receiving chemotherapy She is currently awaiting hospice placement  DVT prophylaxis: Lovenox  Code Status: DNR  Consults: Palliative care (by ED physician  Family Communication: None at bedside  Severity of Illness: The appropriate patient status for this patient is OBSERVATION. Observation status is judged to be reasonable and necessary in order to provide the required intensity of service to ensure the patient's safety. The patient's presenting symptoms, physical exam findings, and initial radiographic and laboratory data in the context of their medical condition is felt to place them at decreased risk for further clinical deterioration. Furthermore, it is anticipated that the patient will be medically stable for discharge from the hospital within 2 midnights of admission.   Author: Bernadette Hoit,  DO 01/04/2022 11:07 PM  For on call review [email or web address].

## 2022-01-05 DIAGNOSIS — Z880 Allergy status to penicillin: Secondary | ICD-10-CM | POA: Diagnosis not present

## 2022-01-05 DIAGNOSIS — Z515 Encounter for palliative care: Secondary | ICD-10-CM | POA: Diagnosis not present

## 2022-01-05 DIAGNOSIS — Z9011 Acquired absence of right breast and nipple: Secondary | ICD-10-CM | POA: Diagnosis not present

## 2022-01-05 DIAGNOSIS — C7951 Secondary malignant neoplasm of bone: Secondary | ICD-10-CM | POA: Diagnosis present

## 2022-01-05 DIAGNOSIS — R296 Repeated falls: Secondary | ICD-10-CM | POA: Diagnosis present

## 2022-01-05 DIAGNOSIS — S066XAA Traumatic subarachnoid hemorrhage with loss of consciousness status unknown, initial encounter: Secondary | ICD-10-CM | POA: Diagnosis present

## 2022-01-05 DIAGNOSIS — Y92009 Unspecified place in unspecified non-institutional (private) residence as the place of occurrence of the external cause: Secondary | ICD-10-CM | POA: Diagnosis not present

## 2022-01-05 DIAGNOSIS — S065XAA Traumatic subdural hemorrhage with loss of consciousness status unknown, initial encounter: Secondary | ICD-10-CM | POA: Diagnosis present

## 2022-01-05 DIAGNOSIS — I6203 Nontraumatic chronic subdural hemorrhage: Secondary | ICD-10-CM | POA: Diagnosis not present

## 2022-01-05 DIAGNOSIS — J90 Pleural effusion, not elsewhere classified: Secondary | ICD-10-CM | POA: Diagnosis present

## 2022-01-05 DIAGNOSIS — J9601 Acute respiratory failure with hypoxia: Secondary | ICD-10-CM | POA: Diagnosis present

## 2022-01-05 DIAGNOSIS — Z8249 Family history of ischemic heart disease and other diseases of the circulatory system: Secondary | ICD-10-CM | POA: Diagnosis not present

## 2022-01-05 DIAGNOSIS — Z881 Allergy status to other antibiotic agents status: Secondary | ICD-10-CM | POA: Diagnosis not present

## 2022-01-05 DIAGNOSIS — Z882 Allergy status to sulfonamides status: Secondary | ICD-10-CM | POA: Diagnosis not present

## 2022-01-05 DIAGNOSIS — Z888 Allergy status to other drugs, medicaments and biological substances status: Secondary | ICD-10-CM | POA: Diagnosis not present

## 2022-01-05 DIAGNOSIS — Z681 Body mass index (BMI) 19 or less, adult: Secondary | ICD-10-CM | POA: Diagnosis not present

## 2022-01-05 DIAGNOSIS — R627 Adult failure to thrive: Secondary | ICD-10-CM | POA: Diagnosis present

## 2022-01-05 DIAGNOSIS — I609 Nontraumatic subarachnoid hemorrhage, unspecified: Secondary | ICD-10-CM | POA: Diagnosis present

## 2022-01-05 DIAGNOSIS — Z79899 Other long term (current) drug therapy: Secondary | ICD-10-CM | POA: Diagnosis not present

## 2022-01-05 DIAGNOSIS — W19XXXA Unspecified fall, initial encounter: Secondary | ICD-10-CM | POA: Diagnosis present

## 2022-01-05 DIAGNOSIS — E785 Hyperlipidemia, unspecified: Secondary | ICD-10-CM | POA: Diagnosis present

## 2022-01-05 DIAGNOSIS — C50919 Malignant neoplasm of unspecified site of unspecified female breast: Secondary | ICD-10-CM | POA: Diagnosis present

## 2022-01-05 DIAGNOSIS — M810 Age-related osteoporosis without current pathological fracture: Secondary | ICD-10-CM | POA: Diagnosis present

## 2022-01-05 DIAGNOSIS — K5900 Constipation, unspecified: Secondary | ICD-10-CM | POA: Diagnosis present

## 2022-01-05 DIAGNOSIS — I6201 Nontraumatic acute subdural hemorrhage: Secondary | ICD-10-CM | POA: Diagnosis not present

## 2022-01-05 DIAGNOSIS — Z66 Do not resuscitate: Secondary | ICD-10-CM | POA: Diagnosis present

## 2022-01-05 DIAGNOSIS — F419 Anxiety disorder, unspecified: Secondary | ICD-10-CM | POA: Diagnosis present

## 2022-01-05 DIAGNOSIS — Z7189 Other specified counseling: Secondary | ICD-10-CM | POA: Diagnosis not present

## 2022-01-05 MED ORDER — GLYCOPYRROLATE 0.2 MG/ML IJ SOLN: 0.2000 mg | INTRAMUSCULAR | Status: DC | PRN

## 2022-01-05 MED ORDER — ENOXAPARIN SODIUM 30 MG/0.3ML IJ SOSY: 30.0000 mg | PREFILLED_SYRINGE | INTRAMUSCULAR | Status: DC

## 2022-01-05 MED ORDER — HALOPERIDOL 0.5 MG PO TABS: 0.5000 mg | ORAL_TABLET | ORAL | Status: DC | PRN

## 2022-01-05 MED ORDER — BIOTENE DRY MOUTH MT LIQD: 15.0000 mL | OROMUCOSAL | Status: DC | PRN

## 2022-01-05 MED ORDER — POLYVINYL ALCOHOL 1.4 % OP SOLN: 1.0000 [drp] | Freq: Four times a day (QID) | OPHTHALMIC | Status: DC | PRN

## 2022-01-05 MED ORDER — ONDANSETRON 4 MG PO TBDP: 4.0000 mg | ORAL_TABLET | Freq: Three times a day (TID) | ORAL | Status: DC | PRN

## 2022-01-05 MED ORDER — GLYCOPYRROLATE 1 MG PO TABS
1.0000 mg | ORAL_TABLET | ORAL | Status: DC | PRN
  Administered 2022-01-06: 1 mg via ORAL
  Filled 2022-01-05: qty 1

## 2022-01-05 MED ORDER — HALOPERIDOL LACTATE 2 MG/ML PO CONC
0.5000 mg | ORAL | Status: DC | PRN
  Filled 2022-01-05: qty 5

## 2022-01-05 MED ORDER — HALOPERIDOL LACTATE 5 MG/ML IJ SOLN: 0.5000 mg | INTRAMUSCULAR | Status: DC | PRN

## 2022-01-05 NOTE — Progress Notes (Signed)
Brief Summary:- 86 y.o. female with medical history significant of metastatic breast cancer who is no longer receiving therapy (Oncologist had recommended transition to hospice care). and Anxiety disorder, as well as adult failure to thrive and recurrent falls admitted on 01/04/2022 with Bil acute on chronic subdural hematomas, as well as Left temporal acute subarachnoid hemorrhage. -In the setting of diffuse skeletal metastases involving the skull, cervical spine, and ribs. -Currently admitted under palliative/comfort care protocol -Awaiting official hospice consult for possible transfer to residential hospice house -  Subjective -Resting comfortably, received as needed Ativan No new concerns -RN Alexis at bedside  Physical Exam Gen:-Appears comfortable overall not very verbal , cachectic and chronically ill-appearing Lungs-fair air movement, no wheezing  CV- S1, S2 normal, RRR Abd-  +ve B.Sounds, Abd Soft, No tenderness,    Extremity/Skin:- No  edema,   good pedal pulses  NeuroPsych--sleepy/lethargic, wakes up to tactile stimuli   A/p 1) status post fall with ICH-- Bil acute on chronic subdural hematomas, as well as Left temporal acute subarachnoid hemorrhage. -Continue comfort care protocols  2)Metastatic Breast Cancer-no longer getting chemo, oncologist recommended transition to hospice -Neuroimaging studies suggest diffuse skeletal metastases involving the skull, cervical spine, and ribs. -Continue comfort care protocols  3) social/ethics--- please see full palliative consult note --Currently admitted under palliative/comfort care protocol -Awaiting official hospice consult for possible transfer to residential hospice house -Remains DNR/DNI  Total care time over 38 minutes  Roxan Hockey, MD

## 2022-01-05 NOTE — Progress Notes (Signed)
Palliative:  HPI: 86 y.o. female  with past medical history of breast cancer with extensive bone mets admitted on 01/03/2022 with frequent falls and found to have bilateral SDH.    I met today at [facility] bedside along with her son and daughter-in-law. Katherine is sleeping soundly. She stirs on occasion when her son speaks but does not awaken. She appears much more comfortable compared to yesterday. Family at bedside share more about [place] and not allowing anyone in her home. They describes tendencies of OCD and paranoia and understand that she has underlying mental illness that has made her decline more challenging to manage. They are pleased that she is resting better today. She was very anxious and agitated yesterday and fixated on returning to her home. Noted she received Ativan and morphine ~4am this morning - this seems to be helping her well. Family shared more about [facility]. They continue to find peace with knowledge of her faith and acceptance of end of life. They also express relief being able to finally rest well last night themselves knowing she was being cared for here at the hospital. I encouraged self care informing family that we can always call them when needed and giving them permission to step away from bedside as needed. We await hospice evaluation. I continue to recommend hospice facility placement based on progression noted with lethargy, symptom management with pain and anxiety, severe aspiration, poor intake with prognosis likely < 2 weeks.   All questions/concerns addressed. Emotional support provided.   Exam: Lethargic. Does not awaken to my voice - did not make further attempts to awake. Thin, frail, cachectic. Breathing regular, shallow.   Plan: - DNR, comfort care - Recommend hospice facility placement due to poor prognosis and decline as well as symptom management - Prognosis likely < 2 weeks given progressive decline over past few days  35 min  Vinie Sill, NP Palliative  Medicine Team Pager [phone] (Please see [email or web address] for schedule) Team Phone [phone]    Greater than 50%  of this time was spent counseling and coordinating care related to the above assessment and plan

## 2022-01-05 NOTE — TOC Initial Note (Addendum)
Transition of Care [facility]) - Initial/Assessment Note    Patient Details  Name: AGRIPINA GUYETTE MRN: [id number] Date of Birth: 1935/08/16  Transition of Care [facility]) CM/SW Contact:    Iona Beard, [place] Phone Number: 01/05/2022, 11:50 AM  Clinical Narrative:                 CSW followed up with Marchia Meiers at [facility] to follow up on when hospice RN will come to assess pt for residential placement. Marchia Meiers states that they only have one person doing assessments and they are working on a schedule. CSW will be updated when a time as been set. TOC to follow.   Addendum 12:30pm: CSW updated by Marchia Meiers with [facility] that they are not going to be able to assess pt today. They will plan to come out tomorrow. TOC to follow.   Expected Discharge Plan: [place] Barriers to Discharge: Continued Medical Work up   Patient Goals and CMS Choice Patient states their goals for this hospitalization and ongoing recovery are:: hospice residential CMS Medicare.gov Compare Post Acute Care list provided to:: Patient Represenative (must comment) Choice offered to / list presented to : Patient, Adult Children  Expected Discharge Plan and Services Expected Discharge Plan: [place] In-house Referral: Clinical Social Work Discharge Planning Services: CM Consult Post Acute Care Choice: Hospice Living arrangements for the past 2 months: Single Family Home                                      Prior Living Arrangements/Services Living arrangements for the past 2 months: Single Family Home Lives with:: Self Patient language and need for interpreter reviewed:: Yes Do you feel safe going back to the place where you live?: Yes      Need for Family Participation in Patient Care: Yes (Comment) Care giver support system in place?: Yes (comment)   Criminal Activity/Legal Involvement Pertinent to Current Situation/Hospitalization: No - Comment as  needed  Activities of Daily Living   ADL Screening (condition at time of admission) Patient's cognitive ability adequate to safely complete daily activities?: No Does the patient have difficulty concentrating, remembering, or making decisions?: Yes Patient able to express need for assistance with ADLs?: No Does the patient have difficulty dressing or bathing?: Yes Independently performs ADLs?: No Communication: Dependent Is this a change from baseline?: Pre-admission baseline Dressing (OT): Dependent Is this a change from baseline?: Pre-admission baseline Grooming: Dependent Is this a change from baseline?: Pre-admission baseline Feeding: Dependent Is this a change from baseline?: Pre-admission baseline Bathing: Dependent Is this a change from baseline?: Pre-admission baseline Toileting: Dependent Is this a change from baseline?: Pre-admission baseline In/Out Bed: Dependent Is this a change from baseline?: Pre-admission baseline Walks in Home: Dependent Is this a change from baseline?: Pre-admission baseline Does the patient have difficulty walking or climbing stairs?: Yes Weakness of Legs: Both Weakness of Arms/Hands: Both  Permission Sought/Granted                  Emotional Assessment         Alcohol / Substance Use: Not Applicable Psych Involvement: No (comment)  Admission diagnosis:  Subarachnoid hemorrhage ([place]) [I60.9] Subdural hematoma ([place]) [S06.5XAA] Patient Active Problem List   Diagnosis Date Noted   Subarachnoid hemorrhage ([place]) 01/04/2022   Acute on chronic intracranial subdural hematoma ([place]) 01/04/2022   Fall at home,  initial encounter 01/04/2022   Pleural effusion on right 01/04/2022   Primary breast cancer with metastasis to other site [facility]) 06/11/2019   Breast cancer of upper-outer quadrant of right female breast ([place]) 04/02/2018   Ductal carcinoma in situ (DCIS) of right breast 08/29/2017   PCP:  Asencion Noble, MD Pharmacy:   [facility] [place], [place] AT [place] [address] [place] [address] Phone: [phone] Fax: [phone]     Social Determinants of Health (SDOH) Interventions    Readmission Risk Interventions     No data to display

## 2022-01-06 MED ORDER — SCOPOLAMINE 1 MG/3DAYS TD PT72
1.0000 | MEDICATED_PATCH | TRANSDERMAL | Status: DC
  Administered 2022-01-06: 1.5 mg via TRANSDERMAL
  Filled 2022-01-06: qty 1

## 2022-01-06 MED ORDER — MORPHINE SULFATE (CONCENTRATE) 10 MG/0.5ML PO SOLN
5.0000 mg | Freq: Four times a day (QID) | ORAL | Status: DC
  Administered 2022-01-06 – 2022-01-07 (×4): 5 mg via SUBLINGUAL
  Filled 2022-01-06 (×4): qty 0.5

## 2022-01-06 MED ORDER — POLYETHYLENE GLYCOL 3350 17 G PO PACK: 17.0000 g | PACK | Freq: Every day | ORAL | Status: DC

## 2022-01-06 MED ORDER — LORAZEPAM 1 MG PO TABS
2.0000 mg | ORAL_TABLET | Freq: Four times a day (QID) | ORAL | Status: DC
  Administered 2022-01-06 – 2022-01-07 (×4): 2 mg via SUBLINGUAL
  Filled 2022-01-06 (×4): qty 2

## 2022-01-06 MED ORDER — SORBITOL 70 % SOLN: 15.0000 mL | Freq: Every day | Status: DC | PRN

## 2022-01-06 MED ORDER — ATROPINE SULFATE 1 % OP SOLN
2.0000 [drp] | Freq: Three times a day (TID) | OPHTHALMIC | Status: DC
  Administered 2022-01-06 – 2022-01-07 (×2): 2 [drp] via SUBLINGUAL
  Filled 2022-01-06: qty 5

## 2022-01-06 NOTE — Progress Notes (Addendum)
PROGRESS NOTE    Patient: Caitlin Alexander                            PCP: Asencion Noble, MD                    DOB: 01-15-1936            DOA: 01/03/2022 RFF:[id number]             DOS: 01/06/2022, 11:09 AM   LOS: 1 day   Date of Service: The patient was seen and examined on 01/06/2022  Subjective:   The patient was seen and examined this morning. -Mild-moderate respiratory distress, labored breathing with mild Complaining of generalized severe weakness, pain and discomfort, including abdominal distention, constipation.  Patient's son Mr. Caitlin Alexander and his wife present at bedside  Brief Narrative:   Caitlin Alexander is a 86 y.o. female with medical history significant of metastatic breast cancer who is no longer receiving therapy (Oncologist had recommended transition to hospice care). and Anxiety disorder, as well as adult failure to thrive and recurrent falls admitted on 01/04/2022 with B bilateral acute on chronic subdural hematomas, as well as Left temporal acute subarachnoid hemorrhage. -In the setting of diffuse skeletal metastases involving the skull, cervical spine, and ribs. -Currently admitted under palliative/comfort care protocol -Awaiting official hospice consult for possible transfer to residential hospice house   ED course:  Patient was tachycardic, but other vital signs were within normal range, patient was on supplemental oxygen at 5 LPM with O2 sat of 91%.  Work-up in the ED on arrival to the ED showed H/H 18.0/52.4, MCV 90.5, platelets 258.  Showed sodium of 134, potassium 3.2, chloride 95, bicarb 22, glucose 116, BUN 22, creatinine 1.08 CT of head and CT cervical spine without contrast showed 1. Bilateral acute on chronic subdural hematomas. Left temporal acute subarachnoid hemorrhage. 2. Diffuse skeletal metastases involving the skull, cervical spine, and ribs. 3. Large right pleural effusion, increasing since prior PET-CT from 07/01/2021       Assessment and plan:   1) status post fall with ICH-- Bil acute on chronic subdural hematomas, as well as Left temporal acute subarachnoid hemorrhage. -Continue comfort care protocols   2)Metastatic Breast Cancer-no longer getting chemo, oncologist recommended transition to hospice -Neuroimaging studies suggest diffuse skeletal metastases involving the skull, cervical spine, and ribs. -Continue comfort care protocols   3) social/ethics--- please see full palliative consult note --Currently admitted under palliative/comfort care protocol -Awaiting official hospice consult for possible transfer to residential hospice house -Remains DNR/DNI -Palliative care/hospice has been consulted         ----------------------------------------------------------------------------------------------------------------------------------------------- Nutritional status:  The patient's BMI is: Body mass index is 15.04 kg/m. I agree with the assessment and plan as outlined    ------------------------------------------------------------------------------------------------------------------------------------------------  DVT prophylaxis:  SCDs Start: 01/05/22 0156   Code Status:   Code Status: DNR  Family Communication: POA Mr. Caitlin Alexander, son and his wife present at bedside-updated, The above findings and plan of care has been discussed with patient (and family)  in detail,  they expressed understanding and agreement of above. -Advance care planning has been discussed.  -Agreed to DNR/DNI status, consultation to palliative care and hospice  Admission status:   Status is: Inpatient Remains inpatient appropriate because: IV fluids, further evaluation for inpatient hospice     Procedures:   No admission procedures for hospital encounter.   Antimicrobials:  Anti-infectives (From admission,  onward)    None        Medication:   atropine  2 drop Sublingual TID   LORazepam  2 mg Sublingual QID    polyethylene glycol  17 g Oral Daily   scopolamine  1 patch Transdermal Q72H    acetaminophen, antiseptic oral rinse, glycopyrrolate **OR** glycopyrrolate **OR** glycopyrrolate, haloperidol **OR** haloperidol **OR** haloperidol lactate, HYDROmorphone (DILAUDID) injection, LORazepam, morphine CONCENTRATE, ondansetron, polyvinyl alcohol   Objective:   Vitals:   01/04/22 2220 01/05/22 0154 01/05/22 1336 01/05/22 2044  BP: 126/89  116/76 125/60  Pulse: (!) 105  100 (!) 105  Resp: '20  19 18  '$ Temp: 98 F (36.7 C)  97.6 F (36.4 C) 98 F (36.7 C)  TempSrc: Axillary  Axillary   SpO2: 91%  92% 95%  Weight: 38.5 kg     Height:  '5\' 3"'$  (1.6 m)      Intake/Output Summary (Last 24 hours) at 01/06/2022 1109 Last data filed at 01/06/2022 0900 Gross per 24 hour  Intake 0 ml  Output --  Net 0 ml   Filed Weights   01/03/22 2047 01/04/22 2220  Weight: 52 kg 38.5 kg     Examination:   Physical Exam  Constitution:  Alert, cooperative, no distress,  Appears calm and comfortable  Psychiatric:   Normal and stable mood and affect, cognition intact,   HEENT:        Normocephalic, PERRL, otherwise with in Normal limits  Chest:         Chest symmetric Cardio vascular:  S1/S2, RRR, No murmure, No Rubs or Gallops  pulmonary: Clear to auscultation bilaterally, respirations unlabored, negative wheezes / crackles Abdomen: Soft, non-tender, non-distended, bowel sounds,no masses, no organomegaly Muscular skeletal: Limited exam - in bed, able to move all 4 extremities,   Neuro: CNII-XII intact. , normal motor and sensation, reflexes intact  Extremities: No pitting edema lower extremities, +2 pulses  Skin: Dry, warm to touch, negative for any Rashes, No open wounds Wounds: per nursing documentation   ------------------------------------------------------------------------------------------------------------------------------------------    LABs:     Latest Ref Rng & Units 01/04/2022   12:38 AM  12/31/2021    4:08 PM 06/17/2021    1:02 PM  CBC  WBC 4.0 - 10.5 K/uL 8.8  5.9  7.9   Hemoglobin 12.0 - 15.0 g/dL 18.0  16.5  13.3   Hematocrit 36.0 - 46.0 % 52.4  48.7  41.2   Platelets 150 - 400 K/uL 258  265  322       Latest Ref Rng & Units 01/04/2022   12:38 AM 12/31/2021    4:08 PM 06/17/2021    1:02 PM  CMP  Glucose 70 - 99 mg/dL 116  116  96   BUN 8 - 23 mg/dL '22  21  11   '$ Creatinine 0.44 - 1.00 mg/dL 1.08  1.01  0.71   Sodium 135 - 145 mmol/L 134  134  140   Potassium 3.5 - 5.1 mmol/L 3.6  4.1  4.7   Chloride 98 - 111 mmol/L 95  99  106   CO2 22 - 32 mmol/L '22  25  27   '$ Calcium 8.9 - 10.3 mg/dL 9.9  9.6  8.9   Total Protein 6.5 - 8.1 g/dL  6.0  6.7   Total Bilirubin 0.3 - 1.2 mg/dL  1.2  0.5   Alkaline Phos 38 - 126 U/L  252  91   AST 15 - 41 U/L  43  26   ALT 0 - 44 U/L  18  20        Micro Results No results found for this or any previous visit (from the past 240 hour(s)).  Radiology Reports No results found.  SIGNED: Deatra James, MD, FHM. [facility],  Pager (please use [email or web address] to page/text) Please use Epic Secure Chat for non-urgent communication (7AM-7PM)  If 7PM-7AM, please contact night-coverage [email or web address], 01/06/2022, 11:09 AM

## 2022-01-06 NOTE — TOC Progression Note (Signed)
Transition of Care [facility]) - Progression Note    Patient Details  Name: Caitlin Alexander MRN: [id number] Date of Birth: [birth year]  [facility] CM/SW Contact  Shade Flood, LCSW Phone Number: 01/06/2022, 3:50 PM  Clinical Narrative:     Per hospice, pt admitted to GIP status. Updated MD and RN for pt's status to be changed.  Expected Discharge Plan: [place] Barriers to Discharge: Continued Medical Work up  Expected Discharge Plan and Services Expected Discharge Plan: [place] In-house Referral: Clinical Social Work Discharge Planning Services: CM Consult Post Acute Care Choice: Hospice Living arrangements for the past 2 months: Single Family Home                                       Social Determinants of Health (SDOH) Interventions    Readmission Risk Interventions     No data to display

## 2022-01-06 NOTE — Hospital Course (Addendum)
Caitlin Alexander is a 86 y.o. female with medical history significant of metastatic breast cancer who is no longer receiving therapy (Oncologist had recommended transition to hospice care). and Anxiety disorder, as well as adult failure to thrive and recurrent falls admitted on 01/04/2022 with B bilateral acute on chronic subdural hematomas, as well as Left temporal acute subarachnoid hemorrhage. -In the setting of diffuse skeletal metastases involving the skull, cervical spine, and ribs. -Currently admitted under palliative/comfort care protocol -Awaiting official hospice consult for possible transfer to residential hospice house   ED course:  Patient was tachycardic, but other vital signs were within normal range, patient was on supplemental oxygen at 5 LPM with O2 sat of 91%.  Work-up in the ED on arrival to the ED showed H/H 18.0/52.4, MCV 90.5, platelets 258.  Showed sodium of 134, potassium 3.2, chloride 95, bicarb 22, glucose 116, BUN 22, creatinine 1.08 CT of head and CT cervical spine without contrast showed 1. Bilateral acute on chronic subdural hematomas. Left temporal acute subarachnoid hemorrhage. 2. Diffuse skeletal metastases involving the skull, cervical spine, and ribs. 3. Large right pleural effusion, increasing since prior PET-CT from 07/01/2021   Assessment & Plan:     Principal Problem:   Acute on chronic intracranial subdural hematoma (HCC) Active Problems:   Fall at home, initial encounter   Primary breast cancer with metastasis to other site [facility])   Subarachnoid hemorrhage (HCC)   Pleural effusion on right   Assessment and plan:  1) status post fall with ICH-- Bil acute on chronic subdural hematomas, as well as Left temporal acute subarachnoid hemorrhage. -Continue comfort care protocols   2)Metastatic Breast Cancer-no longer getting chemo, oncologist recommended transition to hospice -Neuroimaging studies suggest diffuse skeletal metastases involving the skull,  cervical spine, and ribs. -Continue comfort care protocols   3) social/ethics--- please see full palliative consult note --Currently admitted under palliative/comfort care protocol -Awaiting official hospice consult for possible transfer to residential hospice house -Remains DNR/DNI -Palliative care/hospice has been consulted

## 2022-01-06 NOTE — Progress Notes (Signed)
Patient rested quietly through night until 0300 incontinence care rendered, repositioned for comfort and medicated for pain noted by facial grimacing and restlessness. PRN effective, patient currently resting with eyes closed and call bell within reach.

## 2022-01-06 NOTE — Progress Notes (Signed)
Palliative:  HPI: 86 y.o. female  with past medical history of breast cancer with extensive bone mets admitted on 01/03/2022 with frequent falls and found to have bilateral SDH.    I met today with Caitlin Alexander and family at bedside. She is more awake today but very confused and with garbled voice as she is struggling to manage her secretions. She seems anxious and uncomfortable. I discussed with family medication management and I will adjust anxiety, pain, and secretions medications to better offer her comfort. I spent some time trying to comfort Caitlin Alexander and distract her to help her relax.   I also spoke with hospice liaison regarding prognosis and symptom management. They have made plans to transition to GIP.   All questions/concerns addressed. Emotional support provided. Discussed with RN.   Exam: Awake, alert, confused. Asking same questions repeatedly. Anxious and appears uncomfortable. Breathing regular, unlabored.   Plan: - DNR - Scheduled pain and anxiety medication with PRN available. Adding scopolamine patch for secretion management.   [place], NP Palliative Medicine Team Pager [phone] (Please see [email or web address] for schedule) Team Phone [phone]    Greater than 50%  of this time was spent counseling and coordinating care related to the above assessment and plan

## 2022-01-07 ENCOUNTER — Inpatient Hospital Stay (HOSPITAL_COMMUNITY)
Admission: RE | Admit: 2022-01-07 | Discharge: 2022-01-26 | DRG: 951 | Disposition: E | Source: Ambulatory Visit | Attending: Family Medicine | Admitting: Family Medicine

## 2022-01-07 DIAGNOSIS — Z8249 Family history of ischemic heart disease and other diseases of the circulatory system: Secondary | ICD-10-CM

## 2022-01-07 DIAGNOSIS — Z90410 Acquired total absence of pancreas: Secondary | ICD-10-CM | POA: Diagnosis not present

## 2022-01-07 DIAGNOSIS — W19XXXA Unspecified fall, initial encounter: Secondary | ICD-10-CM | POA: Diagnosis present

## 2022-01-07 DIAGNOSIS — I6201 Nontraumatic acute subdural hemorrhage: Secondary | ICD-10-CM | POA: Diagnosis present

## 2022-01-07 DIAGNOSIS — C50919 Malignant neoplasm of unspecified site of unspecified female breast: Secondary | ICD-10-CM | POA: Diagnosis present

## 2022-01-07 DIAGNOSIS — Z888 Allergy status to other drugs, medicaments and biological substances status: Secondary | ICD-10-CM

## 2022-01-07 DIAGNOSIS — Z881 Allergy status to other antibiotic agents status: Secondary | ICD-10-CM | POA: Diagnosis not present

## 2022-01-07 DIAGNOSIS — Z9049 Acquired absence of other specified parts of digestive tract: Secondary | ICD-10-CM | POA: Diagnosis not present

## 2022-01-07 DIAGNOSIS — I6203 Nontraumatic chronic subdural hemorrhage: Secondary | ICD-10-CM | POA: Diagnosis not present

## 2022-01-07 DIAGNOSIS — J90 Pleural effusion, not elsewhere classified: Secondary | ICD-10-CM | POA: Diagnosis present

## 2022-01-07 DIAGNOSIS — S066XAA Traumatic subarachnoid hemorrhage with loss of consciousness status unknown, initial encounter: Secondary | ICD-10-CM | POA: Diagnosis present

## 2022-01-07 DIAGNOSIS — M1990 Unspecified osteoarthritis, unspecified site: Secondary | ICD-10-CM | POA: Diagnosis present

## 2022-01-07 DIAGNOSIS — G9349 Other encephalopathy: Secondary | ICD-10-CM | POA: Diagnosis present

## 2022-01-07 DIAGNOSIS — Z66 Do not resuscitate: Secondary | ICD-10-CM | POA: Diagnosis present

## 2022-01-07 DIAGNOSIS — Z9011 Acquired absence of right breast and nipple: Secondary | ICD-10-CM

## 2022-01-07 DIAGNOSIS — Z9221 Personal history of antineoplastic chemotherapy: Secondary | ICD-10-CM

## 2022-01-07 DIAGNOSIS — Z90710 Acquired absence of both cervix and uterus: Secondary | ICD-10-CM | POA: Diagnosis not present

## 2022-01-07 DIAGNOSIS — F419 Anxiety disorder, unspecified: Secondary | ICD-10-CM | POA: Diagnosis present

## 2022-01-07 DIAGNOSIS — M810 Age-related osteoporosis without current pathological fracture: Secondary | ICD-10-CM | POA: Diagnosis present

## 2022-01-07 DIAGNOSIS — G9340 Encephalopathy, unspecified: Secondary | ICD-10-CM | POA: Diagnosis not present

## 2022-01-07 DIAGNOSIS — Z882 Allergy status to sulfonamides status: Secondary | ICD-10-CM | POA: Diagnosis not present

## 2022-01-07 DIAGNOSIS — Z853 Personal history of malignant neoplasm of breast: Secondary | ICD-10-CM

## 2022-01-07 DIAGNOSIS — C7951 Secondary malignant neoplasm of bone: Secondary | ICD-10-CM | POA: Diagnosis present

## 2022-01-07 DIAGNOSIS — E785 Hyperlipidemia, unspecified: Secondary | ICD-10-CM | POA: Diagnosis present

## 2022-01-07 DIAGNOSIS — R296 Repeated falls: Secondary | ICD-10-CM | POA: Diagnosis present

## 2022-01-07 DIAGNOSIS — R627 Adult failure to thrive: Secondary | ICD-10-CM | POA: Diagnosis present

## 2022-01-07 DIAGNOSIS — Z9081 Acquired absence of spleen: Secondary | ICD-10-CM

## 2022-01-07 DIAGNOSIS — Z880 Allergy status to penicillin: Secondary | ICD-10-CM | POA: Diagnosis not present

## 2022-01-07 DIAGNOSIS — C50411 Malignant neoplasm of upper-outer quadrant of right female breast: Secondary | ICD-10-CM | POA: Diagnosis present

## 2022-01-07 DIAGNOSIS — S065XAA Traumatic subdural hemorrhage with loss of consciousness status unknown, initial encounter: Secondary | ICD-10-CM | POA: Diagnosis present

## 2022-01-07 DIAGNOSIS — Z515 Encounter for palliative care: Principal | ICD-10-CM

## 2022-01-07 DIAGNOSIS — Z79899 Other long term (current) drug therapy: Secondary | ICD-10-CM

## 2022-01-07 MED ORDER — LORAZEPAM 1 MG PO TABS
2.0000 mg | ORAL_TABLET | Freq: Four times a day (QID) | ORAL | Status: DC
  Administered 2022-01-07 – 2022-01-09 (×9): 2 mg via SUBLINGUAL
  Filled 2022-01-07 (×10): qty 2

## 2022-01-07 MED ORDER — MORPHINE SULFATE (CONCENTRATE) 10 MG/0.5ML PO SOLN
5.0000 mg | ORAL | Status: DC | PRN
  Administered 2022-01-08 – 2022-01-09 (×2): 5 mg via SUBLINGUAL
  Filled 2022-01-07 (×2): qty 0.5

## 2022-01-07 MED ORDER — POLYVINYL ALCOHOL 1.4 % OP SOLN: 1.0000 [drp] | Freq: Four times a day (QID) | OPHTHALMIC | Status: DC | PRN

## 2022-01-07 MED ORDER — MORPHINE SULFATE (CONCENTRATE) 10 MG/0.5ML PO SOLN
5.0000 mg | Freq: Four times a day (QID) | ORAL | Status: DC
  Administered 2022-01-07 – 2022-01-09 (×9): 5 mg via SUBLINGUAL
  Filled 2022-01-07 (×9): qty 0.5

## 2022-01-07 MED ORDER — HALOPERIDOL LACTATE 5 MG/ML IJ SOLN: 0.5000 mg | INTRAMUSCULAR | Status: DC | PRN

## 2022-01-07 MED ORDER — HALOPERIDOL 0.5 MG PO TABS: 0.5000 mg | ORAL_TABLET | ORAL | Status: DC | PRN

## 2022-01-07 MED ORDER — ATROPINE SULFATE 1 % OP SOLN
2.0000 [drp] | Freq: Three times a day (TID) | OPHTHALMIC | Status: DC
  Administered 2022-01-07 – 2022-01-09 (×8): 2 [drp] via SUBLINGUAL
  Filled 2022-01-07: qty 5

## 2022-01-07 MED ORDER — HALOPERIDOL LACTATE 2 MG/ML PO CONC: 0.5000 mg | ORAL | Status: DC | PRN

## 2022-01-07 MED ORDER — ONDANSETRON 4 MG PO TBDP: 4.0000 mg | ORAL_TABLET | Freq: Three times a day (TID) | ORAL | Status: DC | PRN

## 2022-01-07 MED ORDER — ACETAMINOPHEN 650 MG RE SUPP
650.0000 mg | Freq: Four times a day (QID) | RECTAL | Status: DC | PRN
  Administered 2022-01-09: 650 mg via RECTAL
  Filled 2022-01-07: qty 1

## 2022-01-07 MED ORDER — LORAZEPAM 1 MG PO TABS: 1.0000 mg | ORAL_TABLET | ORAL | Status: DC | PRN

## 2022-01-07 MED ORDER — BIOTENE DRY MOUTH MT LIQD: 15.0000 mL | OROMUCOSAL | Status: DC | PRN

## 2022-01-07 MED ORDER — SCOPOLAMINE 1 MG/3DAYS TD PT72
1.0000 | MEDICATED_PATCH | TRANSDERMAL | Status: DC
  Administered 2022-01-09: 1.5 mg via TRANSDERMAL
  Filled 2022-01-07: qty 1

## 2022-01-07 MED ORDER — GLYCOPYRROLATE 1 MG PO TABS: 1.0000 mg | ORAL_TABLET | ORAL | Status: DC | PRN

## 2022-01-07 MED ORDER — HYDROMORPHONE HCL 1 MG/ML IJ SOLN: 0.2500 mg | INTRAMUSCULAR | Status: DC | PRN

## 2022-01-07 MED ORDER — GLYCOPYRROLATE 0.2 MG/ML IJ SOLN: 0.2000 mg | INTRAMUSCULAR | Status: DC | PRN

## 2022-01-07 MED ORDER — SORBITOL 70 % SOLN: 15.0000 mL | Freq: Every day | Status: DC | PRN

## 2022-01-07 MED ORDER — POLYETHYLENE GLYCOL 3350 17 G PO PACK: 17.0000 g | PACK | Freq: Every day | ORAL | Status: DC

## 2022-01-07 NOTE — H&P (Signed)
History and Physical   Patient: Caitlin Alexander                            PCP: Asencion Noble, MD                    DOB: Apr 18, 1935            DOA: 01/04/2022 SAY:[id number]             DOS: 12/26/2021, 12:43 PM  Asencion Noble, MD  Patient coming from:   HOME  I have personally reviewed patient's medical records, in electronic medical records, including:  [facility], and care everywhere.    Chief Complaint:   No chief complaint on file.   History of present illness:    Caitlin Alexander is a 86 y.o. female with medical history significant of metastatic breast cancer who is no longer receiving therapy (Oncologist had recommended transition to hospice care). and Anxiety disorder, as well as adult failure to thrive and recurrent falls admitted on 01/04/2022 with B bilateral acute on chronic subdural hematomas, as well as Left temporal acute subarachnoid hemorrhage. -In the setting of diffuse skeletal metastases involving the skull, cervical spine, and ribs. -Currently admitted under palliative/comfort care protocol -Awaiting official hospice consult for possible transfer to residential hospice house     ED course:  Patient was tachycardic, but other vital signs were within normal range, patient was on supplemental oxygen at 5 LPM with O2 sat of 91%.  Work-up in the ED on arrival to the ED showed H/H 18.0/52.4, MCV 90.5, platelets 258.  Showed sodium of 134, potassium 3.2, chloride 95, bicarb 22, glucose 116, BUN 22, creatinine 1.08 CT of head and CT cervical spine without contrast showed 1. Bilateral acute on chronic subdural hematomas. Left temporal acute subarachnoid hemorrhage. 2. Diffuse skeletal metastases involving the skull, cervical spine, and ribs. 3. Large right pleural effusion, increasing since prior PET-CT from 07/01/2021.   The patient continued to decline, now has significant altered mental status change, agonal breathing,--- family has agreed to hospice  care  Anticipating in-hospital death   Assessment & Plan:        Principal Problem:   Acute on chronic intracranial subdural hematoma (HCC) Active Problems:   Fall at home, initial encounter   Primary breast cancer with metastasis to other site [facility])   Subarachnoid hemorrhage (HCC)   Pleural effusion on right   Assessment and plan:  1) status post fall with ICH-- Bil acute on chronic subdural hematomas, as well as Left temporal acute subarachnoid hemorrhage. -Continue comfort care protocols   2)Metastatic Breast Cancer-no longer getting chemo, oncologist  -Transition to hospice  -Neuroimaging studies suggest diffuse skeletal metastases involving the skull, cervical spine, and ribs. -Continue comfort care protocols   3) social/ethics--- palliative consulted, hospice on board now -Patient is confirmed DNR/DNI -Transition to hospice care/comfort care per protocol       Discussed with patient and family.  Patient currently unconscious. Hospice on board agreed to convert the patient to GIP     Consultants: Inpatient hospice Disposition: Hospice     Review of Systems: As per HPI, otherwise 10 point review of systems were negative.   ----------------------------------------------------------------------------------------------------------------------  Allergies  Allergen Reactions   Macrodantin [Nitrofurantoin Macrocrystal] Other (See Comments)    Unknown   Nystatin     Pt can't remember what the allergy was    Promethazine Other (See Comments)  Hallucinations   Sulfa Antibiotics Other (See Comments)    Whelps   Suprax [Cefixime] Other (See Comments)    Unknown   Penicillins Rash    Did it involve swelling of the face/tongue/throat, SOB, or low BP? Unknown Did it involve sudden or severe rash/hives, skin peeling, or any reaction on the inside of your mouth or nose? No Did you need to seek medical attention at a hospital or doctor's office? Unknown When did  it last happen?      unknown If all above answers are "NO", may proceed with cephalosporin use.     Home MEDs:  Prior to Admission medications   Medication Sig Start Date End Date Taking? Authorizing Provider  Acetaminophen (TYLENOL PO) Take by mouth. Patient not taking: Reported on 01/04/2022    [provider]  lidocaine (LIDODERM) 5 % Place 1 patch onto the skin daily. Remove & Discard patch within 12 hours or as directed by MD Patient not taking: Reported on 01/04/2022 09/05/21   Luna Fuse, MD  LORazepam (ATIVAN) 2 MG tablet Take 1 mg by mouth every 3 (three) hours as needed for anxiety. Takes 1 mg (2 mg cut in half) every 2 - 3 hours 01/03/22   [provider]  polyethylene glycol (MIRALAX / GLYCOLAX) packet Take 17 g by mouth daily.    [provider]    PRN MEDs:   Past Medical History:  Diagnosis Date   Anxiety    Arthritis    Breast cancer ([place]) 2018   Cancer ([place])    breast cancer   Concussion    hit by a softball on left side of head   Hyperlipidemia    Osteoporosis    Pericarditis 1961   PONV (postoperative nausea and vomiting)    Thyroiditis 1971    Past Surgical History:  Procedure Laterality Date   ABDOMINAL HYSTERECTOMY  1985   ABDOMINAL SURGERY     APPENDECTOMY     BOWEL RESECTION  10/31/2011   Procedure: SMALL BOWEL RESECTION;  Surgeon: Donato Heinz, MD;  Location: [facility];  Service: General;;   BREAST LUMPECTOMY Right    BREAST LUMPECTOMY WITH RADIOACTIVE SEED LOCALIZATION Right 02/07/2017   Procedure: BREAST LUMPECTOMY WITH RADIOACTIVE SEED LOCALIZATION;  Surgeon: Rolm Bookbinder, MD;  Location: [place];  Service: General;  Laterality: Right;   BREAST LUMPECTOMY WITH RADIOACTIVE SEED LOCALIZATION Right 02/27/2018   Procedure: RIGHT BREAST LUMPECTOMY WITH RADIOACTIVE SEED LOCALIZATION;  Surgeon: Rolm Bookbinder, MD;  Location: [place];  Service: General;  Laterality: Right;   FISSURECTOMY     LAPAROSCOPIC DISTAL  PANCREATECTOMY  2009   and splenectomy   LAPAROTOMY  10/31/2011   Procedure: EXPLORATORY LAPAROTOMY;  Surgeon: Donato Heinz, MD;  Location: [facility];  Service: General;  Laterality: N/A;   MASTECTOMY Right 2021   NASAL SEPTUM SURGERY  1990   NASAL SINUS SURGERY     SIMPLE MASTECTOMY WITH AXILLARY SENTINEL NODE BIOPSY Right 06/11/2019   Procedure: RIGHT MASTECTOMY;  Surgeon: Rolm Bookbinder, MD;  Location: [place];  Service: General;  Laterality: Right;   TUBAL LIGATION       reports that she has never smoked. She has never used smokeless tobacco. She reports that she does not drink alcohol and does not use drugs.   Family History  Problem Relation Age of Onset   Diabetes Brother    Hypertension Son    Ulcers Sister     Physical Exam:   There were no  vitals filed for this visit.    Physical Exam:   General:  Patient is unresponsive, agonal breathing  HEENT:  Limited exam  Neuro:  Mental exam patient is unconscious  Lungs:   Agonal breathing, gurgles  Cardio:    S1/S2, RRR, No murmure, No Rubs or Gallops   Abdomen:  Soft, non-tender, bowel sounds active all four quadrants, no guarding or peritoneal signs.  Muscular  skeletal:  Limited exam -global generalized weaknesses  Skin:  Dry, warm to touch, negative for any Rashes,  Wounds: Please see nursing documentation          Labs on admission:    I have personally reviewed following labs and imaging studies  CBC: Recent Labs  Lab 12/31/21 1608 01/04/22 0038  WBC 5.9 8.8  NEUTROABS 3.0 7.0  HGB 16.5* 18.0*  HCT 48.7* 52.4*  MCV 89.5 90.5  PLT 265 751   Basic Metabolic Panel: Recent Labs  Lab 12/31/21 1608 01/04/22 0038  NA 134* 134*  K 4.1 3.6  CL 99 95*  CO2 25 22  GLUCOSE 116* 116*  BUN 21 22  CREATININE 1.01* 1.08*  CALCIUM 9.6 9.9   GFR: Estimated Creatinine Clearance: 23.6 mL/min (A) (by C-G formula based on SCr of 1.08 mg/dL (H)). Liver Function Tests: Recent Labs  Lab 12/31/21 1608   AST 43*  ALT 18  ALKPHOS 252*  BILITOT 1.2  PROT 6.0*  ALBUMIN 3.0*    CBG: Recent Labs  Lab 12/31/21 1525  GLUCAP 167*   Lipid Profile: Urine analysis:    Component Value Date/Time   COLORURINE YELLOW 11/03/2011 [place] 11/03/2011 1155   LABSPEC 1.015 11/03/2011 1155   PHURINE 6.0 11/03/2011 1155   GLUCOSEU NEGATIVE 11/03/2011 1155   HGBUR TRACE (A) 11/03/2011 1155   BILIRUBINUR NEGATIVE 11/03/2011 1155   KETONESUR 40 (A) 11/03/2011 1155   PROTEINUR NEGATIVE 11/03/2011 1155   UROBILINOGEN 0.2 11/03/2011 1155   NITRITE NEGATIVE 11/03/2011 1155   LEUKOCYTESUR NEGATIVE 11/03/2011 1155    Last A1C:  No results found for: "HGBA1C"   Radiologic Exams on Admission:   No results found.  EKG:   Independently reviewed.  Orders placed or performed in visit on 12/31/21   EKG 12-Lead   EKG 12-Lead   EKG 12-Lead   ---------------------------------------------------------------------------------------------------------------------------------------   Family Communication:  none at bedside  (The above findings and plan of care has been discussed with patient in detail, the patient expressed understanding and agreement of above plan)  --------------------------------------------------------------------------------------------------------------------------------------------------  Disposition Plan: TBD      ----------------------------------------------------------------------------------------------------------------------------------------------------  Time spent: > than  3  Min.   SIGNED: Deatra James, MD, FHM. [facility],  Pager (Please use [email or web address] to page to text)  If 7PM-7AM, please contact night-coverage [email or web address],  01/14/2022, 12:43 PM

## 2022-01-07 NOTE — Plan of Care (Signed)
  Problem: Education: Goal: Knowledge of the prescribed therapeutic regimen will improve Outcome: Progressing   Problem: Coping: Goal: Ability to identify and develop effective coping behavior will improve Outcome: Progressing   

## 2022-01-07 NOTE — Discharge Summary (Signed)
Physician Discharge Summary   Patient: Caitlin Alexander MRN: [id number] DOB: 07-12-1935  Admit date:     01/03/2022  Discharge date: 01/08/2022  Discharge Physician: Deatra James   PCP: Asencion Noble, MD   Recommendations at discharge:    Hospice   Discharge Diagnoses: Principal Problem:   Acute on chronic intracranial subdural hematoma [facility]) Active Problems:   Fall at home, initial encounter   Primary breast cancer with metastasis to other site [facility])   Subarachnoid hemorrhage ([place])   Pleural effusion on right  Resolved Problems:   * No resolved hospital problems. [facility] Course: Caitlin Alexander is a 86 y.o. female with medical history significant of metastatic breast cancer who is no longer receiving therapy (Oncologist had recommended transition to hospice care). and Anxiety disorder, as well as adult failure to thrive and recurrent falls admitted on 01/04/2022 with B bilateral acute on chronic subdural hematomas, as well as Left temporal acute subarachnoid hemorrhage. -In the setting of diffuse skeletal metastases involving the skull, cervical spine, and ribs. -Currently admitted under palliative/comfort care protocol -Awaiting official hospice consult for possible transfer to residential hospice house   ED course:  Patient was tachycardic, but other vital signs were within normal range, patient was on supplemental oxygen at 5 LPM with O2 sat of 91%.  Work-up in the ED on arrival to the ED showed H/H 18.0/52.4, MCV 90.5, platelets 258.  Showed sodium of 134, potassium 3.2, chloride 95, bicarb 22, glucose 116, BUN 22, creatinine 1.08 CT of head and CT cervical spine without contrast showed 1. Bilateral acute on chronic subdural hematomas. Left temporal acute subarachnoid hemorrhage. 2. Diffuse skeletal metastases involving the skull, cervical spine, and ribs. 3. Large right pleural effusion, increasing since prior PET-CT from 07/01/2021   Assessment & Plan:     Principal  Problem:   Acute on chronic intracranial subdural hematoma (HCC) Active Problems:   Fall at home, initial encounter   Primary breast cancer with metastasis to other site [facility])   Subarachnoid hemorrhage (HCC)   Pleural effusion on right   Assessment and plan:  1) status post fall with ICH-- Bil acute on chronic subdural hematomas, as well as Left temporal acute subarachnoid hemorrhage. -Continue comfort care protocols   2)Metastatic Breast Cancer-no longer getting chemo, oncologist recommended transition to hospice -Neuroimaging studies suggest diffuse skeletal metastases involving the skull, cervical spine, and ribs. -Continue comfort care protocols   3) social/ethics--- please see full palliative consult note --Currently admitted under palliative/comfort care protocol -Awaiting official hospice consult for possible transfer to residential hospice house -Remains DNR/DNI -Palliative care/hospice has been consulted   Discussed with patient and family.  Patient currently unconscious. Hospice on board agreed to convert the patient to GIP   Consultants: Inpatient hospice Disposition: Hospice   DISCHARGE MEDICATION: Allergies as of 01/05/2022       Reactions   Macrodantin [nitrofurantoin Macrocrystal] Other (See Comments)   Unknown   Nystatin    Pt can't remember what the allergy was    Promethazine Other (See Comments)   Hallucinations   Sulfa Antibiotics Other (See Comments)   Whelps   Suprax [cefixime] Other (See Comments)   Unknown   Penicillins Rash   Did it involve swelling of the face/tongue/throat, SOB, or low BP? Unknown Did it involve sudden or severe rash/hives, skin peeling, or any reaction on the inside of your mouth or nose? No Did you need to seek medical attention at a hospital or doctor's office?  Unknown When did it last happen?      unknown If all above answers are "NO", may proceed with cephalosporin use.        Medication List     ASK your  doctor about these medications    lidocaine 5 % Commonly known as: Lidoderm Place 1 patch onto the skin daily. Remove & Discard patch within 12 hours or as directed by MD   LORazepam 2 MG tablet Commonly known as: ATIVAN Take 1 mg by mouth every 3 (three) hours as needed for anxiety. Takes 1 mg (2 mg cut in half) every 2 - 3 hours Ask about: Which instructions should I use?   polyethylene glycol 17 g packet Commonly known as: MIRALAX / GLYCOLAX Take 17 g by mouth daily.   TYLENOL PO Take by mouth.        Discharge Exam: Filed Weights   01/03/22 2047 01/04/22 2220  Weight: 52 kg 38.5 kg      Physical Exam:   General:  Patient is unresponsive, agonal breathing  HEENT:  Limited exam  Neuro:  Mental exam patient is unconscious  Lungs:   Agonal breathing, gurgles  Cardio:    S1/S2, RRR, No murmure, No Rubs or Gallops   Abdomen:  Soft, non-tender, bowel sounds active all four quadrants, no guarding or peritoneal signs.  Muscular  skeletal:  Limited exam -global generalized weaknesses  Skin:  Dry, warm to touch, negative for any Rashes,  Wounds: Please see nursing documentation          Condition at discharge: critical  The results of significant diagnostics from this hospitalization (including imaging, microbiology, ancillary and laboratory) are listed below for reference.   Imaging Studies: CT Head Wo Contrast  Result Date: 01/04/2022 CLINICAL DATA:  Blunt facial trauma. [place] today and yesterday x3. History of metastatic cancer. EXAM: CT HEAD WITHOUT CONTRAST CT CERVICAL SPINE WITHOUT CONTRAST TECHNIQUE: Multidetector CT imaging of the head and cervical spine was performed following the standard protocol without intravenous contrast. Multiplanar CT image reconstructions of the cervical spine were also generated. RADIATION DOSE REDUCTION: This exam was performed according to the departmental dose-optimization program which includes automated exposure control,  adjustment of the mA and/or kV according to patient size and/or use of iterative reconstruction technique. COMPARISON:  None Available. FINDINGS: CT HEAD FINDINGS Brain: Bilateral subdural hematomas with mixed density appearance consistent with acute on chronic subdural hematomas. Right subdural hematoma measures 5 mm depth. Left subdural measures 5 mm depth. Left temporal subarachnoid hemorrhage. Diffuse cerebral atrophy. Ventricular dilatation consistent with central atrophy. Low-attenuation changes in the deep white matter consistent small vessel ischemia. No midline shift. Vascular: No hyperdense vessel or unexpected calcification. Skull: Multiple lucent and destructive bone lesions throughout the calvarium consistent with metastasis. Sinuses/Orbits: Paranasal sinuses are clear. Bilateral mastoid effusions. Other: None. CT CERVICAL SPINE FINDINGS Alignment: Normal alignment. Skull base and vertebrae: Destructive bone lesions demonstrated in the skull base and in multiple vertebra involving vertebral body and posterior elements. No vertebral compression deformities. Soft tissues and spinal canal: No prevertebral soft tissue swelling. No paraspinal soft tissue mass lesions are identified. Disc levels:  Intervertebral disc space heights are mostly normal. Upper chest: Large right pleural effusion. Multiple destructive rib bone lesions. Other: None. IMPRESSION: 1. Bilateral acute on chronic subdural hematomas. Left temporal acute subarachnoid hemorrhage. 2. Diffuse skeletal metastases involving the skull, cervical spine, and ribs. 3. Large right pleural effusion, increasing since prior PET-CT from 07/01/2021 Critical Value/emergent results were called by  telephone at the time of interpretation on 01/03/2022 at 11:52 pm to provider Veryl Speak , who verbally acknowledged these results. Electronically Signed   By: Lucienne Capers M.D.   On: 01/04/2022 00:02   CT Cervical Spine Wo Contrast  Result Date:  01/04/2022 CLINICAL DATA:  Blunt facial trauma. [place] today and yesterday x3. History of metastatic cancer. EXAM: CT HEAD WITHOUT CONTRAST CT CERVICAL SPINE WITHOUT CONTRAST TECHNIQUE: Multidetector CT imaging of the head and cervical spine was performed following the standard protocol without intravenous contrast. Multiplanar CT image reconstructions of the cervical spine were also generated. RADIATION DOSE REDUCTION: This exam was performed according to the departmental dose-optimization program which includes automated exposure control, adjustment of the mA and/or kV according to patient size and/or use of iterative reconstruction technique. COMPARISON:  None Available. FINDINGS: CT HEAD FINDINGS Brain: Bilateral subdural hematomas with mixed density appearance consistent with acute on chronic subdural hematomas. Right subdural hematoma measures 5 mm depth. Left subdural measures 5 mm depth. Left temporal subarachnoid hemorrhage. Diffuse cerebral atrophy. Ventricular dilatation consistent with central atrophy. Low-attenuation changes in the deep white matter consistent small vessel ischemia. No midline shift. Vascular: No hyperdense vessel or unexpected calcification. Skull: Multiple lucent and destructive bone lesions throughout the calvarium consistent with metastasis. Sinuses/Orbits: Paranasal sinuses are clear. Bilateral mastoid effusions. Other: None. CT CERVICAL SPINE FINDINGS Alignment: Normal alignment. Skull base and vertebrae: Destructive bone lesions demonstrated in the skull base and in multiple vertebra involving vertebral body and posterior elements. No vertebral compression deformities. Soft tissues and spinal canal: No prevertebral soft tissue swelling. No paraspinal soft tissue mass lesions are identified. Disc levels:  Intervertebral disc space heights are mostly normal. Upper chest: Large right pleural effusion. Multiple destructive rib bone lesions. Other: None. IMPRESSION: 1. Bilateral acute  on chronic subdural hematomas. Left temporal acute subarachnoid hemorrhage. 2. Diffuse skeletal metastases involving the skull, cervical spine, and ribs. 3. Large right pleural effusion, increasing since prior PET-CT from 07/01/2021 Critical Value/emergent results were called by telephone at the time of interpretation on 01/03/2022 at 11:52 pm to provider Veryl Speak , who verbally acknowledged these results. Electronically Signed   By: Lucienne Capers M.D.   On: 01/04/2022 00:02   CT Head Wo Contrast  Result Date: 12/31/2021 CLINICAL DATA:  Trauma, multiple falls EXAM: CT HEAD WITHOUT CONTRAST TECHNIQUE: Contiguous axial images were obtained from the base of the skull through the vertex without intravenous contrast. RADIATION DOSE REDUCTION: This exam was performed according to the departmental dose-optimization program which includes automated exposure control, adjustment of the mA and/or kV according to patient size and/or use of iterative reconstruction technique. COMPARISON:  MR brain done on 07/23/2021 FINDINGS: Brain: No acute intracranial findings are seen in noncontrast CT brain. There are no signs of bleeding within the cranium. Ventricles are not dilated. Cortical sulci are prominent. There is decreased density in periventricular white matter. Vascular: Unremarkable. Skull: There are multiple lytic lesions of varying sizes scattered throughout calvarium. There is interval increase in number and size of the lytic lesions since 07/23/2021. Sinuses/Orbits: There are no air-fluid levels in paranasal sinuses. No focal abnormalities are seen in orbits. Other: None. IMPRESSION: No acute intracranial findings are seen in noncontrast CT brain. Atrophy. There are multiple lytic lesions of varying sizes scattered throughout calvarium suggesting skeletal metastatic disease with interval worsening. Electronically Signed   By: Elmer Picker M.D.   On: 12/31/2021 17:07   DG Elbow Complete Right  Result  Date: 12/31/2021 CLINICAL DATA:  Fall EXAM: RIGHT ELBOW - COMPLETE 3+ VIEW COMPARISON:  None Available. FINDINGS: No acute fracture or malalignment.  No significant elbow effusion. IMPRESSION: No acute osseous abnormality Electronically Signed   By: Donavan Foil M.D.   On: 12/31/2021 16:34   DG Elbow Complete Left  Result Date: 12/31/2021 CLINICAL DATA:  Fall EXAM: LEFT ELBOW - COMPLETE 3+ VIEW COMPARISON:  None Available. FINDINGS: No fracture or malalignment.  No significant elbow effusion IMPRESSION: No acute osseous abnormality Electronically Signed   By: Donavan Foil M.D.   On: 12/31/2021 16:33   DG Chest Port 1 View  Result Date: 12/31/2021 CLINICAL DATA:  Fall. EXAM: PORTABLE CHEST 1 VIEW COMPARISON:  June 30, 2015. FINDINGS: Moderate to large right pleural effusion is noted with associated right basilar atelectasis or infiltrate. Left lung is clear. Visualized portion of cardiomediastinal silhouette is unremarkable. Patient is rotated to the right. Visualized bony thorax is unremarkable. IMPRESSION: Moderate to large right pleural effusion is noted with associated right basilar atelectasis or infiltrate. Electronically Signed   By: Marijo Conception M.D.   On: 12/31/2021 16:33    Microbiology: Results for orders placed or performed during the hospital encounter of 06/07/19  SARS CORONAVIRUS 2 (TAT 6-24 HRS) Nasopharyngeal Nasopharyngeal Swab     Status: None   Collection Time: 06/07/19  7:25 AM   Specimen: Nasopharyngeal Swab  Result Value Ref Range Status   SARS Coronavirus 2 NEGATIVE NEGATIVE Final    Comment: (NOTE) SARS-CoV-2 target nucleic acids are NOT DETECTED. The SARS-CoV-2 RNA is generally detectable in upper and lower respiratory specimens during the acute phase of infection. Negative results do not preclude SARS-CoV-2 infection, do not rule out co-infections with other pathogens, and should not be used as the sole basis for treatment or other patient management  decisions. Negative results must be combined with clinical observations, patient history, and epidemiological information. The expected result is Negative. Fact Sheet for Patients: SugarRoll.be Fact Sheet for Healthcare Providers: https://www.woods-mathews.com/ This test is not yet approved or cleared by the [place] FDA and  has been authorized for detection and/or diagnosis of SARS-CoV-2 by FDA under an Emergency Use Authorization (EUA). This EUA will remain  in effect (meaning this test can be used) for the duration of the COVID-19 declaration under Section 56 4(b)(1) of the Act, 21 U.S.C. section 360bbb-3(b)(1), unless the authorization is terminated or revoked sooner. Performed at [facility], [place] [address], [place], [place] [address]     Labs: CBC: Recent Labs  Lab 12/31/21 1608 01/04/22 0038  WBC 5.9 8.8  NEUTROABS 3.0 7.0  HGB 16.5* 18.0*  HCT 48.7* 52.4*  MCV 89.5 90.5  PLT 265 322   Basic Metabolic Panel: Recent Labs  Lab 12/31/21 1608 01/04/22 0038  NA 134* 134*  K 4.1 3.6  CL 99 95*  CO2 25 22  GLUCOSE 116* 116*  BUN 21 22  CREATININE 1.01* 1.08*  CALCIUM 9.6 9.9   Liver Function Tests: Recent Labs  Lab 12/31/21 1608  AST 43*  ALT 18  ALKPHOS 252*  BILITOT 1.2  PROT 6.0*  ALBUMIN 3.0*   CBG: Recent Labs  Lab 12/31/21 1525  GLUCAP 167*    Discharge time spent: greater than 30 minutes.  Signed: Deatra James, MD [facility] 01/04/2022

## 2022-01-07 NOTE — Hospital Course (Addendum)
Caitlin Alexander is a 86 y.o. female with medical history significant of metastatic breast cancer who is no longer receiving therapy (Oncologist had recommended transition to hospice care). and Anxiety disorder, as well as adult failure to thrive and recurrent falls admitted on 01/04/2022 with B bilateral acute on chronic subdural hematomas, as well as Left temporal acute subarachnoid hemorrhage. -In the setting of diffuse skeletal metastases involving the skull, cervical spine, and ribs. -Currently admitted under palliative/comfort care protocol -Awaiting official hospice consult for possible transfer to residential hospice house     ED course:  Patient was tachycardic, but other vital signs were within normal range, patient was on supplemental oxygen at 5 LPM with O2 sat of 91%.  Work-up in the ED on arrival to the ED showed H/H 18.0/52.4, MCV 90.5, platelets 258.  Showed sodium of 134, potassium 3.2, chloride 95, bicarb 22, glucose 116, BUN 22, creatinine 1.08 CT of head and CT cervical spine without contrast showed 1. Bilateral acute on chronic subdural hematomas. Left temporal acute subarachnoid hemorrhage. 2. Diffuse skeletal metastases involving the skull, cervical spine, and ribs. 3. Large right pleural effusion, increasing since prior PET-CT from 07/01/2021.   The patient continued to decline, now has significant altered mental status change, agonal breathing,--- family has agreed to hospice care  Anticipating in-hospital death   Assessment & Plan:        Principal Problem:   Acute on chronic intracranial subdural hematoma (HCC) Active Problems:   Fall at home, initial encounter   Primary breast cancer with metastasis to other site [facility])   Subarachnoid hemorrhage (HCC)   Pleural effusion on right   Assessment and plan:  1) status post fall with ICH-- Bil acute on chronic subdural hematomas, as well as Left temporal acute subarachnoid hemorrhage. -Continue comfort care  protocols -Currently encephalopathic, unresponsive   2)Metastatic Breast Cancer-no longer getting chemo, oncologist  -Transition to hospice  -Neuroimaging studies suggest diffuse skeletal metastases involving the skull, cervical spine, and ribs. -Continue comfort care protocols   3) social/ethics--- palliative consulted, hospice on board now -Patient is confirmed DNR/DNI -Transition to hospice care/comfort care per protocol       Discussed with patient and family.  Patient currently unconscious. Hospice on board agreed to convert the patient to GIP Anticipate in-hospital death   Consultants: Inpatient hospice Disposition: Hospice

## 2022-01-08 DIAGNOSIS — G9340 Encephalopathy, unspecified: Secondary | ICD-10-CM | POA: Diagnosis not present

## 2022-01-08 DIAGNOSIS — Z515 Encounter for palliative care: Principal | ICD-10-CM

## 2022-01-08 NOTE — Progress Notes (Signed)
PROGRESS NOTE    Patient: Caitlin Alexander                            PCP: Asencion Noble, MD                    DOB: 09-27-1935            DOA: 12/30/2021 WNU:[id number]             DOS: 01/08/2022, 12:07 PM   LOS: 1 day   Date of Service: The patient was seen and examined on 01/08/2022  Subjective:   The patient was seen and examined this morning. -Unresponsive, agonal breathing Otherwise comfortable  Anticipating in hospital death...   Brief Narrative:   Caitlin Alexander is a 86 y.o. female with medical history significant of metastatic breast cancer who is no longer receiving therapy (Oncologist had recommended transition to hospice care). and Anxiety disorder, as well as adult failure to thrive and recurrent falls admitted on 01/04/2022 with B bilateral acute on chronic subdural hematomas, as well as Left temporal acute subarachnoid hemorrhage. -In the setting of diffuse skeletal metastases involving the skull, cervical spine, and ribs. -Currently admitted under palliative/comfort care protocol -Awaiting official hospice consult for possible transfer to residential hospice house     ED course:  Patient was tachycardic, but other vital signs were within normal range, patient was on supplemental oxygen at 5 LPM with O2 sat of 91%.  Work-up in the ED on arrival to the ED showed H/H 18.0/52.4, MCV 90.5, platelets 258.  Showed sodium of 134, potassium 3.2, chloride 95, bicarb 22, glucose 116, BUN 22, creatinine 1.08 CT of head and CT cervical spine without contrast showed 1. Bilateral acute on chronic subdural hematomas. Left temporal acute subarachnoid hemorrhage. 2. Diffuse skeletal metastases involving the skull, cervical spine, and ribs. 3. Large right pleural effusion, increasing since prior PET-CT from 07/01/2021.   The patient continued to decline, now has significant altered mental status change, agonal breathing,--- family has agreed to hospice care  Anticipating in-hospital  death   Assessment & Plan:        Principal Problem:   Acute on chronic intracranial subdural hematoma (HCC) Active Problems:   Fall at home, initial encounter   Primary breast cancer with metastasis to other site [facility])   Subarachnoid hemorrhage (HCC)   Pleural effusion on right   Assessment and plan:  1) status post fall with ICH-- Bil acute on chronic subdural hematomas, as well as Left temporal acute subarachnoid hemorrhage. -Continue comfort care protocols -Currently encephalopathic, unresponsive   2)Metastatic Breast Cancer-no longer getting chemo, oncologist  -Transition to hospice  -Neuroimaging studies suggest diffuse skeletal metastases involving the skull, cervical spine, and ribs. -Continue comfort care protocols   3) social/ethics--- palliative consulted, hospice on board now -Patient is confirmed DNR/DNI -Transition to hospice care/comfort care per protocol       Discussed with patient and family.  Patient currently unconscious. Hospice on board agreed to convert the patient to GIP Anticipate in-hospital death   Consultants: Inpatient hospice Disposition: Hospice     ------------------------------------------------------------------------------------------------------------------------------------------------  DVT prophylaxis:  SCDs Start: 01/05/2022 1230   Code Status:   Code Status: DNR  Family Communication: No family member present at bedside this AM... they were at bed side last night-were updated Agree with hospice-current plan of care    Procedures:   No admission procedures for hospital encounter.  Antimicrobials:  Anti-infectives (From admission, onward)    None        Medication:   atropine  2 drop Sublingual TID   LORazepam  2 mg Sublingual QID   morphine CONCENTRATE  5 mg Sublingual QID   polyethylene glycol  17 g Oral Daily   [START ON 01/09/2022] scopolamine  1 patch Transdermal Q72H    acetaminophen, antiseptic  oral rinse, glycopyrrolate **OR** glycopyrrolate **OR** glycopyrrolate, haloperidol **OR** haloperidol **OR** haloperidol lactate, HYDROmorphone (DILAUDID) injection, LORazepam, morphine CONCENTRATE, ondansetron, polyvinyl alcohol, sorbitol   Objective:   Vitals:   01/08/22 0445  BP: 119/78  Pulse: (!) 107  Temp: 99.2 F (37.3 C)  TempSrc: Oral  SpO2: 94%    Intake/Output Summary (Last 24 hours) at 01/08/2022 1207 Last data filed at 01/08/2022 0448 Gross per 24 hour  Intake --  Output 525 ml  Net -525 ml   There were no vitals filed for this visit.   Examination:   Physical Exam  Constitution: Agonal breathing, unresponsive  HEENT:        with in  Normal limits  Chest:         Chest symmetric, barely moving Cardio vascular: RRR, No murmure, No Rubs or Gallops  pulmonary: Labored breathing, agonal breathing Abdomen: Soft, non-tender, mildly distended Muscular skeletal: Limited exam -comfortable in bed Neuro: Obtunded-Limited exam Extremities: No pitting edema lower extremities, +2 pulses  Skin: Dry, warm to touch, negative for any Rashes, No open wounds Wounds: per nursing documentation   ------------------------------------------------------------------------------------------------------------------------------------------    LABs:     Latest Ref Rng & Units 01/04/2022   12:38 AM 12/31/2021    4:08 PM 06/17/2021    1:02 PM  CBC  WBC 4.0 - 10.5 K/uL 8.8  5.9  7.9   Hemoglobin 12.0 - 15.0 g/dL 18.0  16.5  13.3   Hematocrit 36.0 - 46.0 % 52.4  48.7  41.2   Platelets 150 - 400 K/uL 258  265  322       Latest Ref Rng & Units 01/04/2022   12:38 AM 12/31/2021    4:08 PM 06/17/2021    1:02 PM  CMP  Glucose 70 - 99 mg/dL 116  116  96   BUN 8 - 23 mg/dL '22  21  11   '$ Creatinine 0.44 - 1.00 mg/dL 1.08  1.01  0.71   Sodium 135 - 145 mmol/L 134  134  140   Potassium 3.5 - 5.1 mmol/L 3.6  4.1  4.7   Chloride 98 - 111 mmol/L 95  99  106   CO2 22 - 32 mmol/L '22  25  27    '$ Calcium 8.9 - 10.3 mg/dL 9.9  9.6  8.9   Total Protein 6.5 - 8.1 g/dL  6.0  6.7   Total Bilirubin 0.3 - 1.2 mg/dL  1.2  0.5   Alkaline Phos 38 - 126 U/L  252  91   AST 15 - 41 U/L  43  26   ALT 0 - 44 U/L  18  20        Micro Results No results found for this or any previous visit (from the past 240 hour(s)).  Radiology Reports No results found.  SIGNED: Deatra James, MD, FHM. [facility],  Pager (please use [email or web address] to page/text) Please use Epic Secure Chat for non-urgent communication (7AM-7PM)  If 7PM-7AM, please contact night-coverage [email or web address], 01/08/2022, 12:07 PM

## 2022-01-09 DIAGNOSIS — Z515 Encounter for palliative care: Secondary | ICD-10-CM | POA: Diagnosis not present

## 2022-01-09 NOTE — Plan of Care (Signed)
  Problem: Respiratory: Goal: Verbalizations of increased ease of respirations will increase Outcome: Not Progressing   

## 2022-01-09 NOTE — Progress Notes (Signed)
PROGRESS NOTE    Patient: Caitlin Alexander                            PCP: Asencion Noble, MD                    DOB: 1935-09-07            DOA: 01/14/2022 PIR:[id number]             DOS: 01/09/2022, 12:01 PM   LOS: 2 days   Date of Service: The patient was seen and examined on 01/09/2022  Subjective:  The patient was seen and examined, comfortable laying in bed, completely obtunded, not responding to verbal stimuli or pain stimuli-labored breathing  Anticipating in hospital death...   Brief Narrative:   Caitlin Alexander is a 86 y.o. female with medical history significant of metastatic breast cancer who is no longer receiving therapy (Oncologist had recommended transition to hospice care). and Anxiety disorder, as well as adult failure to thrive and recurrent falls admitted on 01/04/2022 with B bilateral acute on chronic subdural hematomas, as well as Left temporal acute subarachnoid hemorrhage. -In the setting of diffuse skeletal metastases involving the skull, cervical spine, and ribs. -Currently admitted under palliative/comfort care protocol -Awaiting official hospice consult for possible transfer to residential hospice house     ED course:  Patient was tachycardic, but other vital signs were within normal range, patient was on supplemental oxygen at 5 LPM with O2 sat of 91%.  Work-up in the ED on arrival to the ED showed H/H 18.0/52.4, MCV 90.5, platelets 258.  Showed sodium of 134, potassium 3.2, chloride 95, bicarb 22, glucose 116, BUN 22, creatinine 1.08 CT of head and CT cervical spine without contrast showed 1. Bilateral acute on chronic subdural hematomas. Left temporal acute subarachnoid hemorrhage. 2. Diffuse skeletal metastases involving the skull, cervical spine, and ribs. 3. Large right pleural effusion, increasing since prior PET-CT from 07/01/2021.   The patient continued to decline, now has significant altered mental status change, agonal breathing,--- family has agreed  to hospice care  Anticipating in-hospital death   Assessment & Plan:        Principal Problem:   Acute on chronic intracranial subdural hematoma (HCC) Active Problems:   Fall at home, initial encounter   Primary breast cancer with metastasis to other site [facility])   Subarachnoid hemorrhage (HCC)   Pleural effusion on right   Assessment and plan:  1) status post fall with ICH-- Bil acute on chronic subdural hematomas, as well as Left temporal acute subarachnoid hemorrhage. -Continue comfort care protocols -Currently encephalopathic, unresponsive   2)Metastatic Breast Cancer-no longer getting chemo, oncologist  -Transition to hospice  -Neuroimaging studies suggest diffuse skeletal metastases involving the skull, cervical spine, and ribs. -Continue comfort care protocols   3) social/ethics--- palliative consulted, hospice on board now -Patient is confirmed DNR/DNI -Transition to hospice care/comfort care per protocol       Discussed with patient and family.  Patient currently unconscious. Hospice on board agreed to convert the patient to GIP Anticipate in-hospital death   Consultants: Inpatient hospice Disposition: Hospice     ------------------------------------------------------------------------------------------------------------------------------------------------  DVT prophylaxis:  SCDs Start: 01/08/2022 1230   Code Status:   Code Status: DNR  Family Communication: No family member present at bedside this AM... they were at bed side last night-were updated Agree with hospice-current plan of care    Procedures:   No  admission procedures for hospital encounter.   Antimicrobials:  Anti-infectives (From admission, onward)    None        Medication:   atropine  2 drop Sublingual TID   LORazepam  2 mg Sublingual QID   morphine CONCENTRATE  5 mg Sublingual QID   polyethylene glycol  17 g Oral Daily   scopolamine  1 patch Transdermal Q72H     acetaminophen, antiseptic oral rinse, glycopyrrolate **OR** glycopyrrolate **OR** glycopyrrolate, haloperidol **OR** haloperidol **OR** haloperidol lactate, HYDROmorphone (DILAUDID) injection, LORazepam, morphine CONCENTRATE, ondansetron, polyvinyl alcohol, sorbitol   Objective:   Vitals:   01/08/22 0445 01/08/22 1552  BP: 119/78 129/85  Pulse: (!) 107 (!) 102  Resp:  20  Temp: 99.2 F (37.3 C) 97.9 F (36.6 C)  TempSrc: Oral Oral  SpO2: 94% 95%    Intake/Output Summary (Last 24 hours) at 01/09/2022 1201 Last data filed at 01/09/2022 0425 Gross per 24 hour  Intake 0 ml  Output 400 ml  Net -400 ml   There were no vitals filed for this visit.   Examination:   Physical Exam  Not responding, obtunded, agonal breathing Constitution: Labored breathing, unconscious Chest:         Minimum rise of chest noted  - chest symmetric, barely moving Cardio vascular: Tachycardic  no Rubs or Gallops  pulmonary: Labored breathing, agonal breathing Abdomen: Soft, non-tender, mildly distended Muscular skeletal: Limited exam -comfortable in bed Neuro: Obtunded-Limited exam Extremities: No pitting edema lower extremities, +2 pulses  Skin: Dry, warm to touch, negative for any Rashes, No open wounds Wounds: per nursing documentation   ------------------------------------------------------------------------------------------------------------------------------------------    LABs:     Latest Ref Rng & Units 01/04/2022   12:38 AM 12/31/2021    4:08 PM 06/17/2021    1:02 PM  CBC  WBC 4.0 - 10.5 K/uL 8.8  5.9  7.9   Hemoglobin 12.0 - 15.0 g/dL 18.0  16.5  13.3   Hematocrit 36.0 - 46.0 % 52.4  48.7  41.2   Platelets 150 - 400 K/uL 258  265  322       Latest Ref Rng & Units 01/04/2022   12:38 AM 12/31/2021    4:08 PM 06/17/2021    1:02 PM  CMP  Glucose 70 - 99 mg/dL 116  116  96   BUN 8 - 23 mg/dL '22  21  11   '$ Creatinine 0.44 - 1.00 mg/dL 1.08  1.01  0.71   Sodium 135 - 145  mmol/L 134  134  140   Potassium 3.5 - 5.1 mmol/L 3.6  4.1  4.7   Chloride 98 - 111 mmol/L 95  99  106   CO2 22 - 32 mmol/L '22  25  27   '$ Calcium 8.9 - 10.3 mg/dL 9.9  9.6  8.9   Total Protein 6.5 - 8.1 g/dL  6.0  6.7   Total Bilirubin 0.3 - 1.2 mg/dL  1.2  0.5   Alkaline Phos 38 - 126 U/L  252  91   AST 15 - 41 U/L  43  26   ALT 0 - 44 U/L  18  20        Micro Results No results found for this or any previous visit (from the past 240 hour(s)).  Radiology Reports No results found.  SIGNED: Deatra James, MD, FHM. [facility],  Pager (please use [email or web address] to page/text) Please use Epic Secure Chat for non-urgent communication (7AM-7PM)  If 7PM-7AM, please contact night-coverage  [email or web address], 01/09/2022, 12:01 PM

## 2022-01-09 NOTE — Progress Notes (Signed)
Patient wont open mouth to give medication, '1mg'$  of ativan wasted, returned the other 1 mg, pyxis didn't open to return but was noted to be returned. Inventory count done with Charge nurse Donney Rankins to correct ativan count of 3 tablets left.

## 2022-01-10 DIAGNOSIS — Z515 Encounter for palliative care: Principal | ICD-10-CM

## 2022-01-26 NOTE — Discharge Summary (Signed)
Death Summary  Caitlin Alexander PTW:[id number] DOB: 08/04/1935 DOA: 2022-01-23  PCP: Asencion Noble, MD  Admit date: 01/23/22 Date of Death: 01/27/22 Time of Death: 0417 Notification: Asencion Noble, MD notified of death of 01-27-22     Principal Problem:   Acute on chronic intracranial subdural hematoma [facility]) Active Problems:   Fall at home, initial encounter   Primary breast cancer with metastasis to other site [facility])   Subarachnoid hemorrhage ([place])   Pleural effusion on right   Caitlin Alexander is a 86 y.o. female with medical history significant of metastatic breast cancer who is no longer receiving therapy (Oncologist had recommended transition to hospice care). and Anxiety disorder, as well as adult failure to thrive and recurrent falls admitted on 01/04/2022 with B bilateral acute on chronic subdural hematomas, as well as Left temporal acute subarachnoid hemorrhage. -In the setting of diffuse skeletal metastases involving the skull, cervical spine, and ribs. -Currently admitted under palliative/comfort care protocol -Awaiting official hospice consult for possible transfer to residential hospice house     The patient continued to decline, now has significant altered mental status change, agonal breathing,--- family has agreed to hospice care   - in-hospital death     1) status post fall with ICH-- Bil acute on chronic subdural hematomas, as well as Left temporal acute subarachnoid hemorrhage. -Continue comfort care protocols -Currently encephalopathic, unresponsive   2)Metastatic Breast Cancer-no longer getting chemo, oncologist  -Transition to hospice   -Neuroimaging studies suggest diffuse skeletal metastases involving the skull, cervical spine, and ribs. -Continue comfort care protocols   3) social/ethics--- palliative consulted, hospice on board now -Patient is confirmed DNR/DNI -Transition to hospice care/comfort care per protocol         Discussed with patient  and family.  Patient currently unconscious. Hospice on board agreed to convert the patient to GIP Anticipate in-hospital death   Consultants: Inpatient hospice Disposition: Hospice    Disposition/Follow up Care: Patient is deceased.   Discharge medications: None  The results of significant diagnostics from this hospitalization (including imaging, microbiology, ancillary and laboratory) are listed below for reference.    Significant Diagnostic Studies: CT Head Wo Contrast  Result Date: 01/04/2022 CLINICAL DATA:  Blunt facial trauma. [place] today and yesterday x3. History of metastatic cancer. EXAM: CT HEAD WITHOUT CONTRAST CT CERVICAL SPINE WITHOUT CONTRAST TECHNIQUE: Multidetector CT imaging of the head and cervical spine was performed following the standard protocol without intravenous contrast. Multiplanar CT image reconstructions of the cervical spine were also generated. RADIATION DOSE REDUCTION: This exam was performed according to the departmental dose-optimization program which includes automated exposure control, adjustment of the mA and/or kV according to patient size and/or use of iterative reconstruction technique. COMPARISON:  None Available. FINDINGS: CT HEAD FINDINGS Brain: Bilateral subdural hematomas with mixed density appearance consistent with acute on chronic subdural hematomas. Right subdural hematoma measures 5 mm depth. Left subdural measures 5 mm depth. Left temporal subarachnoid hemorrhage. Diffuse cerebral atrophy. Ventricular dilatation consistent with central atrophy. Low-attenuation changes in the deep white matter consistent small vessel ischemia. No midline shift. Vascular: No hyperdense vessel or unexpected calcification. Skull: Multiple lucent and destructive bone lesions throughout the calvarium consistent with metastasis. Sinuses/Orbits: Paranasal sinuses are clear. Bilateral mastoid effusions. Other: None. CT CERVICAL SPINE FINDINGS Alignment: Normal alignment.  Skull base and vertebrae: Destructive bone lesions demonstrated in the skull base and in multiple vertebra involving vertebral body and posterior elements. No vertebral compression deformities. Soft tissues and spinal canal: No prevertebral soft  tissue swelling. No paraspinal soft tissue mass lesions are identified. Disc levels:  Intervertebral disc space heights are mostly normal. Upper chest: Large right pleural effusion. Multiple destructive rib bone lesions. Other: None. IMPRESSION: 1. Bilateral acute on chronic subdural hematomas. Left temporal acute subarachnoid hemorrhage. 2. Diffuse skeletal metastases involving the skull, cervical spine, and ribs. 3. Large right pleural effusion, increasing since prior PET-CT from 07/01/2021 Critical Value/emergent results were called by telephone at the time of interpretation on 01/03/2022 at 11:52 pm to provider Veryl Speak , who verbally acknowledged these results. Electronically Signed   By: Lucienne Capers M.D.   On: 01/04/2022 00:02   CT Cervical Spine Wo Contrast  Result Date: 01/04/2022 CLINICAL DATA:  Blunt facial trauma. [place] today and yesterday x3. History of metastatic cancer. EXAM: CT HEAD WITHOUT CONTRAST CT CERVICAL SPINE WITHOUT CONTRAST TECHNIQUE: Multidetector CT imaging of the head and cervical spine was performed following the standard protocol without intravenous contrast. Multiplanar CT image reconstructions of the cervical spine were also generated. RADIATION DOSE REDUCTION: This exam was performed according to the departmental dose-optimization program which includes automated exposure control, adjustment of the mA and/or kV according to patient size and/or use of iterative reconstruction technique. COMPARISON:  None Available. FINDINGS: CT HEAD FINDINGS Brain: Bilateral subdural hematomas with mixed density appearance consistent with acute on chronic subdural hematomas. Right subdural hematoma measures 5 mm depth. Left subdural measures 5 mm  depth. Left temporal subarachnoid hemorrhage. Diffuse cerebral atrophy. Ventricular dilatation consistent with central atrophy. Low-attenuation changes in the deep white matter consistent small vessel ischemia. No midline shift. Vascular: No hyperdense vessel or unexpected calcification. Skull: Multiple lucent and destructive bone lesions throughout the calvarium consistent with metastasis. Sinuses/Orbits: Paranasal sinuses are clear. Bilateral mastoid effusions. Other: None. CT CERVICAL SPINE FINDINGS Alignment: Normal alignment. Skull base and vertebrae: Destructive bone lesions demonstrated in the skull base and in multiple vertebra involving vertebral body and posterior elements. No vertebral compression deformities. Soft tissues and spinal canal: No prevertebral soft tissue swelling. No paraspinal soft tissue mass lesions are identified. Disc levels:  Intervertebral disc space heights are mostly normal. Upper chest: Large right pleural effusion. Multiple destructive rib bone lesions. Other: None. IMPRESSION: 1. Bilateral acute on chronic subdural hematomas. Left temporal acute subarachnoid hemorrhage. 2. Diffuse skeletal metastases involving the skull, cervical spine, and ribs. 3. Large right pleural effusion, increasing since prior PET-CT from 07/01/2021 Critical Value/emergent results were called by telephone at the time of interpretation on 01/03/2022 at 11:52 pm to provider Veryl Speak , who verbally acknowledged these results. Electronically Signed   By: Lucienne Capers M.D.   On: 01/04/2022 00:02   CT Head Wo Contrast  Result Date: 12/31/2021 CLINICAL DATA:  Trauma, multiple falls EXAM: CT HEAD WITHOUT CONTRAST TECHNIQUE: Contiguous axial images were obtained from the base of the skull through the vertex without intravenous contrast. RADIATION DOSE REDUCTION: This exam was performed according to the departmental dose-optimization program which includes automated exposure control, adjustment of the mA  and/or kV according to patient size and/or use of iterative reconstruction technique. COMPARISON:  MR brain done on 07/23/2021 FINDINGS: Brain: No acute intracranial findings are seen in noncontrast CT brain. There are no signs of bleeding within the cranium. Ventricles are not dilated. Cortical sulci are prominent. There is decreased density in periventricular white matter. Vascular: Unremarkable. Skull: There are multiple lytic lesions of varying sizes scattered throughout calvarium. There is interval increase in number and size of the lytic lesions since 07/23/2021. Sinuses/Orbits:  There are no air-fluid levels in paranasal sinuses. No focal abnormalities are seen in orbits. Other: None. IMPRESSION: No acute intracranial findings are seen in noncontrast CT brain. Atrophy. There are multiple lytic lesions of varying sizes scattered throughout calvarium suggesting skeletal metastatic disease with interval worsening. Electronically Signed   By: Elmer Picker M.D.   On: 12/31/2021 17:07   DG Elbow Complete Right  Result Date: 12/31/2021 CLINICAL DATA:  Fall EXAM: RIGHT ELBOW - COMPLETE 3+ VIEW COMPARISON:  None Available. FINDINGS: No acute fracture or malalignment.  No significant elbow effusion. IMPRESSION: No acute osseous abnormality Electronically Signed   By: Donavan Foil M.D.   On: 12/31/2021 16:34   DG Elbow Complete Left  Result Date: 12/31/2021 CLINICAL DATA:  Fall EXAM: LEFT ELBOW - COMPLETE 3+ VIEW COMPARISON:  None Available. FINDINGS: No fracture or malalignment.  No significant elbow effusion IMPRESSION: No acute osseous abnormality Electronically Signed   By: Donavan Foil M.D.   On: 12/31/2021 16:33   DG Chest Port 1 View  Result Date: 12/31/2021 CLINICAL DATA:  Fall. EXAM: PORTABLE CHEST 1 VIEW COMPARISON:  June 30, 2015. FINDINGS: Moderate to large right pleural effusion is noted with associated right basilar atelectasis or infiltrate. Left lung is clear. Visualized portion of  cardiomediastinal silhouette is unremarkable. Patient is rotated to the right. Visualized bony thorax is unremarkable. IMPRESSION: Moderate to large right pleural effusion is noted with associated right basilar atelectasis or infiltrate. Electronically Signed   By: Marijo Conception M.D.   On: 12/31/2021 16:33    Microbiology: No results found for this or any previous visit (from the past 240 hour(s)).   Labs: Basic Metabolic Panel: No results for input(s): "NA", "K", "CL", "CO2", "GLUCOSE", "BUN", "CREATININE", "CALCIUM", "MG", "PHOS" in the last 168 hours. Liver Function Tests: No results for input(s): "AST", "ALT", "ALKPHOS", "BILITOT", "PROT", "ALBUMIN" in the last 168 hours. No results for input(s): "LIPASE", "AMYLASE" in the last 168 hours. No results for input(s): "AMMONIA" in the last 168 hours. CBC: No results for input(s): "WBC", "NEUTROABS", "HGB", "HCT", "MCV", "PLT" in the last 168 hours. Cardiac Enzymes: No results for input(s): "CKTOTAL", "CKMB", "CKMBINDEX", "TROPONINI" in the last 168 hours. D-Dimer No results for input(s): "DDIMER" in the last 72 hours. BNP: Invalid input(s): "POCBNP" CBG: No results for input(s): "GLUCAP" in the last 168 hours. Anemia work up No results for input(s): "VITAMINB12", "FOLATE", "FERRITIN", "TIBC", "IRON", "RETICCTPCT" in the last 72 hours. Urinalysis    Component Value Date/Time   COLORURINE YELLOW 11/03/2011 [place] 11/03/2011 1155   LABSPEC 1.015 11/03/2011 1155   PHURINE 6.0 11/03/2011 1155   GLUCOSEU NEGATIVE 11/03/2011 1155   HGBUR TRACE (A) 11/03/2011 1155   BILIRUBINUR NEGATIVE 11/03/2011 1155   KETONESUR 40 (A) 11/03/2011 1155   PROTEINUR NEGATIVE 11/03/2011 1155   UROBILINOGEN 0.2 11/03/2011 1155   NITRITE NEGATIVE 11/03/2011 1155   LEUKOCYTESUR NEGATIVE 11/03/2011 1155   Sepsis Labs No results for input(s): "WBC" in the last 168 hours.  Invalid input(s): "PROCALCITONIN", "LACTICIDVEN"  I have spent  40  minutes face to face encounter with the patient and on the ward discussing the patients care, assessment, plan and disposition with other care givers. >50% of the time was devoted  coordinating care.    SIGNED:  Deatra James, MD  [facility] 01/11/2022, 1:43 PM Pager   If 7PM-7AM, please contact night-coverage [email or web address] Password [email or web address]

## 2022-01-26 NOTE — Progress Notes (Signed)
At [phone] patient passed away, nurse at bedside, second verified by Cyndra Numbers RN, notified  son Deborra Phegley and Dr. Orlin Hilding.   At 0545 patient to morgue  by security.

## 2022-01-26 DEATH — deceased
